# Patient Record
Sex: Female | Born: 1965 | Race: Black or African American | Hispanic: No | Marital: Single | Smoking: Never smoker
Health system: Southern US, Community
[De-identification: ages and names within clinical notes are randomized; demographics above are authoritative.]

## PROBLEM LIST (undated history)

## (undated) DIAGNOSIS — I10 Essential (primary) hypertension: Secondary | ICD-10-CM

## (undated) DIAGNOSIS — E7800 Pure hypercholesterolemia, unspecified: Secondary | ICD-10-CM

## (undated) DIAGNOSIS — E063 Autoimmune thyroiditis: Secondary | ICD-10-CM

## (undated) DIAGNOSIS — IMO0001 Reserved for inherently not codable concepts without codable children: Secondary | ICD-10-CM

## (undated) DIAGNOSIS — F329 Major depressive disorder, single episode, unspecified: Secondary | ICD-10-CM

## (undated) DIAGNOSIS — E109 Type 1 diabetes mellitus without complications: Secondary | ICD-10-CM

## (undated) DIAGNOSIS — M869 Osteomyelitis, unspecified: Secondary | ICD-10-CM

## (undated) DIAGNOSIS — E039 Hypothyroidism, unspecified: Secondary | ICD-10-CM

## (undated) DIAGNOSIS — Z5189 Encounter for other specified aftercare: Secondary | ICD-10-CM

## (undated) DIAGNOSIS — G4730 Sleep apnea, unspecified: Secondary | ICD-10-CM

## (undated) DIAGNOSIS — N182 Chronic kidney disease, stage 2 (mild): Secondary | ICD-10-CM

## (undated) DIAGNOSIS — D509 Iron deficiency anemia, unspecified: Secondary | ICD-10-CM

## (undated) DIAGNOSIS — M1990 Unspecified osteoarthritis, unspecified site: Secondary | ICD-10-CM

## (undated) DIAGNOSIS — R51 Headache: Secondary | ICD-10-CM

## (undated) DIAGNOSIS — M169 Osteoarthritis of hip, unspecified: Secondary | ICD-10-CM

## (undated) DIAGNOSIS — K219 Gastro-esophageal reflux disease without esophagitis: Secondary | ICD-10-CM

## (undated) DIAGNOSIS — M14679 Charcot's joint, unspecified ankle and foot: Secondary | ICD-10-CM

## (undated) DIAGNOSIS — I471 Supraventricular tachycardia, unspecified: Secondary | ICD-10-CM

## (undated) DIAGNOSIS — F419 Anxiety disorder, unspecified: Secondary | ICD-10-CM

## (undated) DIAGNOSIS — F32A Depression, unspecified: Secondary | ICD-10-CM

## (undated) DIAGNOSIS — G904 Autonomic dysreflexia: Secondary | ICD-10-CM

## (undated) DIAGNOSIS — G4733 Obstructive sleep apnea (adult) (pediatric): Secondary | ICD-10-CM

## (undated) DIAGNOSIS — E785 Hyperlipidemia, unspecified: Secondary | ICD-10-CM

## (undated) DIAGNOSIS — Z6841 Body Mass Index (BMI) 40.0 and over, adult: Secondary | ICD-10-CM

## (undated) DIAGNOSIS — Z9989 Dependence on other enabling machines and devices: Secondary | ICD-10-CM

## (undated) DIAGNOSIS — E079 Disorder of thyroid, unspecified: Secondary | ICD-10-CM

## (undated) DIAGNOSIS — N184 Chronic kidney disease, stage 4 (severe): Secondary | ICD-10-CM

## (undated) HISTORY — PX: CATARACT EXTRACTION W/ INTRAOCULAR LENS  IMPLANT, BILATERAL: SHX1307

## (undated) HISTORY — PX: LEG AMPUTATION BELOW KNEE: SHX694

## (undated) HISTORY — DX: Disorder of thyroid, unspecified: E07.9

## (undated) HISTORY — DX: Essential (primary) hypertension: I10

## (undated) HISTORY — DX: Depression, unspecified: F32.A

## (undated) HISTORY — PX: THYROIDECTOMY: SHX17

## (undated) HISTORY — PX: OTHER SURGICAL HISTORY: SHX169

---

## 1991-03-03 DIAGNOSIS — M869 Osteomyelitis, unspecified: Secondary | ICD-10-CM

## 1991-03-03 HISTORY — DX: Osteomyelitis, unspecified: M86.9

## 1991-03-03 HISTORY — PX: BELOW KNEE LEG AMPUTATION: SUR23

## 1995-03-03 DIAGNOSIS — E063 Autoimmune thyroiditis: Secondary | ICD-10-CM

## 1995-03-03 HISTORY — DX: Autoimmune thyroiditis: E06.3

## 1995-03-03 HISTORY — PX: THYROIDECTOMY: SHX17

## 1996-03-02 HISTORY — PX: OTHER SURGICAL HISTORY: SHX169

## 1998-03-02 HISTORY — PX: BREAST LUMPECTOMY: SHX2

## 2000-03-02 DIAGNOSIS — I471 Supraventricular tachycardia, unspecified: Secondary | ICD-10-CM

## 2000-03-02 HISTORY — DX: Supraventricular tachycardia, unspecified: I47.10

## 2000-03-02 HISTORY — PX: SUPRAVENTRICULAR TACHYCARDIA ABLATION: SHX6106

## 2000-03-02 HISTORY — DX: Supraventricular tachycardia: I47.1

## 2001-03-02 HISTORY — PX: CARPAL TUNNEL RELEASE: SHX101

## 2002-03-02 HISTORY — PX: RETINAL DETACHMENT REPAIR W/ SCLERAL BUCKLE LE: SHX2338

## 2007-10-24 ENCOUNTER — Ambulatory Visit: Payer: Self-pay | Admitting: Licensed Clinical Social Worker

## 2007-10-25 ENCOUNTER — Ambulatory Visit: Payer: Self-pay | Admitting: Endocrinology

## 2007-10-25 DIAGNOSIS — E063 Autoimmune thyroiditis: Secondary | ICD-10-CM

## 2007-10-25 DIAGNOSIS — S88119A Complete traumatic amputation at level between knee and ankle, unspecified lower leg, initial encounter: Secondary | ICD-10-CM | POA: Insufficient documentation

## 2007-10-25 DIAGNOSIS — S82409A Unspecified fracture of shaft of unspecified fibula, initial encounter for closed fracture: Secondary | ICD-10-CM | POA: Insufficient documentation

## 2007-10-25 DIAGNOSIS — I498 Other specified cardiac arrhythmias: Secondary | ICD-10-CM

## 2007-10-25 DIAGNOSIS — L089 Local infection of the skin and subcutaneous tissue, unspecified: Secondary | ICD-10-CM | POA: Insufficient documentation

## 2007-10-25 LAB — CONVERTED CEMR LAB
ALT: 27 units/L (ref 0–35)
Albumin: 3.6 g/dL (ref 3.5–5.2)
Calcium: 9.2 mg/dL (ref 8.4–10.5)
Chloride: 111 meq/L (ref 96–112)
Creatinine, Ser: 1.9 mg/dL — ABNORMAL HIGH (ref 0.4–1.2)
GFR calc Af Amer: 37 mL/min
GFR calc non Af Amer: 31 mL/min
Hgb A1c MFr Bld: 7.6 % — ABNORMAL HIGH (ref 4.6–6.0)
Potassium: 4.5 meq/L (ref 3.5–5.1)
Total CHOL/HDL Ratio: 4.1
Total Protein: 8 g/dL (ref 6.0–8.3)

## 2007-10-30 DIAGNOSIS — F329 Major depressive disorder, single episode, unspecified: Secondary | ICD-10-CM

## 2007-10-30 DIAGNOSIS — E1140 Type 2 diabetes mellitus with diabetic neuropathy, unspecified: Secondary | ICD-10-CM

## 2007-10-30 DIAGNOSIS — E785 Hyperlipidemia, unspecified: Secondary | ICD-10-CM

## 2007-10-30 DIAGNOSIS — M1990 Unspecified osteoarthritis, unspecified site: Secondary | ICD-10-CM | POA: Insufficient documentation

## 2007-10-30 DIAGNOSIS — E039 Hypothyroidism, unspecified: Secondary | ICD-10-CM | POA: Insufficient documentation

## 2007-10-30 DIAGNOSIS — M1460 Charcot's joint, unspecified site: Secondary | ICD-10-CM | POA: Insufficient documentation

## 2007-10-30 DIAGNOSIS — I119 Hypertensive heart disease without heart failure: Secondary | ICD-10-CM

## 2007-10-30 DIAGNOSIS — E108 Type 1 diabetes mellitus with unspecified complications: Secondary | ICD-10-CM

## 2007-10-30 DIAGNOSIS — F3289 Other specified depressive episodes: Secondary | ICD-10-CM | POA: Insufficient documentation

## 2007-11-04 ENCOUNTER — Ambulatory Visit: Payer: Self-pay | Admitting: Licensed Clinical Social Worker

## 2007-11-10 ENCOUNTER — Encounter: Payer: Self-pay | Admitting: Endocrinology

## 2007-11-16 ENCOUNTER — Telehealth: Payer: Self-pay | Admitting: Endocrinology

## 2007-11-18 ENCOUNTER — Ambulatory Visit: Payer: Self-pay | Admitting: Licensed Clinical Social Worker

## 2007-11-24 ENCOUNTER — Ambulatory Visit: Payer: Self-pay | Admitting: Endocrinology

## 2007-12-05 ENCOUNTER — Encounter: Payer: Self-pay | Admitting: Endocrinology

## 2008-02-09 ENCOUNTER — Encounter: Payer: Self-pay | Admitting: Endocrinology

## 2008-02-14 ENCOUNTER — Telehealth (INDEPENDENT_AMBULATORY_CARE_PROVIDER_SITE_OTHER): Payer: Self-pay | Admitting: *Deleted

## 2008-03-08 ENCOUNTER — Ambulatory Visit: Payer: Self-pay | Admitting: Endocrinology

## 2008-03-08 LAB — CONVERTED CEMR LAB: Hgb A1c MFr Bld: 8 % — ABNORMAL HIGH (ref 4.6–6.0)

## 2008-03-30 ENCOUNTER — Encounter: Payer: Self-pay | Admitting: Endocrinology

## 2008-04-13 ENCOUNTER — Emergency Department (HOSPITAL_COMMUNITY): Admission: EM | Admit: 2008-04-13 | Discharge: 2008-04-14 | Payer: Self-pay | Admitting: Emergency Medicine

## 2008-05-29 ENCOUNTER — Encounter: Admission: RE | Admit: 2008-05-29 | Discharge: 2008-05-29 | Payer: Self-pay | Admitting: Neurology

## 2008-06-23 ENCOUNTER — Encounter: Admission: RE | Admit: 2008-06-23 | Discharge: 2008-06-23 | Payer: Self-pay | Admitting: Sports Medicine

## 2008-06-29 ENCOUNTER — Encounter: Payer: Self-pay | Admitting: Neurology

## 2008-09-07 ENCOUNTER — Encounter: Admission: RE | Admit: 2008-09-07 | Discharge: 2008-09-07 | Payer: Self-pay | Admitting: Obstetrics and Gynecology

## 2008-10-07 ENCOUNTER — Inpatient Hospital Stay (HOSPITAL_COMMUNITY): Admission: EM | Admit: 2008-10-07 | Discharge: 2008-10-09 | Payer: Self-pay | Admitting: Emergency Medicine

## 2008-12-04 ENCOUNTER — Telehealth (INDEPENDENT_AMBULATORY_CARE_PROVIDER_SITE_OTHER): Payer: Self-pay | Admitting: *Deleted

## 2009-03-02 HISTORY — PX: JOINT REPLACEMENT: SHX530

## 2009-03-02 HISTORY — PX: TOTAL HIP ARTHROPLASTY: SHX124

## 2009-03-11 ENCOUNTER — Telehealth: Payer: Self-pay | Admitting: Endocrinology

## 2009-04-16 ENCOUNTER — Encounter: Payer: Self-pay | Admitting: Endocrinology

## 2009-06-04 ENCOUNTER — Encounter: Admission: RE | Admit: 2009-06-04 | Discharge: 2009-06-04 | Payer: Self-pay | Admitting: Internal Medicine

## 2009-06-25 ENCOUNTER — Telehealth: Payer: Self-pay | Admitting: Endocrinology

## 2009-07-01 ENCOUNTER — Encounter: Payer: Self-pay | Admitting: Endocrinology

## 2009-08-12 ENCOUNTER — Inpatient Hospital Stay (HOSPITAL_COMMUNITY): Admission: RE | Admit: 2009-08-12 | Discharge: 2009-08-16 | Payer: Self-pay | Admitting: Orthopedic Surgery

## 2009-10-30 ENCOUNTER — Telehealth: Payer: Self-pay | Admitting: Endocrinology

## 2010-01-10 ENCOUNTER — Ambulatory Visit (HOSPITAL_COMMUNITY): Admission: RE | Admit: 2010-01-10 | Discharge: 2010-01-10 | Payer: Self-pay | Admitting: Obstetrics & Gynecology

## 2010-03-02 HISTORY — PX: CERVICAL POLYPECTOMY: SHX88

## 2010-03-24 ENCOUNTER — Encounter: Payer: Self-pay | Admitting: Interventional Radiology

## 2010-03-27 NOTE — Op Note (Addendum)
  NAME:  [name], [name]                ACCOUNT NO.:  [id number]  MEDICAL RECORD NO.:  [id number]          PATIENT TYPE:  AMB  LOCATION:  SDC                           FACILITY:  [facility]  PHYSICIAN:  Freddy Finner, M.D.   DATE OF BIRTH:  11/30/1965  DATE OF PROCEDURE:  01/10/2010 DATE OF DISCHARGE:  01/10/2010                              OPERATIVE REPORT   PREOPERATIVE DIAGNOSIS:  Postmenopausal bleeding, endometrial thickening.  POSTOPERATIVE DIAGNOSIS:  Endometrial polyps.  OPERATIVE PROCEDURE:  Hysteroscopy D and C, resection of endometrial polyps.  SURGEON:  Freddy Finner, MD  ANESTHESIA:  General.  ESTIMATED INTRAOPERATIVE BLOOD LOSS:  10 mL.  SORBITOL DEFICIT:  30 mL.  INTRAOPERATIVE COMPLICATIONS:  None.  The patient is a 45 year old who has had an episode of postmenopausal bleeding and was found on ultrasound in the office to have endometrial thickening.  She is admitted now for hysteroscopy D and C.  She was given a bolus of Ancef preoperatively.  She was brought to the operating room.  She was there placed under adequate general anesthesia, placed in dorsal lithotomy position.  Betadine prep of the mons, perineum, and vagina was carried out in the usual fashion.  Sterile drapes were applied.  Pederson vaginal speculum was introduced.  Cervix was visualized, grasped on the anterior lip with a single-tooth tenaculum. An os finder was then used to dilate the cervix and checked the uterine length which was 9 cm.  Cervix was grossly dilated to 23 with [facility] dilators.  A curettage was performed initially because of some concern that we could complete the hysteroscopy and wanting to have a tissue sample.  The procedure was technically difficult because of nulliparous cervix and vaginal and cervical atrophy or infantile appearance.  An approximately 2 cm x 5 mm polyp was extracted.  The hysteroscope was then introduced and successfully without complication and another  polyp was identified which was eventually removed using a sharp curette and the Randall stone forceps.  Repeat inspection revealed resection of all visible polyps.  Photographs were made and retained in the office record.  The procedure was then terminated.  The instruments were removed.  The patient was awakened and taken to recovery room in good condition.     Freddy Finner, M.D.     WRN/MEDQ  D:  01/10/2010  T:  01/11/2010  Job:  [id number]  Electronically Signed by Lalitha Ilyas. Dorice Stiggers M.D. on 03/27/2010 08:51:59 AM

## 2010-04-01 NOTE — Progress Notes (Signed)
  Phone Note Refill Request Message from:  Fax from Pharmacy on March 11, 2009 11:35 AM  Refills Requested: Medication #1:  ONETOUCH ULTRA TEST  STRP 6/day   Dosage confirmed as above?Dosage Confirmed Initial call taken by: Josph Macho CMA,  March 11, 2009 11:35 AM    Prescriptions: [place]n Bound TEST  STRP (GLUCOSE BLOOD) 6/day, variable glucoses, 250.03, insulin pump (and lancets)  #200 Each x 3   Entered by:   Josph Macho CMA   Authorized by:   Minus Breeding MD   Signed by:   Josph Macho CMA on 03/11/2009   Method used:   Electronically to        [facility]  [phone]* (retail)       [address]       [place], [place]  [address]       Ph: [phone] or [phone]       Fax: [phone]   RxID:   [id number]

## 2010-04-01 NOTE — Letter (Signed)
Summary: Generic Letter  [facility]  [address] [place], [place] [address]   Phone: [phone]  Fax: [phone]    07/01/2009  [name] [address] [place], [place]  [address]  Dear Ms. [name],    Please contact our office. Dr Everardo All is requesting that you schedule a follow up appointment soon to be able to provide you with the best care. If you have any questions about this please call the office.       Sincerely,   Lamar Sprinkles, CMA (AAMA)

## 2010-04-01 NOTE — Progress Notes (Signed)
Summary: test strip pa  Phone Note From Pharmacy   Details of Request: [facility] [phone] (Not available online.) Summary of Call: PA request--oen touch test strips. Patient dirrections state to use 6x/day, would you like patient to continue that regimen? Please advise. Initial call taken by: Lucious Groves,  June 25, 2009 11:16 AM  Follow-up for Phone Call        it has been 1 1/2 years since last ov. ov would be needed to consider Follow-up by: Minus Breeding MD,  June 25, 2009 12:39 PM  Additional Follow-up for Phone Call Additional follow up Details #1::        left message on machine to call back to office for appt. Additional Follow-up by: Lucious Groves,  June 25, 2009 4:46 PM    Additional Follow-up for Phone Call Additional follow up Details #2::    left mess to call office back. will mail letter today Follow-up by: Lamar Sprinkles, CMA,  Jul 01, 2009 11:48 AM

## 2010-04-01 NOTE — Letter (Signed)
Summary: Date Range:06-19-08 to 04-16-09/[facility]  Date Range:06-19-08 to 04-16-09/[facility]   Imported By: Sherian Rein 04/23/2009 13:17:13  _____________________________________________________________________  External Attachment:    Type:   Image     Comment:   External Document

## 2010-04-01 NOTE — Progress Notes (Signed)
Summary: OV due  Phone Note Outgoing Call Call back at [facility] Phone [phone] Call back at Work Phone [phone]   Call placed by: Brenton Grills MA,  October 30, 2009 2:21 PM Details for Reason: OV due Summary of Call: Per MD, pt is due for OV. Pt hasn't been seen since January 2010. Left message on VM to callback office  Follow-up for Phone Call        left VM for pt to callback office Follow-up by: Brenton Grills MA,  October 31, 2009 4:50 PM  Additional Follow-up for Phone Call Additional follow up Details #1::        left message for pt to callback office  left message for pt to callback office Brenton Grills MA  November 07, 2009 8:46 AM   unable to reach pt by phone. will send letter today. Brenton Grills MA  November 19, 2009 9:01 AM  Additional Follow-up by: Brenton Grills MA,  November 05, 2009 1:58 PM    Additional Follow-up for Phone Call Additional follow up Details #2::    left message for pt to callback  Follow-up by: Brenton Grills MA,  November 08, 2009 9:04 AM

## 2010-05-13 LAB — CBC
HCT: 34.6 % — ABNORMAL LOW (ref 36.0–46.0)
Hemoglobin: 11.2 g/dL — ABNORMAL LOW (ref 12.0–15.0)
MCHC: 32.3 g/dL (ref 30.0–36.0)
RBC: 4.96 MIL/uL (ref 3.87–5.11)
RDW: 14.2 % (ref 11.5–15.5)
WBC: 5 10*3/uL (ref 4.0–10.5)

## 2010-05-13 LAB — BASIC METABOLIC PANEL
Calcium: 9.1 mg/dL (ref 8.4–10.5)
Creatinine, Ser: 1.95 mg/dL — ABNORMAL HIGH (ref 0.4–1.2)
GFR calc Af Amer: 33 mL/min — ABNORMAL LOW (ref >60)
GFR calc non Af Amer: 28 mL/min — ABNORMAL LOW (ref >60)
Potassium: 4.8 mEq/L (ref 3.5–5.1)
Sodium: 140 mEq/L (ref 135–145)

## 2010-05-13 LAB — GLUCOSE, CAPILLARY: Glucose-Capillary: 127 mg/dL — ABNORMAL HIGH (ref 70–99)

## 2010-05-18 LAB — URINALYSIS, ROUTINE W REFLEX MICROSCOPIC
Bilirubin Urine: NEGATIVE
Ketones, ur: NEGATIVE mg/dL
Leukocytes, UA: NEGATIVE
Protein, ur: 30 mg/dL — AB
Specific Gravity, Urine: 1.017 (ref 1.005–1.030)
pH: 5 (ref 5.0–8.0)

## 2010-05-18 LAB — BASIC METABOLIC PANEL
BUN: 16 mg/dL (ref 6–23)
BUN: 18 mg/dL (ref 6–23)
CO2: 26 mEq/L (ref 19–32)
Calcium: 7.7 mg/dL — ABNORMAL LOW (ref 8.4–10.5)
Calcium: 7.8 mg/dL — ABNORMAL LOW (ref 8.4–10.5)
Chloride: 105 mEq/L (ref 96–112)
Creatinine, Ser: 1.45 mg/dL — ABNORMAL HIGH (ref 0.4–1.2)
Creatinine, Ser: 1.55 mg/dL — ABNORMAL HIGH (ref 0.4–1.2)
GFR calc Af Amer: 44 mL/min — ABNORMAL LOW (ref >60)
Glucose, Bld: 125 mg/dL — ABNORMAL HIGH (ref 70–99)
Glucose, Bld: 200 mg/dL — ABNORMAL HIGH (ref 70–99)
Potassium: 4.2 mEq/L (ref 3.5–5.1)

## 2010-05-18 LAB — HEMOGLOBIN AND HEMATOCRIT, BLOOD
HCT: 24.2 % — ABNORMAL LOW (ref 36.0–46.0)
Hemoglobin: 7.7 g/dL — ABNORMAL LOW (ref 12.0–15.0)

## 2010-05-18 LAB — GLUCOSE, CAPILLARY
Glucose-Capillary: 168 mg/dL — ABNORMAL HIGH (ref 70–99)
Glucose-Capillary: 172 mg/dL — ABNORMAL HIGH (ref 70–99)
Glucose-Capillary: 188 mg/dL — ABNORMAL HIGH (ref 70–99)
Glucose-Capillary: 224 mg/dL — ABNORMAL HIGH (ref 70–99)
Glucose-Capillary: 239 mg/dL — ABNORMAL HIGH (ref 70–99)

## 2010-05-18 LAB — CBC
HCT: 23.5 % — ABNORMAL LOW (ref 36.0–46.0)
RDW: 14.5 % (ref 11.5–15.5)

## 2010-05-18 LAB — URINE MICROSCOPIC-ADD ON

## 2010-05-19 LAB — GLUCOSE, CAPILLARY
Glucose-Capillary: 117 mg/dL — ABNORMAL HIGH (ref 70–99)
Glucose-Capillary: 141 mg/dL — ABNORMAL HIGH (ref 70–99)
Glucose-Capillary: 144 mg/dL — ABNORMAL HIGH (ref 70–99)
Glucose-Capillary: 151 mg/dL — ABNORMAL HIGH (ref 70–99)
Glucose-Capillary: 178 mg/dL — ABNORMAL HIGH (ref 70–99)
Glucose-Capillary: 227 mg/dL — ABNORMAL HIGH (ref 70–99)
Glucose-Capillary: 233 mg/dL — ABNORMAL HIGH (ref 70–99)
Glucose-Capillary: 246 mg/dL — ABNORMAL HIGH (ref 70–99)
Glucose-Capillary: 71 mg/dL (ref 70–99)

## 2010-05-19 LAB — DIFFERENTIAL
Basophils Absolute: 0 10*3/uL (ref 0.0–0.1)
Eosinophils Relative: 0 % (ref 0–5)
Lymphocytes Relative: 44 % (ref 12–46)
Neutro Abs: 2.4 10*3/uL (ref 1.7–7.7)
Neutrophils Relative %: 49 % (ref 43–77)

## 2010-05-19 LAB — CBC
HCT: 33 % — ABNORMAL LOW (ref 36.0–46.0)
Hemoglobin: 8.2 g/dL — ABNORMAL LOW (ref 12.0–15.0)
MCHC: 32.1 g/dL (ref 30.0–36.0)
Platelets: 193 10*3/uL (ref 150–400)
Platelets: 312 10*3/uL (ref 150–400)
RDW: 14.4 % (ref 11.5–15.5)
RDW: 14.8 % (ref 11.5–15.5)
WBC: 4.8 10*3/uL (ref 4.0–10.5)

## 2010-05-19 LAB — TYPE AND SCREEN

## 2010-05-19 LAB — URINALYSIS, ROUTINE W REFLEX MICROSCOPIC
Glucose, UA: NEGATIVE mg/dL
Ketones, ur: NEGATIVE mg/dL
Protein, ur: NEGATIVE mg/dL
Urobilinogen, UA: 0.2 mg/dL (ref 0.0–1.0)

## 2010-05-19 LAB — BASIC METABOLIC PANEL
BUN: 23 mg/dL (ref 6–23)
BUN: 47 mg/dL — ABNORMAL HIGH (ref 6–23)
CO2: 28 mEq/L (ref 19–32)
Calcium: 8.1 mg/dL — ABNORMAL LOW (ref 8.4–10.5)
Calcium: 8.9 mg/dL (ref 8.4–10.5)
Creatinine, Ser: 1.34 mg/dL — ABNORMAL HIGH (ref 0.4–1.2)
Creatinine, Ser: 1.97 mg/dL — ABNORMAL HIGH (ref 0.4–1.2)
GFR calc non Af Amer: 27 mL/min — ABNORMAL LOW (ref >60)
GFR calc non Af Amer: 43 mL/min — ABNORMAL LOW (ref >60)
Glucose, Bld: 96 mg/dL (ref 70–99)
Glucose, Bld: 99 mg/dL (ref 70–99)
Sodium: 139 mEq/L (ref 135–145)

## 2010-05-19 LAB — PROTIME-INR
INR: 1.06 (ref 0.00–1.49)
Prothrombin Time: 13.7 seconds (ref 11.6–15.2)

## 2010-05-19 LAB — SURGICAL PCR SCREEN
MRSA, PCR: NEGATIVE
Staphylococcus aureus: POSITIVE — AB

## 2010-05-19 LAB — HEMOGLOBIN AND HEMATOCRIT, BLOOD: HCT: 27.6 % — ABNORMAL LOW (ref 36.0–46.0)

## 2010-06-07 LAB — URINE MICROSCOPIC-ADD ON

## 2010-06-07 LAB — PHOSPHORUS
Phosphorus: 4.3 mg/dL (ref 2.3–4.6)
Phosphorus: 4.6 mg/dL (ref 2.3–4.6)

## 2010-06-07 LAB — DIFFERENTIAL
Basophils Absolute: 0 K/uL (ref 0.0–0.1)
Basophils Relative: 0 % (ref 0–1)
Eosinophils Absolute: 0 K/uL (ref 0.0–0.7)
Eosinophils Relative: 0 % (ref 0–5)
Eosinophils Relative: 0 % (ref 0–5)
Lymphocytes Relative: 30 % (ref 12–46)
Lymphocytes Relative: 34 % (ref 12–46)
Lymphs Abs: 1.8 10*3/uL (ref 0.7–4.0)
Lymphs Abs: 2.3 K/uL (ref 0.7–4.0)
Monocytes Absolute: 0.7 K/uL (ref 0.1–1.0)
Monocytes Relative: 11 % (ref 3–12)
Monocytes Relative: 7 % (ref 3–12)
Neutro Abs: 3.7 K/uL (ref 1.7–7.7)
Neutrophils Relative %: 55 % (ref 43–77)
Neutrophils Relative %: 63 % (ref 43–77)

## 2010-06-07 LAB — BASIC METABOLIC PANEL
BUN: 64 mg/dL — ABNORMAL HIGH (ref 6–23)
BUN: 69 mg/dL — ABNORMAL HIGH (ref 6–23)
BUN: 74 mg/dL — ABNORMAL HIGH (ref 6–23)
BUN: 79 mg/dL — ABNORMAL HIGH (ref 6–23)
BUN: 89 mg/dL — ABNORMAL HIGH (ref 6–23)
CO2: 18 mEq/L — ABNORMAL LOW (ref 19–32)
CO2: 22 mEq/L (ref 19–32)
Calcium: 8.8 mg/dL (ref 8.4–10.5)
Calcium: 8.9 mg/dL (ref 8.4–10.5)
Calcium: 9 mg/dL (ref 8.4–10.5)
Chloride: 102 mEq/L (ref 96–112)
Chloride: 107 mEq/L (ref 96–112)
Chloride: 109 mEq/L (ref 96–112)
Chloride: 109 mEq/L (ref 96–112)
Chloride: 112 mEq/L (ref 96–112)
Creatinine, Ser: 1.95 mg/dL — ABNORMAL HIGH (ref 0.4–1.2)
Creatinine, Ser: 1.98 mg/dL — ABNORMAL HIGH (ref 0.4–1.2)
Creatinine, Ser: 2.12 mg/dL — ABNORMAL HIGH (ref 0.4–1.2)
Creatinine, Ser: 2.17 mg/dL — ABNORMAL HIGH (ref 0.4–1.2)
GFR calc Af Amer: 24 mL/min — ABNORMAL LOW (ref >60)
GFR calc Af Amer: 34 mL/min — ABNORMAL LOW (ref >60)
GFR calc Af Amer: 40 mL/min — ABNORMAL LOW (ref >60)
GFR calc non Af Amer: 20 mL/min — ABNORMAL LOW (ref >60)
GFR calc non Af Amer: 25 mL/min — ABNORMAL LOW (ref >60)
GFR calc non Af Amer: 25 mL/min — ABNORMAL LOW (ref >60)
Glucose, Bld: 228 mg/dL — ABNORMAL HIGH (ref 70–99)
Glucose, Bld: 400 mg/dL — ABNORMAL HIGH (ref 70–99)
Glucose, Bld: 89 mg/dL (ref 70–99)
Potassium: 4.4 mEq/L (ref 3.5–5.1)
Potassium: 4.8 mEq/L (ref 3.5–5.1)
Potassium: 5 mEq/L (ref 3.5–5.1)
Potassium: 7.1 mEq/L (ref 3.5–5.1)
Sodium: 139 mEq/L (ref 135–145)

## 2010-06-07 LAB — POCT I-STAT 3, ART BLOOD GAS (G3+)
Acid-base deficit: 9 mmol/L — ABNORMAL HIGH (ref 0.0–2.0)
Bicarbonate: 16.4 mEq/L — ABNORMAL LOW (ref 20.0–24.0)
O2 Saturation: 96 %
TCO2: 17 mmol/L (ref 0–100)
pO2, Arterial: 88 mmHg (ref 80.0–100.0)

## 2010-06-07 LAB — CBC
HCT: 29.6 % — ABNORMAL LOW (ref 36.0–46.0)
HCT: 30.3 % — ABNORMAL LOW (ref 36.0–46.0)
HCT: 32.9 % — ABNORMAL LOW (ref 36.0–46.0)
Hemoglobin: 9.6 g/dL — ABNORMAL LOW (ref 12.0–15.0)
Hemoglobin: 9.8 g/dL — ABNORMAL LOW (ref 12.0–15.0)
MCHC: 32.2 g/dL (ref 30.0–36.0)
MCHC: 32.6 g/dL (ref 30.0–36.0)
MCV: 70.7 fL — ABNORMAL LOW (ref 78.0–100.0)
MCV: 71.9 fL — ABNORMAL LOW (ref 78.0–100.0)
MCV: 72.2 fL — ABNORMAL LOW (ref 78.0–100.0)
Platelets: 241 K/uL (ref 150–400)
Platelets: 251 K/uL (ref 150–400)
Platelets: 279 10*3/uL (ref 150–400)
RBC: 4.09 MIL/uL (ref 3.87–5.11)
RBC: 4.22 MIL/uL (ref 3.87–5.11)
RBC: 4.66 MIL/uL (ref 3.87–5.11)
RDW: 12.8 % (ref 11.5–15.5)
RDW: 13.2 % (ref 11.5–15.5)
WBC: 4.7 K/uL (ref 4.0–10.5)
WBC: 6.1 10*3/uL (ref 4.0–10.5)
WBC: 6.7 K/uL (ref 4.0–10.5)

## 2010-06-07 LAB — URINE CULTURE

## 2010-06-07 LAB — LIPID PANEL
HDL: 46 mg/dL (ref >39)
HDL: 49 mg/dL (ref >39)
LDL Cholesterol: 78 mg/dL (ref 0–99)
LDL Cholesterol: 97 mg/dL (ref 0–99)
Total CHOL/HDL Ratio: 3.3 RATIO
Triglycerides: 139 mg/dL (ref <150)
VLDL: 7 mg/dL (ref 0–40)

## 2010-06-07 LAB — GLUCOSE, CAPILLARY
Glucose-Capillary: 104 mg/dL — ABNORMAL HIGH (ref 70–99)
Glucose-Capillary: 111 mg/dL — ABNORMAL HIGH (ref 70–99)
Glucose-Capillary: 136 mg/dL — ABNORMAL HIGH (ref 70–99)
Glucose-Capillary: 137 mg/dL — ABNORMAL HIGH (ref 70–99)
Glucose-Capillary: 209 mg/dL — ABNORMAL HIGH (ref 70–99)
Glucose-Capillary: 296 mg/dL — ABNORMAL HIGH (ref 70–99)
Glucose-Capillary: 299 mg/dL — ABNORMAL HIGH (ref 70–99)
Glucose-Capillary: 339 mg/dL — ABNORMAL HIGH (ref 70–99)
Glucose-Capillary: 344 mg/dL — ABNORMAL HIGH (ref 70–99)
Glucose-Capillary: 55 mg/dL — ABNORMAL LOW (ref 70–99)
Glucose-Capillary: 576 mg/dL (ref 70–99)
Glucose-Capillary: 71 mg/dL (ref 70–99)
Glucose-Capillary: 73 mg/dL (ref 70–99)
Glucose-Capillary: 75 mg/dL (ref 70–99)
Glucose-Capillary: 76 mg/dL (ref 70–99)

## 2010-06-07 LAB — URINALYSIS, ROUTINE W REFLEX MICROSCOPIC
Glucose, UA: >1000 mg/dL — AB
Hgb urine dipstick: NEGATIVE
Leukocytes, UA: NEGATIVE
pH: 5 (ref 5.0–8.0)

## 2010-06-07 LAB — CARDIAC PANEL(CRET KIN+CKTOT+MB+TROPI)
CK, MB: 4.1 ng/mL — ABNORMAL HIGH (ref 0.3–4.0)
Relative Index: 2.3 (ref 0.0–2.5)
Total CK: 177 U/L (ref 7–177)
Troponin I: 0.02 ng/mL (ref 0.00–0.06)

## 2010-06-07 LAB — HEMOCCULT GUIAC POC 1CARD (OFFICE): Fecal Occult Bld: NEGATIVE

## 2010-06-07 LAB — MAGNESIUM
Magnesium: 2.1 mg/dL (ref 1.5–2.5)
Magnesium: 2.2 mg/dL (ref 1.5–2.5)
Magnesium: 2.2 mg/dL (ref 1.5–2.5)

## 2010-06-07 LAB — CK TOTAL AND CKMB (NOT AT ARMC)
CK, MB: 4.7 ng/mL — ABNORMAL HIGH (ref 0.3–4.0)
Total CK: 199 U/L — ABNORMAL HIGH (ref 7–177)

## 2010-06-07 LAB — KETONES, QUALITATIVE: Acetone, Bld: NEGATIVE

## 2010-06-07 LAB — TROPONIN I: Troponin I: 0.01 ng/mL (ref 0.00–0.06)

## 2010-06-07 LAB — POTASSIUM: Potassium: 5.1 mEq/L (ref 3.5–5.1)

## 2010-07-11 ENCOUNTER — Ambulatory Visit (HOSPITAL_COMMUNITY)
Admission: RE | Admit: 2010-07-11 | Discharge: 2010-07-11 | Disposition: A | Discharge status: Home / Self Care | Payer: BC Managed Care – PPO | Source: Ambulatory Visit | Attending: Gastroenterology | Admitting: Gastroenterology

## 2010-07-11 DIAGNOSIS — R198 Other specified symptoms and signs involving the digestive system and abdomen: Secondary | ICD-10-CM | POA: Insufficient documentation

## 2010-07-15 NOTE — H&P (Signed)
[name], [name]                ACCOUNT NO.:  [id number]   MEDICAL RECORD NO.:  [id number]          PATIENT TYPE:  INP   LOCATION:  4732                         FACILITY:  [facility]   PHYSICIAN:  Arne Cleveland, MD       DATE OF BIRTH:  1965-10-10   DATE OF ADMISSION:  10/07/2008  DATE OF DISCHARGE:                              HISTORY & PHYSICAL   PRIMARY CARE PHYSICIAN:  Merlene Laughter. Renae Gloss, M.D.   The patient is being admitted to [facility] via D Team.   CHIEF COMPLAINT:  High blood sugar.   HISTORY OF PRESENT ILLNESS:  Ms. Dargis is a 45 year old Afro-American  female who had been doing fairly well until yesterday when she had an  episode and what she found was a low blood sugar, became lightheaded,  dizzy, having some chills.  Ate some candy, checked her sugar, was 110.  Went home, ate; sugar was still around 100 so she had dinner.  Felt  better and felt like she might have had a low blood sugar episode.  She  has a history of type 1 diabetes and has had diabetes for 35 years and  has insulin pump.  She states she checks her sugars 4-6 times a day, and  her sugars have been doing fairly well until yesterday.  Today at  church, she began feeling very dizzy, lightheaded, felt her heart was  racing.  Checked her sugar and it was high.  She states that she dosed  her insulin pump down.  She was hoping that her symptoms would be better  but they did not and she became lightheaded, weak, had nausea and  vomiting, and they called paramedics and brought her to the emergency  room.  The patient denies any fever, chills or night sweats, or any type  of infection that she had noticed.  She denies any burning with  urination, cough, diarrhea, any unusual rash, or any vaginal discharge.  She said the onset was abrupt and prior to the low sugar episode  yesterday and the high sugar episode today, the patient states she had  been feeling fairly well.   PAST MEDICAL HISTORY:   Significant for hypertension, type 1 diabetes,  hyperlipidemia, and hypothyroidism.  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  She has had thyroid surgery for Hashimoto  thyroiditis and the approach was thoracic and she has had bilateral  cataract surgery as well as retinal detachment surgery on the left eye.  She has diabetic retinopathy and diabetic nephropathy but the  nephropathy is mild.   SOCIAL HISTORY:  She is single.  She has got good family support.  She  does not smoke or use drugs.  She drinks on rare social occasion.  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   ALLERGIES:  She has allergies to TRAVATAN eye drops.   MEDICATIONS:  She is on  an insulin pump.  She takes aspirin, Wellbutrin,  Lipitor, Claritin, levothyroxine, lisinopril, and felodipine.  We do not  have the dosages of those medicines.   FAMILY HISTORY:  Both parents have diabetes type 2.  Father had renal  failure but died of cardiac arrest.  Her mother has type 2 diabetes,  hypertension.   LABORATORIES:  Initial blood sugar in the emergency room was 580.  Sodium 131 mg/L, potassium 7.1 mEq/L, chloride 102 mEq/L, CO2 of 15,  glucose 580 mg/dL.  BUN 89 mg/dL, creatinine 1.61 mg/dL, calcium 9.4.  The patient's blood gas initially pH of 7.32, PCO2 of 31.5, PO2 of 88,  bicarb 16.4, O2 sat 98%.  White count is 6100, hemoglobin 11, hematocrit  32.9 with normal differential.  Urinalysis is negative on dip.  She has  0-2 wbcs per high-powered field, few bacteria.  Chest x-rays shows no  acute cardiopulmonary disease.  EKG shows sinus tachycardia, kind of  peak T waves.  The patient was treated with IV fluids.  She has received  total of 2 L and subcu insulin, she has received initially 15 units.  Her repeat potassium was 5.1 and repeat glucose is 336.   The patient is  feeling a little better but still is tachypneic.   PHYSICAL EXAMINATION:  VITAL SIGNS:  Blood pressure 139/70, temperature  98, pulse 104, respirations 20, pulse ox 98%.  GENERAL:  She is a obese, well-developed, well-nourished Afro-American  female who is feeling better after receiving initial treatment in the  emergency room but still is slightly nauseated and tachypneic.  HEENT:  Examination of her head is atraumatic, normocephalic.  External  ear canal and tympanic membrane appear normal.  Oropharynx:  Mucous  membranes are dry.  There is no erythema or lesion noted.  NECK:  Supple without jugular venous distention.  She has had thyroid  surgery but it was thoracic approach and there is no jugular venous  distention.  Neck is supple.  CHEST:  Normal to inspection and palpation.  LUNGS:  Clear to auscultation.  HEART:  Rate is around 110.  Rhythm is regular without murmur, gallop,  or rub.  ABDOMEN:  Obese, soft, nontender with normoactive bowel sounds.  No  hepatosplenomegaly.  No splenomegaly.  No palpable mass.  BREAST:  There is no palpable mass.  PELVIC:  Deferred at the patient's request.  RECTAL:  Stool was hemoccult negative.  EXTREMITIES:  She has a left BKA and right foot surgery with  transmetatarsal resection leaving her without toes.  SKIN:  There are a lot of changes on her skin on her arms and legs but  there is no evidence of any acute infection or rash.  NEUROLOGIC:  She is awake, alert, and oriented x3.  Cranial nerves II  through XII intact.  Motor strength 5/5 in upper extremities and lower  extremities.  Sensory:  She has decreased sensation from her legs, mid  thighs down.  Reflexes 0-1 in the upper extremities and the left lower  extremity.   IMPRESSION:  My impression is:  1. Mild diabetic ketoacidosis.  2. Hyperkalemia, electrolyte imbalance secondary to her diabetic      ketoacidosis.  3. Dehydration.  4. Renal insufficiency.    Etiology of her DK is not known.  No evidence of any infectious etiology  or stress etiology to her diabetic ketoacidosis.  The patient states  that her sugars had been well controlled and that she checks her sugars  quite often.  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   PLAN:  My plan is to continue IV hydration.  Continue to monitor her  electrolytes, potassium, sodium, magnesium, and phosphorous.  The  patient appears to continue tracking ketones.  Appears the patient will  not need to be on an insulin pump, but will check ketones to determine  that as the hospital course proceeds.  Continue to look for underlying  stress or etiology to her DKA.  Further evaluation and treatment as  indicated by hospital course.  The patient will be admitted to telemetry  unit and she will be put on a sliding scale with Accu-Cheks checked  every 4  hours and her insulin pump will be discontinued for now and further  evaluation and treatment as indicated by hospital course.   She is being admitted to Team D.      Arne Cleveland, MD  Electronically Signed     ML/MEDQ  D:  10/07/2008  T:  10/08/2008  Job:  [id number]   cc:   Merlene Laughter. Renae Gloss, M.D.

## 2010-07-15 NOTE — Discharge Summary (Signed)
[name], [name]                ACCOUNT NO.:  [id number]   MEDICAL RECORD NO.:  [id number]          PATIENT TYPE:  INP   LOCATION:  5504                         FACILITY:  [facility]   PHYSICIAN:  Sydney Paling, MD    DATE OF BIRTH:  1965-09-18   DATE OF ADMISSION:  10/07/2008  DATE OF DISCHARGE:  10/09/2008                               DISCHARGE SUMMARY   PRIMARY CARE PHYSICIAN:  Sydney Bradford R. Renae Gloss, MD   ADMITTING HISTORY:  Please refer to the excellent admission note  dictated by Dr. Jamey Simmons and the history of present illness.   DISCHARGE DIAGNOSES:  1. Diabetic ketoacidosis, resolved.  2. Acute renal failure with the history of chronic kidney disease,      creatinine 1.6 after IV fluids from 2.58.   SECONDARY DIAGNOSES:  1. History of type 1 diabetes.  2. History of thalassemia.  3. History of baseline chronic kidney disease.  4. History of hypertension.  5. History of hypothyroidism.   DISCHARGE MEDICATIONS:  The patient is to continue her home medication  which are as follows:  1. Humalog insulin pump same dose as she was taking at home.  2. Aspirin 81 mg p.o. daily enteric coated.  3. Wellbutrin 300 mg p.o. daily.  4. Lipitor 40 mg p.o. daily.  5. Claritin 10 mg p.o. daily.  6. Felodipine 5 mg p.o. daily.  7. Lovaza 1 g 2 capsules p.o. daily.  8. Coreg 3.125 mg p.o. daily.   MEDICATION ON HOLD:  Lisinopril.   Medication which is not clear at this time is Metanx.  We would clarify  it with Dr. Renae Simmons who is the primary care physician of the patient.   HOSPITAL COURSE:  Following issues were addressed during the  hospitalization:  1. Mild DKA.  This patient presented with the bicarbonate of 15 and      glucose of 580.  She received insulin drip. Her bicarbonate at      discharge is 22 with IV fluids and insulin drip.  Currently, her      glucose is well managed with insulin pump, which she be will      resuming at home.  2. Acute renal failure.  The  patient received IV fluids.  Her      creatinine at the time of discharge was 1.6.  When she presented,      it was 2.58 most likely it was prerenal in etiology.  Apparently,      she does have a history of baseline chronic renal insufficiency.      She will get her basic metabolic panel checked with Dr. Mathews Simmons      office in 3 days' time.  3. Thalassemia.  Her hemoglobin stayed stable.   CONSULTATION PERFORMED:  None.   PROCEDURE PERFORMED:  None.   IMAGING PERFORMED:  Chest x-ray on October 07, 2008, showing no acute  cardiopulmonary process.   DISPOSITION:  Discharging the patient home.  She will follow up with Dr.  Andi Simmons in 3 days' time with followup on CBC and BMP.   Total time spent  in discharge of this patient 45 minutes.      Sydney Paling, MD  Electronically Signed     NP/MEDQ  D:  10/09/2008  T:  10/09/2008  Job:  [id number]   cc:   [name]. Renae Simmons, M.D.

## 2010-10-01 HISTORY — PX: REFRACTIVE SURGERY: SHX103

## 2010-11-19 ENCOUNTER — Other Ambulatory Visit: Payer: Self-pay | Admitting: Obstetrics & Gynecology

## 2010-11-19 DIAGNOSIS — Z1231 Encounter for screening mammogram for malignant neoplasm of breast: Secondary | ICD-10-CM

## 2010-11-21 ENCOUNTER — Emergency Department (HOSPITAL_COMMUNITY): Payer: BC Managed Care – PPO

## 2010-11-21 ENCOUNTER — Inpatient Hospital Stay (HOSPITAL_COMMUNITY)
Admission: EM | Admit: 2010-11-21 | Discharge: 2010-11-24 | DRG: 143 | Disposition: A | Discharge status: Home / Self Care | Payer: BC Managed Care – PPO | Attending: Internal Medicine | Admitting: Internal Medicine

## 2010-11-21 DIAGNOSIS — Z9641 Presence of insulin pump (external) (internal): Secondary | ICD-10-CM

## 2010-11-21 DIAGNOSIS — N289 Disorder of kidney and ureter, unspecified: Secondary | ICD-10-CM | POA: Diagnosis present

## 2010-11-21 DIAGNOSIS — F411 Generalized anxiety disorder: Secondary | ICD-10-CM | POA: Diagnosis present

## 2010-11-21 DIAGNOSIS — E785 Hyperlipidemia, unspecified: Secondary | ICD-10-CM | POA: Diagnosis present

## 2010-11-21 DIAGNOSIS — F3289 Other specified depressive episodes: Secondary | ICD-10-CM | POA: Diagnosis present

## 2010-11-21 DIAGNOSIS — E0789 Other specified disorders of thyroid: Secondary | ICD-10-CM | POA: Diagnosis present

## 2010-11-21 DIAGNOSIS — Z96659 Presence of unspecified artificial knee joint: Secondary | ICD-10-CM

## 2010-11-21 DIAGNOSIS — I10 Essential (primary) hypertension: Secondary | ICD-10-CM | POA: Diagnosis present

## 2010-11-21 DIAGNOSIS — R0789 Other chest pain: Principal | ICD-10-CM | POA: Diagnosis present

## 2010-11-21 DIAGNOSIS — E860 Dehydration: Secondary | ICD-10-CM | POA: Diagnosis present

## 2010-11-21 DIAGNOSIS — E039 Hypothyroidism, unspecified: Secondary | ICD-10-CM | POA: Diagnosis present

## 2010-11-21 DIAGNOSIS — F329 Major depressive disorder, single episode, unspecified: Secondary | ICD-10-CM | POA: Diagnosis present

## 2010-11-21 DIAGNOSIS — Z794 Long term (current) use of insulin: Secondary | ICD-10-CM

## 2010-11-21 DIAGNOSIS — H409 Unspecified glaucoma: Secondary | ICD-10-CM | POA: Diagnosis present

## 2010-11-21 DIAGNOSIS — F4542 Pain disorder with related psychological factors: Secondary | ICD-10-CM | POA: Diagnosis present

## 2010-11-21 DIAGNOSIS — E11319 Type 2 diabetes mellitus with unspecified diabetic retinopathy without macular edema: Secondary | ICD-10-CM | POA: Diagnosis present

## 2010-11-21 DIAGNOSIS — E1069 Type 1 diabetes mellitus with other specified complication: Secondary | ICD-10-CM | POA: Diagnosis present

## 2010-11-21 DIAGNOSIS — E1039 Type 1 diabetes mellitus with other diabetic ophthalmic complication: Secondary | ICD-10-CM | POA: Diagnosis present

## 2010-11-21 DIAGNOSIS — S88119A Complete traumatic amputation at level between knee and ankle, unspecified lower leg, initial encounter: Secondary | ICD-10-CM

## 2010-11-21 LAB — PROTIME-INR: Prothrombin Time: 13.5 seconds (ref 11.6–15.2)

## 2010-11-21 LAB — COMPREHENSIVE METABOLIC PANEL
ALT: 19 U/L (ref 0–35)
AST: 17 U/L (ref 0–37)
CO2: 24 mEq/L (ref 19–32)
Calcium: 8.6 mg/dL (ref 8.4–10.5)
Sodium: 132 mEq/L — ABNORMAL LOW (ref 135–145)
Total Protein: 7.7 g/dL (ref 6.0–8.3)

## 2010-11-21 LAB — TROPONIN I
Troponin I: <0.3 ng/mL (ref <0.30)
Troponin I: <0.3 ng/mL (ref <0.30)

## 2010-11-21 LAB — CK TOTAL AND CKMB (NOT AT ARMC)
CK, MB: 6.4 ng/mL (ref 0.3–4.0)
CK, MB: 5 ng/mL — ABNORMAL HIGH (ref 0.3–4.0)
Relative Index: 1.8 (ref 0.0–2.5)
Total CK: 358 U/L — ABNORMAL HIGH (ref 7–177)
Total CK: 280 U/L — ABNORMAL HIGH (ref 7–177)

## 2010-11-21 LAB — PRO B NATRIURETIC PEPTIDE: Pro B Natriuretic peptide (BNP): 38.5 pg/mL (ref 0–125)

## 2010-11-21 LAB — D-DIMER, QUANTITATIVE: D-Dimer, Quant: 1.34 ug/mL-FEU — ABNORMAL HIGH (ref 0.00–0.48)

## 2010-11-21 LAB — DIFFERENTIAL
Basophils Relative: 0 % (ref 0–1)
Eosinophils Absolute: 0 10*3/uL (ref 0.0–0.7)
Eosinophils Relative: 0 % (ref 0–5)
Lymphocytes Relative: 41 % (ref 12–46)
Monocytes Relative: 7 % (ref 3–12)
Neutrophils Relative %: 52 % (ref 43–77)

## 2010-11-21 LAB — CBC
Hemoglobin: 10.8 g/dL — ABNORMAL LOW (ref 12.0–15.0)
MCV: 69.2 fL — ABNORMAL LOW (ref 78.0–100.0)
Platelets: 274 10*3/uL (ref 150–400)
RBC: 4.8 MIL/uL (ref 3.87–5.11)
WBC: 4.8 10*3/uL (ref 4.0–10.5)

## 2010-11-21 LAB — URINALYSIS, ROUTINE W REFLEX MICROSCOPIC
Bilirubin Urine: NEGATIVE
Ketones, ur: NEGATIVE mg/dL
Leukocytes, UA: NEGATIVE
Nitrite: NEGATIVE
Protein, ur: 100 mg/dL — AB
Urobilinogen, UA: 0.2 mg/dL (ref 0.0–1.0)

## 2010-11-21 LAB — GLUCOSE, CAPILLARY

## 2010-11-21 MED ORDER — XENON XE 133 GAS
10.5000 | GAS_FOR_INHALATION | Freq: Once | RESPIRATORY_TRACT | Status: AC | PRN
  Administered 2010-11-21: 10.5 via RESPIRATORY_TRACT

## 2010-11-21 MED ORDER — TECHNETIUM TO 99M ALBUMIN AGGREGATED
5.5000 | Freq: Once | INTRAVENOUS | Status: AC | PRN
  Administered 2010-11-21: 5.5 via INTRAVENOUS

## 2010-11-22 LAB — HEMOGLOBIN A1C
Hgb A1c MFr Bld: 10 % — ABNORMAL HIGH (ref <5.7)
Mean Plasma Glucose: 249 mg/dL — ABNORMAL HIGH (ref <117)

## 2010-11-22 LAB — T4, FREE: Free T4: 1.49 ng/dL (ref 0.80–1.80)

## 2010-11-22 LAB — LIPID PANEL: Total CHOL/HDL Ratio: 4.5 RATIO

## 2010-11-22 LAB — BASIC METABOLIC PANEL
CO2: 24 mEq/L (ref 19–32)
Calcium: 9 mg/dL (ref 8.4–10.5)
Chloride: 102 mEq/L (ref 96–112)
Potassium: 3.9 mEq/L (ref 3.5–5.1)
Sodium: 138 mEq/L (ref 135–145)

## 2010-11-22 LAB — TSH: TSH: 1.603 u[IU]/mL (ref 0.350–4.500)

## 2010-11-22 LAB — GLUCOSE, CAPILLARY
Glucose-Capillary: 125 mg/dL — ABNORMAL HIGH (ref 70–99)
Glucose-Capillary: 128 mg/dL — ABNORMAL HIGH (ref 70–99)

## 2010-11-22 LAB — CARDIAC PANEL(CRET KIN+CKTOT+MB+TROPI)
CK, MB: 5 ng/mL — ABNORMAL HIGH (ref 0.3–4.0)
Relative Index: 1.7 (ref 0.0–2.5)
Total CK: 268 U/L — ABNORMAL HIGH (ref 7–177)
Total CK: 265 U/L — ABNORMAL HIGH (ref 7–177)

## 2010-11-22 LAB — VITAMIN B12: Vitamin B-12: >2000 pg/mL — ABNORMAL HIGH (ref 211–911)

## 2010-11-22 LAB — IRON AND TIBC
Saturation Ratios: 16 % — ABNORMAL LOW (ref 20–55)
UIBC: 216 ug/dL (ref 125–400)

## 2010-11-23 LAB — RENAL FUNCTION PANEL
Calcium: 9 mg/dL (ref 8.4–10.5)
Creatinine, Ser: 1.5 mg/dL — ABNORMAL HIGH (ref 0.50–1.10)
GFR calc Af Amer: 45 mL/min — ABNORMAL LOW (ref >60)
Glucose, Bld: 64 mg/dL — ABNORMAL LOW (ref 70–99)
Phosphorus: 4 mg/dL (ref 2.3–4.6)
Sodium: 139 mEq/L (ref 135–145)

## 2010-11-23 LAB — CBC
Hemoglobin: 11.2 g/dL — ABNORMAL LOW (ref 12.0–15.0)
MCH: 22.4 pg — ABNORMAL LOW (ref 26.0–34.0)
MCHC: 32.1 g/dL (ref 30.0–36.0)
MCV: 69.9 fL — ABNORMAL LOW (ref 78.0–100.0)
Platelets: 278 10*3/uL (ref 150–400)

## 2010-11-23 LAB — GLUCOSE, CAPILLARY
Glucose-Capillary: 200 mg/dL — ABNORMAL HIGH (ref 70–99)
Glucose-Capillary: 171 mg/dL — ABNORMAL HIGH (ref 70–99)

## 2010-11-24 LAB — GLUCOSE, CAPILLARY
Glucose-Capillary: 81 mg/dL (ref 70–99)
Glucose-Capillary: 96 mg/dL (ref 70–99)
Glucose-Capillary: 77 mg/dL (ref 70–99)

## 2010-11-25 LAB — HEMOGLOBINOPATHY EVALUATION
Hemoglobin Other: 0 % (ref 0.0–0.0)
Hgb A2 Quant: 2.2 % (ref 2.2–3.2)
Hgb A: 97.8 % (ref 96.8–97.8)

## 2010-11-25 NOTE — H&P (Signed)
NAMEDUNG, PRIEN NO.:  [id number]  MEDICAL RECORD NO.:  [id number]  LOCATION:  WLED                         FACILITY:  [facility]  PHYSICIAN:  Alvino Blood, MD      DATE OF BIRTH:  02-20-1966  DATE OF ADMISSION:  11/21/2010 DATE OF DISCHARGE:                             HISTORY & PHYSICAL   CHIEF COMPLAINTS:  Chest pain.  HISTORY OF PRESENT ILLNESS:  [name] is a 45 year old African American female who has a history of longstanding type 1 diabetes mellitus, on insulin pump; hypertension; hypothyroidism following thyroid surgery for Hashimoto thyroiditis.  She also has a history of primary ovarian failure with early menopause, hyperlipidemia, and glaucoma.  She presents to the emergency room with the above chief complaint stating that at around 8:30 p.m. last night she had a sudden onset of sharp retrosternal chest pains, which radiated to her right chest, right shoulder, right ears, and jaw.  It was severe, 10/10 in intensity, associated with diaphoresis, relieved after she drank some cold drink without any obvious aggravating factors.  She went to bed and after one hour, the pain recurred.  She then took 325 mg of aspirin and her usual blood pressure pill, and the pain completely went away.  Again, she went to bed and woke up this morning, and at around 7:30 a.m., the pain recurred.  At this time, there was dyspnea and general feeling of unwell.  She had slight palpitations, but she denied any nausea, vomiting, or presyncopal episode.  She then came to the emergency room for evaluation.  Here, her EKG only showed borderline R-wave progression in the anterior leads, otherwise no acute abnormalities.  Her initial cardiac enzymes showed an elevated total CK of 358 and CK-MB of 6.4, but the troponin was less than 0.30.  Her chest x-ray was unremarkable and a V/Q scan which was done because of an elevated D- dimer of 1.34 came back as normal.  She was  subsequently given some an analgesia and referred to the medical service for further management.  PAST MEDICAL HISTORY: 1. Type 1 diabetes mellitus for the past 33 years.  She is currently     on an insulin pump. 2. Hypertension for 18 years. 3. Hypothyroidism following thyroidectomy for Hashimoto thyroiditis. 4. Primary ovarian failure with early menopause. 5. Hyperlipidemia. 6. Glaucoma. 7. Hyperlipidemia. 8. Depression.  PAST SURGICAL HISTORY: 1. Thyroidectomy 15 years ago. 2. Right below-knee amputation. 3. Left partial foot amputation. 4. Bilateral cataract surgery. 5. Retinal reattachment surgery in the left eye. 6. Uterine biopsy which showed benign polyp. 7. Colonoscopy in May 2012. 8. Carpal tunnel surgery in the right wrist. 9. Right breast biopsy in 1999, which showed only a benign lesion. 10.Bone resection and amputation of toes secondary to complications of     her diabetes mellitus. 11.Left total knee replacement.  ALLERGIES:  TRAVATAN, this causes itching of the eyes.  MEDICATIONS: 1. Coreg 6.25 mg oarrl daily. 2. Felodipine 10 mg oarlly daily. 3. Lasix 40 mg orally daily. 4. Potassium chloride 20 mEq orally daily. 5. Wellbutrin 300 mg orally daily 6. Lipitor 40mg  0rally daily. 7. Metanx 1 tablet orally twice daily. 8.  Lovaza 1000mg  orally 2 capsule daily. 9. Aspirin 81 mg orally daily. 10.Fexofenadine 180 mg orally daily. 11.Synthroid 125 mcg orally 2 tablets daily 12.Humalog insulin pump. 13. Tylenol extra stregth 500 mg 2 tablets orally every 12 hours.   FAMILY HISTORY:  Positive for her mother who has mitral valve prolapse and there is also family history of heart disease, diabetes mellitus, hypertension, and cancer.  No history of pulmonary embolism or deep vein thrombosis.  SOCIAL HISTORY:  She is single, has no children.  Lives alone.  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  She denies any tobacco or illicit drug use.  She occasionally  drinks alcohol.  Her primary care provider is Dr. Andi Devon.  REVIEW OF SYSTEMS:  She only admits to mild headaches at times and some breaking out of rash in her skin occasionally; otherwise, all systems are reviewed and are negative except as mentioned in the history of present illness.  PHYSICAL EXAMINATION:  VITAL SIGNS:  Temperature 98, pulse 77, respiratory rate 20, blood pressure 147/73, and oxygen saturation 100%. GENERAL EXAMINATIONS:  This is a 45 year old African American female, obese, not in any acute distress. HEENT EXAMINATION:  The head is normocephalic and atraumatic.  There is equal ocular movement bilaterally.  There is presence of a left surgical pupil with diminished peripheral vision, which is old according to the patient.  The external ears and nose appears normal and the oropharyngeal mucosa is dry. NECK:  Supple with evidence of thyroidectomy, but no lymphadenopathy or jugular venous distention. CHEST:  Clear to auscultation without any wheeze or rales.  There is good entry and respiratory effort bilaterally. CARDIOVASCULAR:  Normal first and second heart sounds are heard which are regular in rate and rhythm without any murmurs, rubs, or gallops. ABDOMEN:  Soft, nontender, and nondistended.  Bowel sound is present. There is no palpable hepatosplenomegaly. CNS:  She is alert and oriented x3 without any focal neurological deficits. EXTREMITIES:  The peripheral pulses are palpable.  There is presence of a right-sided lower extremity below-knee prosthesis and evidence of left partial foot amputation.  No evidence of left lower extremity calf swelling or tenderness or tenosynovitis. SKIN:  There are multiple healed scabs in both upper extremities, otherwise no other active rash or lesions.  LABORATORY DATA:  CBC:  White cell count 4.8, hemoglobin 10.8, hematocrit 33.2, platelet count 274, and MCV 69.2 which is low.  D-dimer is elevated at 1.34.  INR is  1.01 and PT 13.5.  Metabolic panel:  Sodium is low at 132, potassium is 4.2, chloride 99, and CO2 of 24.  BUN and creatinine are elevated at 36 and 1.77 respectively.  Glucose is also elevated at 274.  Calcium is 8.6.  Albumin is low at 2.9.  Liver function tests:  Alkaline phosphatase 82, total bilirubin 0.2, AST 17, and ALT 19.  Cardiac enzymes shows an elevated CK of 358, CK-MB 6.4, with a normal troponin of less than 0.30.  Urinalysis shows 250 glucose, trace blood, many squamous epithelium, and few bacteria.  IMAGING STUDIES: 1. EKG which I reviewed myself shows sinus rhythm at a rate of 77     beats per minute with borderline R-wave progression in the anterior     leads, but no other acute ST-T abnormalities. 2. Chest x-ray, this does not show any evidence of acute     cardiopulmonary findings.  There is, however, presence of old     sternotomy changes. 3. V/Q scan, this shows  normal ventilation and perfusion scan without     evidence of acute pulmonary embolism.  ASSESSMENT: 1. Atypical chest pain with slightly elevated cardiac markers     including CK and CK-MB to rule out acute myocardial infarction. 2. Diabetes mellitus type 1, uncontrolled. 3. Acute renal insufficiency with dehydration. 4. Microcytic anemia to rule out gastrointestinal bleed. 5. Hypertension. 6. Hypothyroidism.  PLAN:  The patient has been placed on observation for further management which will include: 1. Telemetry monitoring of her cardiac rhythm, serial cardiac enzymes,     and EKG will be done as well as a fasting lipid in the morning. 2. She will be on aspirin, oxygen supplementation, nitroglycerin, and     morphine as needed for chest pain. 3. She will be hydrated with IV fluids while her input and output and     renal function will be monitored daily. 4. She will be on sliding scale insulin protocol and her usual Humalog     insulin will be continued.  We will also check a HbA1c level. 5. Stool  hemoccult test will be done x3.  We will also check iron,     TIBC, folate, vitamin B12, and hemoglobin electrophoresis levels     also a TSH level will be done. 6. Her usual home medications will be continued except for     Lasix for now due to her renal dysfunction. 7. She will be on Lovenox for DVT prophylaxis.  The patient's condition is guarded and she is a full code and further management will be dependent on her response to the above treatment and test results.          ______________________________ Alvino Blood, MD     SI/MEDQ  D:  11/21/2010  T:  11/21/2010  Job:  [id number]  Electronically Signed by Alvino Blood MD on 11/25/2010 05:48:00 PM

## 2010-11-28 LAB — CULTURE, BLOOD (ROUTINE X 2)
Culture: NO GROWTH
Culture: NO GROWTH

## 2010-12-03 ENCOUNTER — Ambulatory Visit: Payer: BC Managed Care – PPO

## 2010-12-03 NOTE — Discharge Summary (Signed)
[name], [name]                ACCOUNT NO.:  [id number]  MEDICAL RECORD NO.:  [id number]  LOCATION:  1444                         FACILITY:  [facility]  PHYSICIAN:  Kathlen Mody, MD       DATE OF BIRTH:  [birth year]  DATE OF ADMISSION:  11/21/2010 DATE OF DISCHARGE:  11/24/2010                              DISCHARGE SUMMARY   DISCHARGE DIAGNOSES: 1. Chest pain secondary to anxiety. 2. Hypertension. 3. Hyperlipidemia. 4. Depression. 5. History of arrhythmia status post ablation in 2008. 6. Hypoglycemia. 7. Insulin-dependent diabetes mellitus. 8. Anemia. 9. History of diabetic retinopathy. 10.History of depression. 11.Hypoglycemia. 12.Glaucoma.  DISCHARGE MEDICATIONS: 1. Humalog insulin pump with a basal rate of 2.5 units per hour. 2. Coreg 6.25 mg twice a day. 3. Felodipine 10 mg a day. 4. Lasix 40 mg p.o. daily, she has been recommended to hold the Lasix     until her renal function improves and she can restart the     medication after checking her basic metabolic panel in about 1 to 2     weeks. 5. Levothyroxine 250 mcg one tablet daily. 6. Wellbutrin 300 mg 1 tablet daily. 7. Lovaza 2 tablets p.o. daily. 8. Lipitor 40 mg daily. 9. Aspirin 81 mg daily. 10.Fexofenadine 180 mg daily. 11.Extra strength Tylenol 2 capsules twice a day p.r.n.  PERTINENT LABS:  The patient had INR done on admission, which was on a D- dimer, which was elevated at 1.34.  Comprehensive metabolic panel significant for creatinine of 1.77 with sodium of 132, lipase 27, CK-MB showed an elevated CK with an elevated CK-MB of 6.4.  First set of troponins was negative.  Beta pro-BNP was 38.  CBC was significant for a hemoglobin of 10.8, hematocrit of 33.2.  Urinalysis was negative for nitrites and leukocytes.  Next 2 sets of troponin were negative, but the CK-MB trended down to 268 with a CK-MB of 4.5.  Hemoglobin A1c was 10.3. Fasting lipid profile showed LDL of 153.  TSH was within normal  limits. Vitamin B12 was more than 2000.  Folic acid was more than 20.  Free T4 was within normal limits.  Blood cultures were negative so far.  On the day of discharge, her creatinine has decreased to 1.50, and her CBC showed a hemoglobin of 11.2, hematocrit of 34.9.  RADIOLOGY:  The patient had a 2-view chest x-ray, which showed no acute cardiopulmonary disease.  The patient had a VQ scan, which showed normal ventilation perfusion scan.  CONSULTATIONS:  None.  PROCEDURES:  None.  BRIEF HOSPITAL COURSE:  This is a 45 year old lady with a history of insulin-dependent diabetes mellitus on insulin pump, hypertension, hypothyroidism, primary ovarian failure, hyperlipidemia, depression, anxiety.  She came in with substernal chest pain.  She was admitted to telemetry to rule out atrial fibrillation.  She had negative cardiac enzymes.  Her EKG was within normal limits.  Telemetry monitor was within normal limits.  She did report that she had been stressed out lately at home and at work and this could most probably be an anxiety. She has been chest pain-free all through her hospitalization.  Hence atrial fibrillation is ruled out, she has been continued  on her aspirin 81 mg daily.  The patient states she has risk factors for cardiac abnormalities with history of diabetes, hypertension, and hyperlipidemia.  She was recommended to get an outpatient stress test to be referred by her primary care physician on discharge.  Multiple episodes of hypoglycemia.  The patient is an insulin-dependent diabetic, on insulin.  She is at the basal rate of 2.5 units per hour. The patient had episodes of hypoglycemia ranging from 40 to 60 on 3 occasions.  Prior to hypoglycemia, the patient did report that there has been no p.o. intake for almost 12 hours, before she was hypoglycemic. She was recommended to take frequent p.o. intake including snacks to keep her sugars around 100.  She also has an  endocrinologist followup appointment on December 06, 2010.  On the day of discharge, there has been no episodes of hypoglycemia for 24 hours.  She was recommended not to give herself pre-meal boluses unless her sugar was more than 150.  Renal insufficiency.  Would assume mostly it is chronic kidney disease in view of  her longstanding diabetes and hypertension.  Her renal function did improve from 1.7 to 1.5 on discharge.  She is recommended to get a basic metabolic panel in 1 to 2 weeks of time at her PCP's/endocrinologist's office.  At that time she can resume her Lasix.  Hypothyroidism.  She is status post thyroidectomy for Hashimoto thyroiditis.  Continue with her home does of Synthroid as her TSH and free T4 are within normal limits.  Hyperlipidemia.  Her fasting LDL has been more than 150.  She did say that she missed some of her cholesterol medication over the last few months and medication compliance education was given.  Hypertension.  Controlled.  Depression.  No suicidal ideations.  On the day of discharge, the patient's vitals were within normal limits. Her CBC was 101.  Exam was within normal limits.  She has been chest pain-free.  FOLLOWUP: 1. Endocrinologist, on December 06, 2010. 2. PCP as needed. 3. An outpatient stress test to be referred by her PCP.          ______________________________ Kathlen Mody, MD     VA/MEDQ  D:  11/25/2010  T:  11/25/2010  Job:  [id number]  Electronically Signed by Kathlen Mody MD on 12/03/2010 10:27:59 PM

## 2010-12-18 ENCOUNTER — Inpatient Hospital Stay: Admit: 2010-12-18 | Discharge: 2010-12-18 | Disposition: A | Discharge status: Home / Self Care | Payer: Self-pay

## 2010-12-18 ENCOUNTER — Ambulatory Visit
Admission: RE | Admit: 2010-12-18 | Discharge: 2010-12-18 | Disposition: A | Discharge status: Home / Self Care | Payer: BC Managed Care – PPO | Source: Ambulatory Visit | Attending: Obstetrics & Gynecology | Admitting: Obstetrics & Gynecology

## 2010-12-18 DIAGNOSIS — Z1231 Encounter for screening mammogram for malignant neoplasm of breast: Secondary | ICD-10-CM

## 2011-04-12 ENCOUNTER — Encounter (HOSPITAL_COMMUNITY): Payer: Self-pay | Admitting: Adult Health

## 2011-04-12 ENCOUNTER — Other Ambulatory Visit: Payer: Self-pay

## 2011-04-12 ENCOUNTER — Emergency Department (HOSPITAL_COMMUNITY)
Admission: EM | Admit: 2011-04-12 | Discharge: 2011-04-12 | Disposition: A | Discharge status: Home / Self Care | Payer: BC Managed Care – PPO | Attending: Emergency Medicine | Admitting: Emergency Medicine

## 2011-04-12 DIAGNOSIS — H53149 Visual discomfort, unspecified: Secondary | ICD-10-CM | POA: Insufficient documentation

## 2011-04-12 DIAGNOSIS — E039 Hypothyroidism, unspecified: Secondary | ICD-10-CM | POA: Insufficient documentation

## 2011-04-12 DIAGNOSIS — Z7982 Long term (current) use of aspirin: Secondary | ICD-10-CM | POA: Insufficient documentation

## 2011-04-12 DIAGNOSIS — R51 Headache: Secondary | ICD-10-CM | POA: Insufficient documentation

## 2011-04-12 DIAGNOSIS — M25519 Pain in unspecified shoulder: Secondary | ICD-10-CM | POA: Insufficient documentation

## 2011-04-12 DIAGNOSIS — I10 Essential (primary) hypertension: Secondary | ICD-10-CM | POA: Insufficient documentation

## 2011-04-12 DIAGNOSIS — Z79899 Other long term (current) drug therapy: Secondary | ICD-10-CM | POA: Insufficient documentation

## 2011-04-12 DIAGNOSIS — R229 Localized swelling, mass and lump, unspecified: Secondary | ICD-10-CM | POA: Insufficient documentation

## 2011-04-12 DIAGNOSIS — F341 Dysthymic disorder: Secondary | ICD-10-CM | POA: Insufficient documentation

## 2011-04-12 DIAGNOSIS — L819 Disorder of pigmentation, unspecified: Secondary | ICD-10-CM | POA: Insufficient documentation

## 2011-04-12 DIAGNOSIS — Z794 Long term (current) use of insulin: Secondary | ICD-10-CM | POA: Insufficient documentation

## 2011-04-12 DIAGNOSIS — S88119A Complete traumatic amputation at level between knee and ankle, unspecified lower leg, initial encounter: Secondary | ICD-10-CM | POA: Insufficient documentation

## 2011-04-12 DIAGNOSIS — R209 Unspecified disturbances of skin sensation: Secondary | ICD-10-CM | POA: Insufficient documentation

## 2011-04-12 DIAGNOSIS — E119 Type 2 diabetes mellitus without complications: Secondary | ICD-10-CM | POA: Insufficient documentation

## 2011-04-12 HISTORY — DX: Essential (primary) hypertension: I10

## 2011-04-12 LAB — GLUCOSE, CAPILLARY: Glucose-Capillary: 142 mg/dL — ABNORMAL HIGH (ref 70–99)

## 2011-04-12 MED ORDER — KETOROLAC TROMETHAMINE 30 MG/ML IJ SOLN
30.0000 mg | Freq: Once | INTRAMUSCULAR | Status: AC
  Administered 2011-04-12: 30 mg via INTRAVENOUS
  Filled 2011-04-12: qty 1

## 2011-04-12 MED ORDER — HYDROCODONE-ACETAMINOPHEN 5-325 MG PO TABS
1.0000 | ORAL_TABLET | Freq: Once | ORAL | Status: AC
  Administered 2011-04-12: 1 via ORAL
  Filled 2011-04-12: qty 1

## 2011-04-12 NOTE — ED Notes (Signed)
PA at bedside.

## 2011-04-12 NOTE — ED Provider Notes (Signed)
History     CSN: [id number]  Arrival date & time 04/12/11  1657   First MD Initiated Contact with Patient 04/12/11 2007      Chief Complaint  Patient presents with  . Numbness  . Shoulder Pain  . Headache     HPI  History provided by the patient. Patient is a 46 year old female with history of diabetes, hypertension and hypothyroidism presents with complaints of a headache and right facial and upper extremity numbness and tingling. Patient reports symptoms began with a headache around 1-2 PM. Patient tried to lie down in bed and rest and to the short nap. After she woke up she continued to have headache pains described as a tight bandlike pressure wrapped around her head. Headache symptoms were then associated with some right-sided numbness and tingling that spread into the right shoulder and upper arm. Numbness and tingling lasted for approximately one hour but has since resolved. Patient continues to have headache symptoms though these are also improved slightly. She denies any associated photophobia, fever, chills, nausea or vomiting. Patient does report having similar symptoms previously around October or November. She was evaluated by PCP at that time. Patient also reports a past history of headaches with MRIs of her brain in the past. Patient does also report having significant increase in stress recently. She also reports hearing unfortunate news of a close friends health condition to cause her to be sad most of the day. Patient does take Lexapro for her anxiety and depression. She has not missed any doses. Patient denies any SI/HI. Patient also reports having an upcoming physical with her PCP on Wednesday.    Past Medical History  Diagnosis Date  . Diabetes mellitus   . Hypertension   . Thyroid disease     Past Surgical History  Procedure Date  . Below knee leg amputation   . Thyroidectomy   . Retinal detachment repair w/ scleral buckle le   . Eye surgery   . Carpul tunnel      History reviewed. No pertinent family history.  History  Substance Use Topics  . Smoking status: Never Smoker   . Smokeless tobacco: Not on file  . Alcohol Use: No    OB History    Grav Para Term Preterm Abortions TAB SAB Ect Mult Living                  Review of Systems  Constitutional: Negative for fever, chills and appetite change.  HENT: Negative for sore throat and neck pain.   Eyes: Positive for photophobia. Negative for visual disturbance.  Respiratory: Negative for cough.   Gastrointestinal: Negative for nausea, vomiting, abdominal pain, diarrhea and constipation.  Neurological: Positive for numbness and headaches. Negative for facial asymmetry, speech difficulty and weakness.  All other systems reviewed and are negative.    Allergies  Travatan  Home Medications   Current Outpatient Rx  Name Route Sig Dispense Refill  . ASPIRIN EC 81 MG PO TBEC Oral Take 81 mg by mouth daily.    . ATORVASTATIN CALCIUM 40 MG PO TABS Oral Take 40 mg by mouth daily.    Marland Kitchen CARVEDILOL 6.25 MG PO TABS Oral Take 6.25 mg by mouth 2 (two) times daily with a meal.    . ESCITALOPRAM OXALATE 10 MG PO TABS Oral Take 15 mg by mouth daily.    Marland Kitchen FELODIPINE ER 5 MG PO TB24 Oral Take 5 mg by mouth daily.    Marland Kitchen FEXOFENADINE HCL 30 MG  PO TABS Oral Take 30 mg by mouth daily.    . INSULIN LISPRO (HUMAN) 100 UNIT/ML [place] SOLN Subcutaneous Inject 2.5-10 Units into the skin See admin instructions. Takes 2.5 units every hour depending on how much carbohydrates she is eating    . LEVOTHYROXINE SODIUM 25 MCG PO TABS Oral Take 25 mcg by mouth daily.    . ADULT MULTIVITAMIN W/MINERALS CH Oral Take 1 tablet by mouth daily.    . OMEGA-3-ACID ETHYL ESTERS 1 G PO CAPS Oral Take 2 g by mouth daily.      BP 156/72  Pulse 87  Temp(Src) 98.8 F (37.1 C) (Oral)  Resp 18  SpO2 98%  Physical Exam  Nursing note and vitals reviewed. Constitutional: She is oriented to person, place, and time. She appears  well-developed and well-nourished. No distress.  HENT:  Head: Normocephalic and atraumatic.  Right Ear: External ear normal.  Left Ear: External ear normal.  Mouth/Throat: Oropharynx is clear and moist.  Eyes: Conjunctivae and EOM are normal. Pupils are equal, round, and reactive to light.  Neck: Normal range of motion. Neck supple.       No midline cervical tenderness  Cardiovascular: Normal rate and regular rhythm.   Pulmonary/Chest: Effort normal and breath sounds normal. No respiratory distress.  Abdominal: Soft. She exhibits no distension. There is no tenderness.  Musculoskeletal: She exhibits no edema and no tenderness.       Full range of motion right shoulder the patient reports some pain with extreme abduction and external rotation.  No deformities or swelling.Right BKA. Partial amputation of left foot and toes.  Neurological: She is alert and oriented to person, place, and time. She has normal strength. No cranial nerve deficit or sensory deficit.  Skin: Skin is warm and dry.       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 Hyperpigmented macular lesions on bilateral upper extremities with some dryness to skin.  Psychiatric: Her behavior is normal.       Patient is sad with some tearing and crying.    ED Course  Procedures     1. Headache       MDM  8:10 PM patient seen and evaluated. Patient in no acute distress.  Patient discussed with attending physician. Past medical records reviewed. Patient had normal MRIs of the head and neck 2010. CBG is 140 today. She denies any changes in vision or speech and denies any confusion. She denies any weakness in extremities.    Patient reports feeling much better after pain medications. She continues to have no symptoms of numbness. There were no other focal neuro symptoms and her neurologic exam was normal. This time patient is felt stable to return home. She has close followup appointment planned.      Date: 04/13/2011  Rate: 80  Rhythm: normal sinus rhythm  QRS Axis: normal  Intervals: normal  ST/T Wave abnormalities: normal  Conduction Disutrbances:none  Narrative Interpretation:   Old EKG Reviewed: unchanged from 11/21/2010     Angus Seller, PA 04/13/11 [phone]

## 2011-04-12 NOTE — ED Notes (Signed)
Received bed news this am, developed a tension headache that felt like a band around head that radiates to neck and shoulder. At 1 pm right sided shoulder pain and right sided numbness in arm and face began. Alert and oriented, no drift, no droop, equal grips bilaterally.

## 2011-04-13 NOTE — ED Provider Notes (Signed)
Medical screening examination/treatment/procedure(s) were performed by non-physician practitioner and as supervising physician I was immediately available for consultation/collaboration.    Kathryn Cosby L Emiliano Welshans, MD 04/13/11 2243 

## 2011-05-04 ENCOUNTER — Other Ambulatory Visit: Payer: Self-pay | Admitting: Family Medicine

## 2011-05-04 ENCOUNTER — Ambulatory Visit
Admission: RE | Admit: 2011-05-04 | Discharge: 2011-05-04 | Disposition: A | Discharge status: Home / Self Care | Payer: BC Managed Care – PPO | Source: Ambulatory Visit | Attending: Family Medicine | Admitting: Family Medicine

## 2011-05-04 DIAGNOSIS — R52 Pain, unspecified: Secondary | ICD-10-CM

## 2011-05-25 ENCOUNTER — Encounter (HOSPITAL_BASED_OUTPATIENT_CLINIC_OR_DEPARTMENT_OTHER): Payer: BC Managed Care – PPO | Attending: Internal Medicine

## 2011-05-25 DIAGNOSIS — S88119A Complete traumatic amputation at level between knee and ankle, unspecified lower leg, initial encounter: Secondary | ICD-10-CM | POA: Insufficient documentation

## 2011-05-25 DIAGNOSIS — Z79899 Other long term (current) drug therapy: Secondary | ICD-10-CM | POA: Insufficient documentation

## 2011-05-25 DIAGNOSIS — L97809 Non-pressure chronic ulcer of other part of unspecified lower leg with unspecified severity: Secondary | ICD-10-CM | POA: Insufficient documentation

## 2011-05-25 DIAGNOSIS — S98919A Complete traumatic amputation of unspecified foot, level unspecified, initial encounter: Secondary | ICD-10-CM | POA: Insufficient documentation

## 2011-05-25 DIAGNOSIS — Z7982 Long term (current) use of aspirin: Secondary | ICD-10-CM | POA: Insufficient documentation

## 2011-05-25 DIAGNOSIS — E1029 Type 1 diabetes mellitus with other diabetic kidney complication: Secondary | ICD-10-CM | POA: Insufficient documentation

## 2011-05-25 DIAGNOSIS — I129 Hypertensive chronic kidney disease with stage 1 through stage 4 chronic kidney disease, or unspecified chronic kidney disease: Secondary | ICD-10-CM | POA: Insufficient documentation

## 2011-05-25 DIAGNOSIS — E785 Hyperlipidemia, unspecified: Secondary | ICD-10-CM | POA: Insufficient documentation

## 2011-05-25 DIAGNOSIS — E1069 Type 1 diabetes mellitus with other specified complication: Secondary | ICD-10-CM | POA: Insufficient documentation

## 2011-05-25 DIAGNOSIS — N189 Chronic kidney disease, unspecified: Secondary | ICD-10-CM | POA: Insufficient documentation

## 2011-05-26 NOTE — Progress Notes (Signed)
[facility]  NAME:  [name], BRADWAY                ACCOUNT NO.:  [id number]  MEDICAL RECORD NO.:  [id number]      DATE OF BIRTH:  June 28, 1965  PHYSICIAN:  Jonelle Sports. Daliyah Sramek, M.D.  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  The patient has had a complex medical history centered around type 1 diabetes present for number of years, which apparently is in chronically poor control with recent A1c said to have been in excess of 11.  She obviously has had problems in the past which led to a right BK amputation and also has at times, had some trouble with ulcerations on her left lower extremity.  She is an intelligent individual, but with a great deal of denial, works per Dr. Nedra Hai, lives alone, and even though she had developed an ulcer on the stump, has persisted, suffer lots and working certain element of necessity.  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  She noted that her present ulceration appeared approximately 3 weeks ago shortly after she fell at a [employer], wrenched that knee, and developed significant edema.  Apparently, because of this edema and continue to wear her prosthesis, she generated this sizable ulcer on the posterior medial aspect of her high calf.  She has managed this herself simply with simple dressings and has continued to work and so forth. She has had some low-grade fever, apparently in association with what appears to be a hemarthrosis in that joint, but never has there been a significant drainage or odor from the wound itself.  She is here now for our evaluation and advice.  PAST MEDICAL HISTORY:  The primary care doctor for the patient is  Dr. Andi Devon who treats her for her diabetes which is complicated also by chronic renal insufficiency, a left transmetatarsal amputation, and a right BKA.  She has also had left retinal detachment, for which she has had surgery.  She has had a left total hip surgery, right breast lumpectomy for benign disease, and thyroidectomy for a huge chronic thyroiditis, goiter which required a retrosternal exploration as well. She has also had trouble with depression, glaucoma, hyperlipidemia, hypertension, and has known thalassemia minor.  Detailed further medical history is not obtained as the patient says all of her problems are essentially stable.  ALLERGIES:  She is said to be allergic to Travatan eye drops.  MEDICATIONS:  Her regular medications include: 1. Allegra 180 mg daily. 2. Benicar 40 mg daily. 3. Coreg 6.25 mg b.i.d. 4. Citalopram 10 mg daily. 5. Felodipine 5 mg daily. 6. Humalog insulin by pump. 7. Levothyroxine 125 mcg daily. 8. Lexapro 20 mg daily. 9. Lipitor 40 mg daily. 10.Lovaza 1 g twice daily. 11.Nasonex p.r.n. 12.Protonix 40 mg daily. 13.Baby aspirin once daily.  PHYSICAL EXAMINATION:  VITAL SIGNS:  Her blood pressure is 134/80, pulse is 79 and regular, respirations 16, her temperature is 99.2.  Her capillary blood glucose is 196 mg/dL.  She is 6 feet 4 inches tall, weighs 323 pounds. GENERAL:  Not undertaken. EXTREMITIES:  Her left lower extremity is in a special shoe and boot in association with her Charcot deformity.  There was transmetatarsal amputation which she says this is free of lesions and it is not examined.  Her right lower extremity ends in the BK amputation and as noted before, there is considerable edema and slight warmth in the stump itself and particularly as pertains to the knee joint of the prepatellar bursa.  Quite likely, she has had some hemorrhagic stress to this joint in her recent fall and this is in turn the source of her  fever.  On the posterior medial aspect of the very high proximal calf on this stump, there is an open ulceration, measuring 2.5 x 8.0 x 0.2 cm with a granular base.  No drainage.  No evidence of obvious infection.  IMPRESSION:  Traumatically induced diabetic leg ulcer, right below-knee amputation stump, Wagner 2.  DISPOSITION:  The wound requires no debridement.  It is treated today with an application of silver collagen, Hydrogel, covered with a teal dressing, and the patient will be allowed of necessity to return to her prosthesis at this point.  We made an application for EpiFix because it seems likely that this wound with that size will be quite difficult to heal without the assistance of some biological dressing.  Her followup visit will be here in 1 week.          ______________________________ Jonelle Sports. Cheryll Cockayne, M.D.     RES/MEDQ  D:  05/25/2011  T:  05/26/2011  Job:  [id number]

## 2011-06-01 ENCOUNTER — Encounter (HOSPITAL_BASED_OUTPATIENT_CLINIC_OR_DEPARTMENT_OTHER): Payer: BC Managed Care – PPO | Attending: Internal Medicine

## 2011-06-01 DIAGNOSIS — Y835 Amputation of limb(s) as the cause of abnormal reaction of the patient, or of later complication, without mention of misadventure at the time of the procedure: Secondary | ICD-10-CM | POA: Insufficient documentation

## 2011-06-01 DIAGNOSIS — L97809 Non-pressure chronic ulcer of other part of unspecified lower leg with unspecified severity: Secondary | ICD-10-CM | POA: Insufficient documentation

## 2011-06-01 DIAGNOSIS — T8789 Other complications of amputation stump: Secondary | ICD-10-CM | POA: Insufficient documentation

## 2011-06-05 IMAGING — CR DG CHEST 1V
2 series · 2 of 2 positions shown · non-contrast
Comparison: October 07, 2008

CLINICAL DATA: Left chest wall pain after fall

CHEST - 1 VIEW

[view not recorded (1 of 2)]
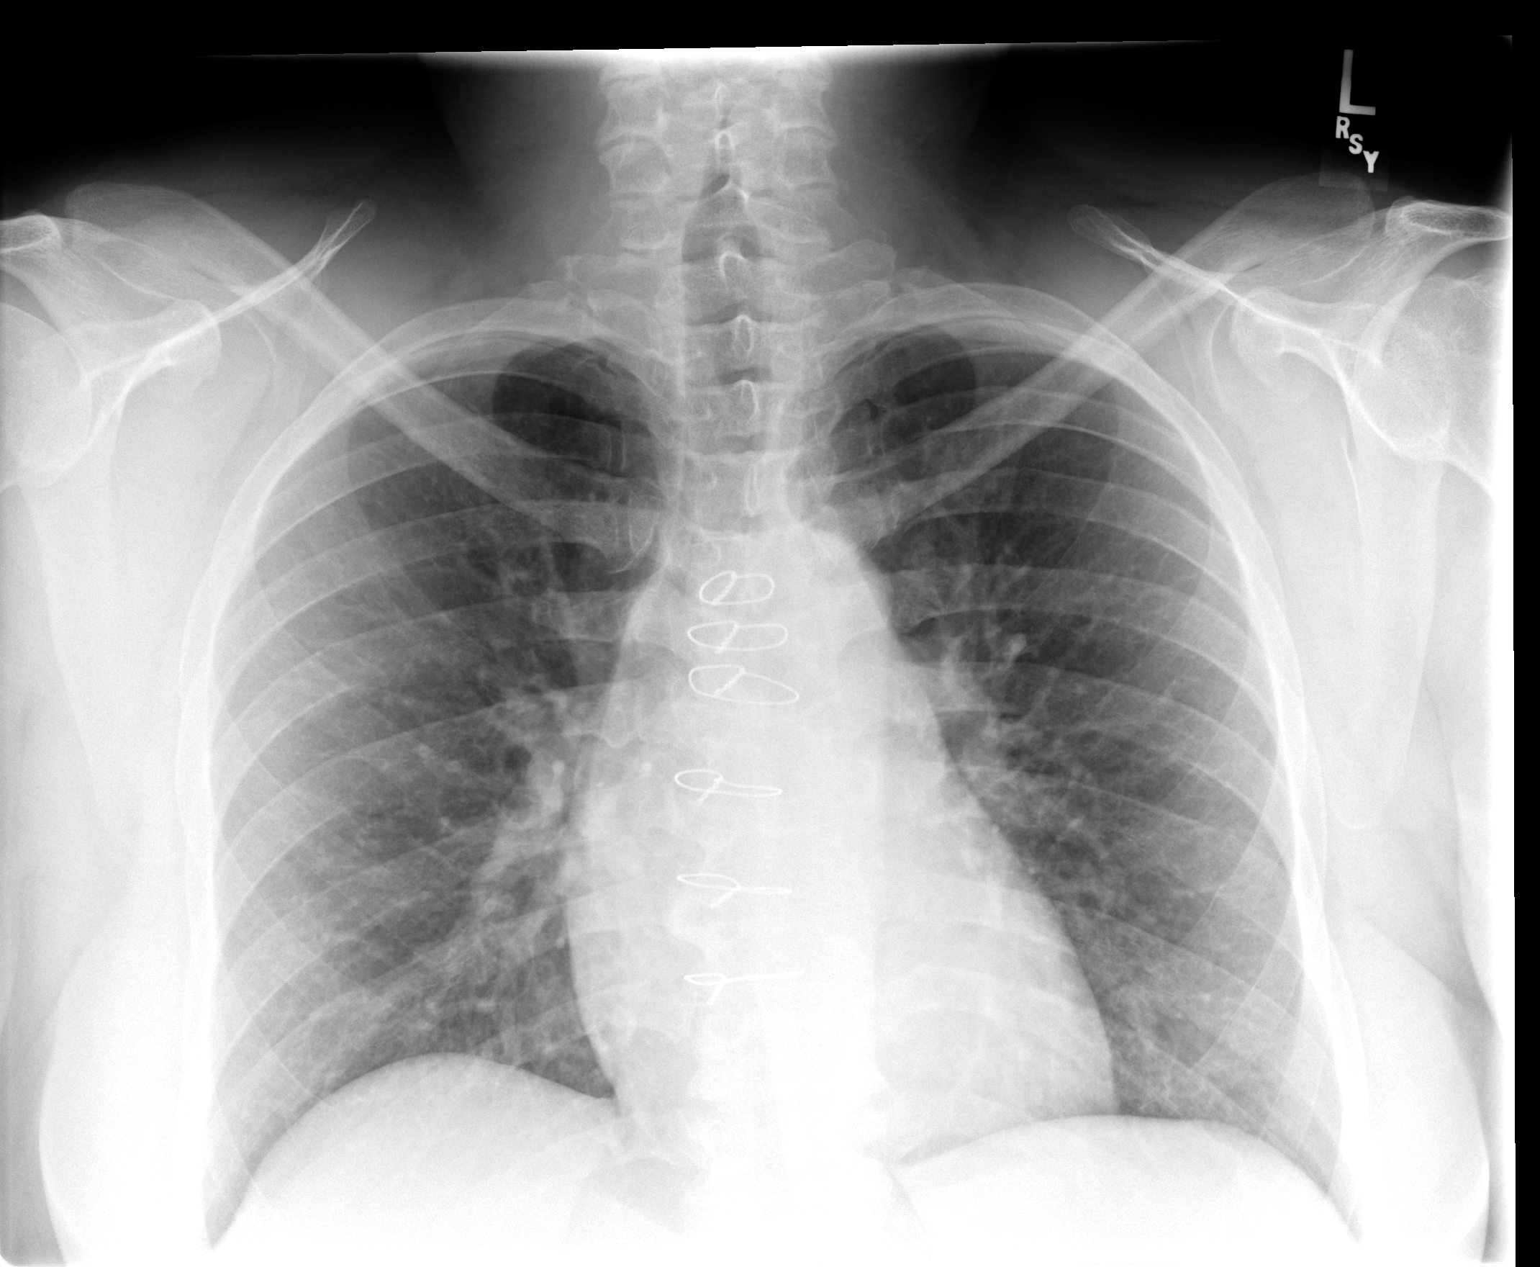

[view not recorded (2 of 2)]
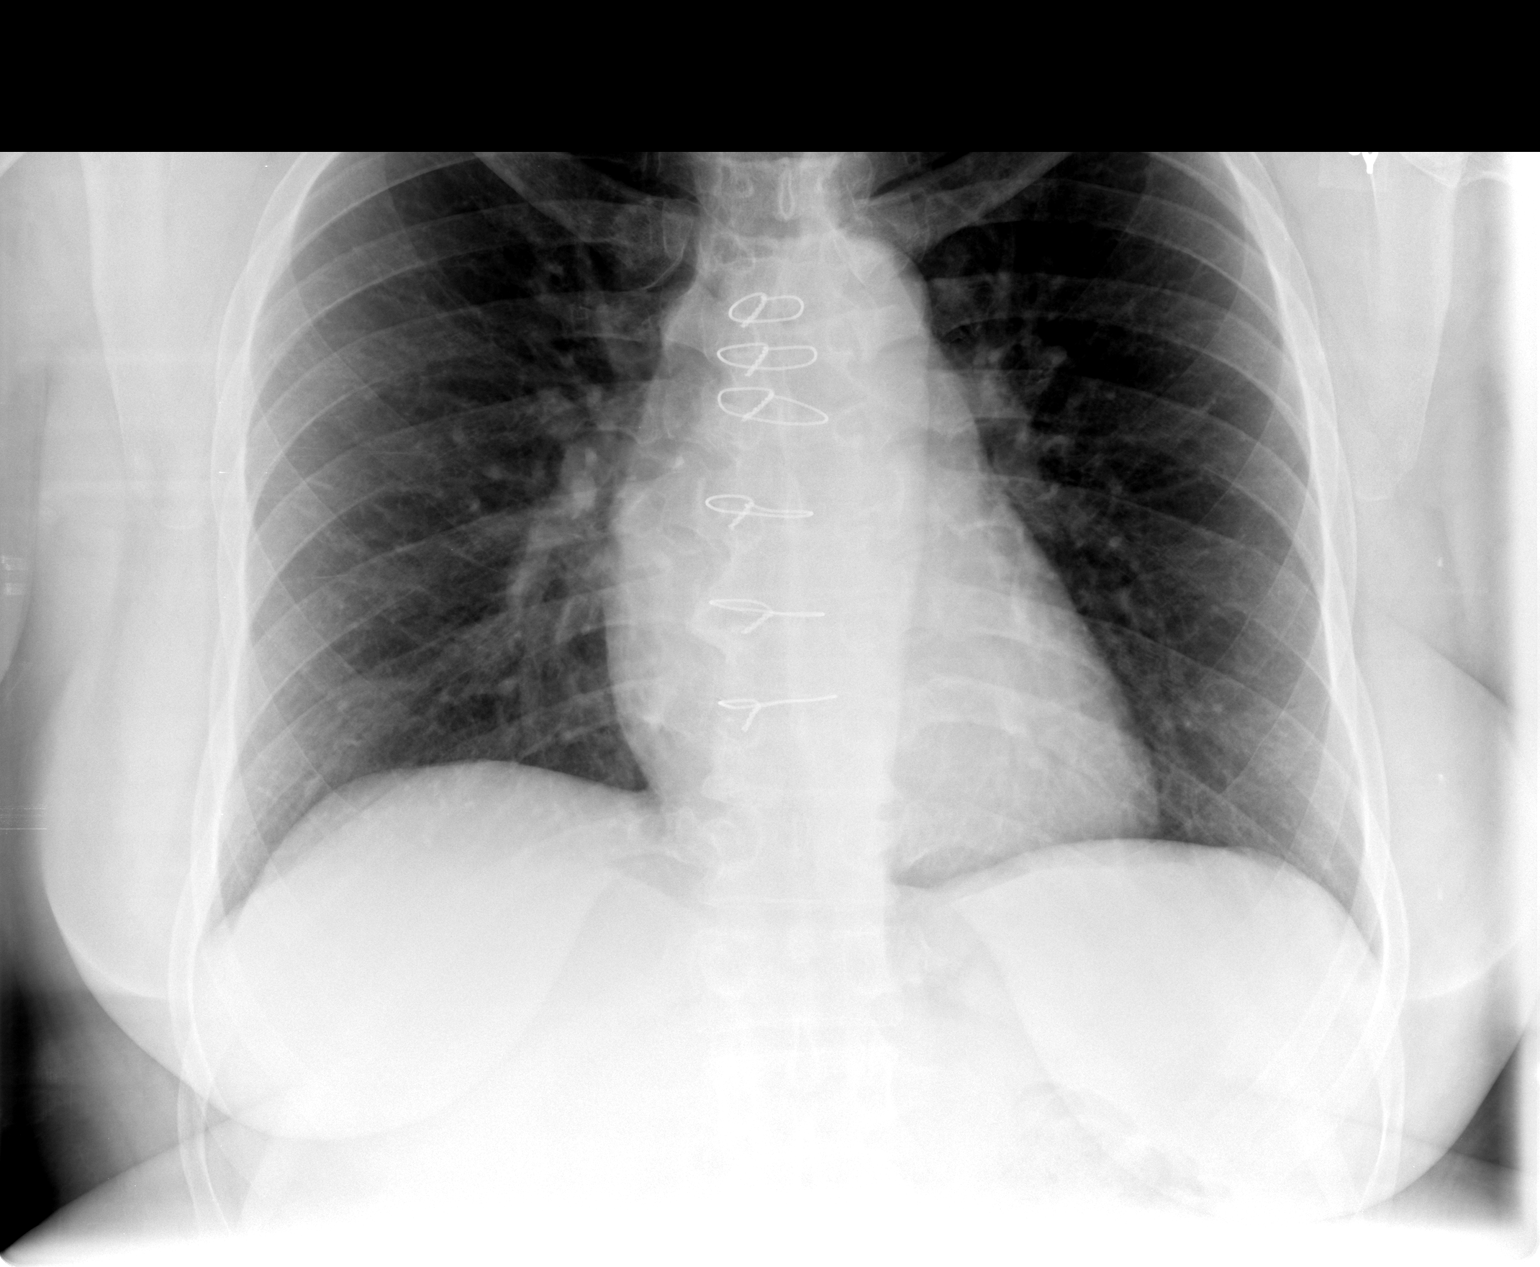

[2 of 2 positions shown; findings below may reference images not displayed]

FINDINGS: The cardiac silhouette, mediastinum, pulmonary
vasculature are within normal limits.  Both lungs are clear.  There
is no evidence of pneumothorax or left rib fracture. There is been
a prior sternotomy.
IMPRESSION: There is no evidence of rib fracture or pneumothorax.

## 2011-06-18 NOTE — Progress Notes (Signed)
[facility]  NAME:  [name], [name]                     ACCOUNT NO.:  MEDICAL RECORD NO.:  [id number]      DATE OF BIRTH:  10/11/1965  PHYSICIAN:  Wayland Denis, DO       VISIT DATE:  06/17/2011                                  OFFICE VISIT   The patient is a 46 year old female who is here for followup on her right lower extremity stump ulcer on the posterior aspect.  She has been using silver collagen.  Overall, the wound is stable.  There has been no great improvement, it is clean, there does not appear to be any infection.  There has been no change in her medications or social history.  EXAM:  GENERAL: She is alert, oriented, cooperative, not in any acute distress.  He is very pleasant. EYES: His pupils are equal.  Extraocular muscles intact.  No cervical lymphadenopathy. ABDOMEN: Her abdomen is large but soft.  The anterior portion of the right lower extremity is completely healed.  The posterior portion is noted above. Recommend continuing with the collagen and keeping off of it and apply for Oasis and possibly the VAC to help heal this up.  She is in agreement with this plan.     Wayland Denis, DO     CS/MEDQ  D:  06/17/2011  T:  06/17/2011  Job:  [id number]

## 2011-06-18 NOTE — H&P (Signed)
[name] is an 46 y.o. female.    Chief Complaint:  Right leg abscess at the site of a below the knee amputation  HPI: Pt is a 46 y.o. female complaining of pain and open sore on the end of her right stump. Patient had a previous right BKA approximately 20 years ago. This had been doing well for her up until about a month ago. The patient states that, on April 16, 2011, she went out to [facility] to get some wings. As she was leaving the [employer]raurant, her prosthetic leg got caught on a mat, causing her to trip and fall, landing on her right knee. Since that time, she's had swelling in the right knee and distal stump. She states, however, that she's continuing to wear her prosthetic leg which has caused her skin to rub on the prosthesis and the skin has started to break down. She complains of 2 wounds on the front of her leg and one at the back with continued swelling of the distal stump.  The patient also has 2 wounds on the anterior aspect of the right stump, both measuring approximately 1 cm. x 3 cm., one medial and lateral. Patient also has a wound on the posterior aspect of the right stump closer to the crease behind the right knee measuring approximately 2-3 cm. by 7-8 cm.She tried some salt water soaks, placed on doxycycline and told not to use her prosthesis to allow the area to rest. Subsequent visits revealed that the anterior wounds were healing well but the posterior was still opened, however not draining. Another follow up visit the the pt stated that she did at time not feel well and an MRI of the right stump was ordered. The MRI revealed abscess of both the anterior medial and lateral aspects of the right stump. Various options are discussed with the patient do to the findings of the MRI and the progress of the wounds not feeling. Risks, benefits and expectations were discussed with the patient. Patient understand the risks, benefits and expectations and wishes to proceed with surgery.    PCP:  Alva Garnet., MD, MD  D/C Plans:  Home with HHPT  Post-op Meds:  No Rx give  Tranexamic Acid:   Not to be given  Decadron:   Not to be given  PMH: Past Medical History  Diagnosis Date  . Diabetes mellitus   . Hypertension   . Thyroid disease         Diabetic retinopathy       Primary ovarian failure       Thalassemia minor       Charcot foot and ankle, left  PSH: Past Surgical History  Procedure Date  . Below knee leg amputation   . Thyroidectomy   . Retinal detachment repair w/ scleral buckle le   . Eye surgery   . Carpul tunnel         Left total hip arthroplasty . Transmetatarsal amputation, left foot   . Right breast lumpectomy, benign   . Bilateral cataract removal       Social History:  reports that she has never smoked. She does not have any smokeless tobacco history on file. She reports that she does have an occasional wine.  Allergies:  Allergies  Allergen Reactions  . Travatan (Travoprost) Itching    Medications:  Current Outpatient Prescriptions  Medication Sig Dispense Refill  . aspirin EC 81 MG tablet Take 81 mg by mouth daily.      Marland Kitchen  atorvastatin (LIPITOR) 40 MG tablet Take 40 mg by mouth daily.      . carvedilol (COREG) 6.25 MG tablet Take 6.25 mg by mouth 2 (two) times daily with a meal.      . escitalopram (LEXAPRO) 10 MG tablet Take 20 mg by mouth daily.      . felodipine (PLENDIL) 5 MG 24 hr tablet Take 5 mg by mouth daily.      . fexofenadine (ALLEGRA) 30 MG tablet Take 30 mg by mouth daily.      . insulin lispro (HUMALOG) 100 UNIT/ML injection Inject 2.5-10 Units into the skin See admin instructions. Takes 2.5 units every hour depending on how much carbohydrates she is eating      . levothyroxine (SYNTHROID, LEVOTHROID) 25 MCG tablet Take 25 mcg by mouth daily.      . Multiple Vitamin (MULITIVITAMIN WITH MINERALS) TABS Take 1 tablet by mouth daily.      Marland Kitchen omega-3 acid ethyl esters (LOVAZA) 1 G capsule. Take 2 g by mouth  daily.            Benicar  40/?          Take 1 tablet by mouth daily.   ROS: Review of Systems  Constitutional: Negative.  Negative for fever and chills.  HENT: Negative.   Eyes: Negative.   Respiratory: Negative.   Cardiovascular: Negative.   Gastrointestinal: Negative.   Genitourinary: Negative.   Musculoskeletal: Positive for joint pain.  Skin: Negative.   Neurological: Negative.   Endo/Heme/Allergies: Positive for environmental allergies.  Psychiatric/Behavioral: Negative.       Physical Exam: Physical Exam  Constitutional: She is well-developed, well-nourished, and in no distress.  HENT:  Head: Normocephalic and atraumatic.  Nose: Nose normal.  Mouth/Throat: Oropharynx is clear and moist.  Eyes: Pupils are equal, round, and reactive to light.  Neck: Neck supple. No JVD present. No tracheal deviation present. No thyromegaly present.  Cardiovascular: Normal rate, regular rhythm and normal heart sounds.   Pulmonary/Chest: Effort normal and breath sounds normal. No respiratory distress. She has no wheezes.  Abdominal: Soft. There is no tenderness. There is no guarding.  Musculoskeletal:       Right knee: She exhibits swelling, effusion and deformity (BKA with open wound on the posterior aspect of the right knee crease.). She exhibits normal range of motion and no ecchymosis. tenderness found.  Lymphadenopathy:    She has no cervical adenopathy.  Skin: Skin is warm and dry.  Psychiatric: Affect normal.     Assessment/Plan Assessment:  Right leg abscess at the site of a below the knee amputation   Plan: Patient will undergo an irrigation and debridement of the right lower leg stump on 06/23/2011. Risks benefits and expectation were discussed with the patient. Patient understand risks, benefits and expectation and wishes to proceed.   Anastasio Auerbach Feras Gardella   PAC  06/18/2011, 3:35 PM

## 2011-06-19 ENCOUNTER — Encounter (HOSPITAL_COMMUNITY): Payer: Self-pay | Admitting: Pharmacy Technician

## 2011-06-22 ENCOUNTER — Encounter (HOSPITAL_BASED_OUTPATIENT_CLINIC_OR_DEPARTMENT_OTHER): Payer: BC Managed Care – PPO

## 2011-06-22 ENCOUNTER — Encounter (HOSPITAL_COMMUNITY): Payer: Self-pay

## 2011-06-22 ENCOUNTER — Encounter (HOSPITAL_COMMUNITY)
Admission: RE | Admit: 2011-06-22 | Discharge: 2011-06-22 | Disposition: A | Discharge status: Home / Self Care | Payer: BC Managed Care – PPO | Source: Ambulatory Visit | Attending: Orthopedic Surgery | Admitting: Orthopedic Surgery

## 2011-06-22 HISTORY — DX: Gastro-esophageal reflux disease without esophagitis: K21.9

## 2011-06-22 HISTORY — DX: Reserved for inherently not codable concepts without codable children: IMO0001

## 2011-06-22 HISTORY — DX: Anxiety disorder, unspecified: F41.9

## 2011-06-22 HISTORY — DX: Headache: R51

## 2011-06-22 HISTORY — DX: Major depressive disorder, single episode, unspecified: F32.9

## 2011-06-22 HISTORY — DX: Encounter for other specified aftercare: Z51.89

## 2011-06-22 HISTORY — DX: Charcot's joint, unspecified ankle and foot: M14.679

## 2011-06-22 HISTORY — DX: Osteomyelitis, unspecified: M86.9

## 2011-06-22 HISTORY — DX: Hypothyroidism, unspecified: E03.9

## 2011-06-22 HISTORY — DX: Unspecified osteoarthritis, unspecified site: M19.90

## 2011-06-22 HISTORY — DX: Depression, unspecified: F32.A

## 2011-06-22 HISTORY — DX: Sleep apnea, unspecified: G47.30

## 2011-06-22 LAB — DIFFERENTIAL
Basophils Relative: 0 % (ref 0–1)
Eosinophils Absolute: 0 10*3/uL (ref 0.0–0.7)
Eosinophils Relative: 0 % (ref 0–5)
Lymphs Abs: 1.7 10*3/uL (ref 0.7–4.0)
Monocytes Absolute: 0.3 10*3/uL (ref 0.1–1.0)
Neutrophils Relative %: 60 % (ref 43–77)

## 2011-06-22 LAB — URINE MICROSCOPIC-ADD ON

## 2011-06-22 LAB — CBC
Hemoglobin: 10 g/dL — ABNORMAL LOW (ref 12.0–15.0)
MCH: 21.4 pg — ABNORMAL LOW (ref 26.0–34.0)
MCHC: 31.3 g/dL (ref 30.0–36.0)
MCV: 68.2 fL — ABNORMAL LOW (ref 78.0–100.0)
Platelets: 365 10*3/uL (ref 150–400)
RBC: 4.68 MIL/uL (ref 3.87–5.11)

## 2011-06-22 LAB — BASIC METABOLIC PANEL
BUN: 34 mg/dL — ABNORMAL HIGH (ref 6–23)
CO2: 24 mEq/L (ref 19–32)
Calcium: 9.4 mg/dL (ref 8.4–10.5)
GFR calc non Af Amer: 31 mL/min — ABNORMAL LOW (ref >90)
Glucose, Bld: 334 mg/dL — ABNORMAL HIGH (ref 70–99)
Sodium: 134 mEq/L — ABNORMAL LOW (ref 135–145)

## 2011-06-22 LAB — URINALYSIS, ROUTINE W REFLEX MICROSCOPIC
Leukocytes, UA: NEGATIVE
Nitrite: NEGATIVE
Specific Gravity, Urine: 1.022 (ref 1.005–1.030)
Urobilinogen, UA: 0.2 mg/dL (ref 0.0–1.0)
pH: 5.5 (ref 5.0–8.0)

## 2011-06-22 LAB — TYPE AND SCREEN: Antibody Screen: NEGATIVE

## 2011-06-22 LAB — SURGICAL PCR SCREEN: Staphylococcus aureus: NEGATIVE

## 2011-06-22 NOTE — Pre-Procedure Instructions (Signed)
PT HAS CXR REPORT FROM [facility] DONE 11/21/2010 AND EKG REPORT [facility] / EPIC FROM 04/12/11--COPIES ALSO ON PT'S CHART.  CBC WITH DIFF, BMET, PT, PTT, UA WERE DONE TODAY PREOP AT [facility].  PT HAS INSULIN PUMP--BUT IT BROKE THIS WEEKEND--SHE HAS BEEN TAKING INSULIN SQ --JUST GOT HER NEW PUMP THIS AM--SHE HAS CALLED DR. ALTHEIMER'S OFFICE TO NOTIFY THEM OF HER SURGERY SCHEDULED FOR TOMORROW AND WILL FIND OUT IF SHE IS TO START NEW PUMP.  PT INSTRUCTED NOT TO TAKE ANY SQ INSULIN IN AM SINCE SHE IS NPO FOR FOOD--AND IF SHE STAYS ON SQ INSULIN TODAY--ADJUST ANY EVENING INSULIN KNOWING SHE IS NPO FOR FOOD IN AM.  OFFICE OF DIABETES CO-ORDINATOR NOTIFIED OF PT'S SURGERY AND POSSIBLE INSULIN PUMP.  PT SIGNED THE PATIENT CONTRACT FOR INSULIN PUMP THERAPY AND INSTRUCTED THAT IF HER DOCTOR DOES WANT HER TO BE ON PUMP POST OP-SHE SHOULD BRING ALL INSULIN PUMP SUPPLIES--AT LEAST 3 DAYS OF SUPPLIES.

## 2011-06-22 NOTE — Pre-Procedure Instructions (Signed)
MATT BABISH PA NOTIFIED BY PHONE CALL OF PT'S ABNORMAL PREOP LABS-GLUCOSE 334, BUN 34, CR 1.88, URINE GLUCOSE > 1000.  ALSO NOTIFIED THAT PT'S INSULIN PUMP HAD BROKEN AND SHE HAS BEEN USING SQ LANTUS AND HUMALOG CORRECTION AND SHE GOT HER NEW PUMP TODAY IN THE MAIL AND SHE HAS CALLED DR. ALTHEIMER'S OFFICE TO NOTIFY HIM OF HER SURGERY TOMORROW AND FIND OUT IF SHE IS TO RESTART HER PUMP OR WHAT HIS INSTRUCTIONS ARE.

## 2011-06-22 NOTE — Pre-Procedure Instructions (Signed)
OR TIME WAS CHANGED FROM 3:30 PM TO 3:05PM TOMORROW--I LEFT A MESSAGE ON PT'S CELL PHONE -AND ASKED THAT SHE ARRIVE TO SHORT STAY BY 12:30 PM AND CLEAR LIQUIDS UNTIL 9:00 AM.

## 2011-06-22 NOTE — Patient Instructions (Addendum)
YOUR SURGERY IS SCHEDULED ON:  Tuesday  4/23  AT 3:30 PM  REPORT TO [facility] AT: 12:30 PM      PHONE # FOR SHORT STAY IS [phone] PT TO CALL DR. ALTHEIMER'S OFFICE FOR INSTRUCTIONS ABOUT HER INSULIN PUMP FOR DAY OF SURGERY. BRING 3 DAYS OF INSULIN PUMP SUPPLIES TO HOSPITAL  DO NOT EAT ANYTHING AFTER MIDNIGHT TONIGHT.   NO FOOD, NO CHEWING GUM, NO MINTS, NO CANDIES, NO CHEWING TOBACCO. YOU MAY HAVE CLEAR LIQUIDS TO DRINK FROM MIDNIGHT TONIGHT UNTIL 9:00 AM DAY OF SURGERY--LIKE __WATER, SODA____NOTHING TO DRINK AFTER 9:00 AM DAY OF YOUR SURGERY.  PLEASE TAKE THE FOLLOWING MEDICATIONS THE AM OF YOUR SURGERY WITH A FEW SIPS OF WATER:  CARVEDILOL (COREG), LEXAPRO, FELODIPINE (PLENDIL), FEXOFENADINE(ALLEGRA), SYNTHROID (LEVOTHYROXINE) AND PROTONIX.  MAY USE NASAL SPRAY IF NEEDED.  BRING NASAL SPRAYS AND EYEDROPS TO HOSPITAL--CHECK WITH NURSES BEFORE USING YOUR SPRAYS OR EYEDROPS.    IF YOU USE INHALERS--USE YOUR INHALERS THE AM OF YOUR SURGERY AND BRING INHALERS TO THE HOSPITAL -TAKE TO SURGERY.    IF YOU ARE DIABETIC:  DO NOT TAKE ANY DIABETIC MEDICATIONS THE AM OF YOUR SURGERY.  IF YOU TAKE INSULIN IN THE EVENINGS--PLEASE ONLY TAKE 1/2 NORMAL EVENING DOSE THE NIGHT BEFORE YOUR SURGERY.  NO INSULIN THE AM OF YOUR SURGERY.  IF YOU HAVE SLEEP APNEA AND USE CPAP OR BIPAP--PLEASE BRING THE MASK --NOT THE MACHINE-NOT THE TUBING   -JUST THE MASK. DO NOT BRING VALUABLES, MONEY, CREDIT CARDS.  CONTACT LENS, DENTURES / PARTIALS, GLASSES SHOULD NOT BE WORN TO SURGERY AND IN MOST CASES-HEARING AIDS WILL NEED TO BE REMOVED.  BRING YOUR GLASSES CASE, ANY EQUIPMENT NEEDED FOR YOUR CONTACT LENS. FOR PATIENTS ADMITTED TO THE HOSPITAL--CHECK OUT TIME THE DAY OF DISCHARGE IS 11:00 AM.  ALL INPATIENT ROOMS ARE PRIVATE - WITH BATHROOM, TELEPHONE, TELEVISION AND WIFI INTERNET. IF YOU ARE BEING DISCHARGED THE SAME DAY OF YOUR SURGERY--YOU CAN NOT DRIVE YOURSELF HOME--AND SHOULD NOT GO HOME ALONE  BY TAXI OR BUS.  NO DRIVING OR OPERATING MACHINERY FOR 24 HOURS FOLLOWING ANESTHESIA / PAIN MEDICATIONS.                            SPECIAL INSTRUCTIONS:  CHLORHEXIDINE SOAP SHOWER (other brand names are Betasept and Hibiclens ) PLEASE SHOWER WITH CHLORHEXIDINE THE NIGHT BEFORE YOUR SURGERY AND THE AM OF YOUR SURGERY. DO NOT USE CHLORHEXIDINE ON YOUR FACE OR PRIVATE AREAS--YOU MAY USE YOUR NORMAL SOAP THOSE AREAS AND YOUR NORMAL SHAMPOO.  WOMEN SHOULD AVOID SHAVING UNDER ARMS AND SHAVING LEGS 48 HOURS BEFORE USING CHLORHEXIDINE TO AVOID SKIN IRRITATION.  DO NOT USE IF ALLERGIC TO CHLORHEXIDINE.  PLEASE READ OVER ANY  FACT SHEETS THAT YOU WERE GIVEN: MRSA INFORMATION, BLOOD TRANSFUSION INFORMATION, INCENTIVE SPIROMETER INFORMATION.

## 2011-06-23 ENCOUNTER — Ambulatory Visit (HOSPITAL_COMMUNITY): Payer: BC Managed Care – PPO | Admitting: *Deleted

## 2011-06-23 ENCOUNTER — Encounter (HOSPITAL_COMMUNITY): Payer: Self-pay | Admitting: *Deleted

## 2011-06-23 ENCOUNTER — Encounter (HOSPITAL_COMMUNITY)
Admission: RE | Disposition: A | Discharge status: Home Health | Payer: Self-pay | Source: Ambulatory Visit | Attending: Orthopedic Surgery

## 2011-06-23 ENCOUNTER — Inpatient Hospital Stay (HOSPITAL_COMMUNITY)
Admission: RE | Admit: 2011-06-23 | Discharge: 2011-06-26 | DRG: 263 | Disposition: A | Discharge status: Home Health | Payer: BC Managed Care – PPO | Source: Ambulatory Visit | Attending: Orthopedic Surgery | Admitting: Orthopedic Surgery

## 2011-06-23 DIAGNOSIS — A5211 Tabes dorsalis: Secondary | ICD-10-CM | POA: Diagnosis present

## 2011-06-23 DIAGNOSIS — E11319 Type 2 diabetes mellitus with unspecified diabetic retinopathy without macular edema: Secondary | ICD-10-CM | POA: Diagnosis present

## 2011-06-23 DIAGNOSIS — E079 Disorder of thyroid, unspecified: Secondary | ICD-10-CM | POA: Diagnosis present

## 2011-06-23 DIAGNOSIS — Z79899 Other long term (current) drug therapy: Secondary | ICD-10-CM

## 2011-06-23 DIAGNOSIS — Z7982 Long term (current) use of aspirin: Secondary | ICD-10-CM

## 2011-06-23 DIAGNOSIS — Z96649 Presence of unspecified artificial hip joint: Secondary | ICD-10-CM

## 2011-06-23 DIAGNOSIS — S88119A Complete traumatic amputation at level between knee and ankle, unspecified lower leg, initial encounter: Secondary | ICD-10-CM

## 2011-06-23 DIAGNOSIS — D563 Thalassemia minor: Secondary | ICD-10-CM | POA: Diagnosis present

## 2011-06-23 DIAGNOSIS — E1139 Type 2 diabetes mellitus with other diabetic ophthalmic complication: Secondary | ICD-10-CM | POA: Diagnosis present

## 2011-06-23 DIAGNOSIS — L02419 Cutaneous abscess of limb, unspecified: Principal | ICD-10-CM

## 2011-06-23 DIAGNOSIS — E1169 Type 2 diabetes mellitus with other specified complication: Secondary | ICD-10-CM | POA: Diagnosis not present

## 2011-06-23 DIAGNOSIS — I10 Essential (primary) hypertension: Secondary | ICD-10-CM | POA: Diagnosis present

## 2011-06-23 HISTORY — PX: IRRIGATION AND DEBRIDEMENT KNEE: SHX5185

## 2011-06-23 LAB — GRAM STAIN: Gram Stain: NONE SEEN

## 2011-06-23 LAB — GLUCOSE, CAPILLARY
Glucose-Capillary: 91 mg/dL (ref 70–99)
Glucose-Capillary: 107 mg/dL — ABNORMAL HIGH (ref 70–99)
Glucose-Capillary: 84 mg/dL (ref 70–99)

## 2011-06-23 SURGERY — IRRIGATION AND DEBRIDEMENT KNEE
Anesthesia: General | Site: Leg Upper | Laterality: Right | Wound class: Contaminated

## 2011-06-23 MED ORDER — LACTATED RINGERS IV SOLN
INTRAVENOUS | Status: DC | PRN
  Administered 2011-06-23: 15:00:00 via INTRAVENOUS

## 2011-06-23 MED ORDER — FLUTICASONE PROPIONATE 50 MCG/ACT NA SUSP: 1.0000 | Freq: Every day | NASAL | Status: DC | PRN

## 2011-06-23 MED ORDER — ZOLPIDEM TARTRATE 5 MG PO TABS: 5.0000 mg | ORAL_TABLET | Freq: Every evening | ORAL | Status: DC | PRN

## 2011-06-23 MED ORDER — FENTANYL CITRATE 0.05 MG/ML IJ SOLN
INTRAMUSCULAR | Status: DC | PRN
  Administered 2011-06-23: 100 ug via INTRAVENOUS

## 2011-06-23 MED ORDER — POLYETHYLENE GLYCOL 3350 17 G PO PACK
17.0000 g | PACK | Freq: Every day | ORAL | Status: DC | PRN
  Filled 2011-06-23: qty 1

## 2011-06-23 MED ORDER — CEFAZOLIN SODIUM-DEXTROSE 2-3 GM-% IV SOLR
INTRAVENOUS | Status: AC
  Filled 2011-06-23: qty 50

## 2011-06-23 MED ORDER — HYDROCODONE-ACETAMINOPHEN 7.5-325 MG PO TABS
1.0000 | ORAL_TABLET | ORAL | Status: DC
  Administered 2011-06-23 (×2): 2 via ORAL
  Administered 2011-06-24 (×4): 1 via ORAL
  Administered 2011-06-24: 2 via ORAL
  Administered 2011-06-25 – 2011-06-26 (×9): 1 via ORAL
  Filled 2011-06-23 (×4): qty 1
  Filled 2011-06-23: qty 2
  Filled 2011-06-23: qty 1
  Filled 2011-06-23: qty 2
  Filled 2011-06-23 (×7): qty 1
  Filled 2011-06-23: qty 2
  Filled 2011-06-23: qty 1

## 2011-06-23 MED ORDER — FLEET ENEMA 7-19 GM/118ML RE ENEM: 1.0000 | ENEMA | Freq: Once | RECTAL | Status: AC | PRN

## 2011-06-23 MED ORDER — KETAMINE HCL 50 MG/ML IJ SOLN
INTRAMUSCULAR | Status: DC | PRN
  Administered 2011-06-23: 30 mg via INTRAMUSCULAR

## 2011-06-23 MED ORDER — SODIUM CHLORIDE 0.9 % IV SOLN
INTRAVENOUS | Status: DC | PRN
  Administered 2011-06-23: 18:00:00 via INTRAVENOUS

## 2011-06-23 MED ORDER — DEXTROSE 50 % IV SOLN
25.0000 mL | Freq: Once | INTRAVENOUS | Status: AC
  Administered 2011-06-23: 25 mL via INTRAVENOUS
  Filled 2011-06-23: qty 50

## 2011-06-23 MED ORDER — DEXTROSE 50 % IV SOLN
INTRAVENOUS | Status: DC | PRN
  Administered 2011-06-23: 12.5 g via INTRAVENOUS

## 2011-06-23 MED ORDER — CEFAZOLIN SODIUM 1-5 GM-% IV SOLN
1.0000 g | Freq: Four times a day (QID) | INTRAVENOUS | Status: DC
  Administered 2011-06-23 – 2011-06-24 (×2): 1 g via INTRAVENOUS
  Filled 2011-06-23 (×6): qty 50

## 2011-06-23 MED ORDER — LABETALOL HCL 5 MG/ML IV SOLN
INTRAVENOUS | Status: DC | PRN
  Administered 2011-06-23: 10 mg via INTRAVENOUS

## 2011-06-23 MED ORDER — CARVEDILOL 6.25 MG PO TABS
6.2500 mg | ORAL_TABLET | Freq: Two times a day (BID) | ORAL | Status: DC
  Administered 2011-06-24 – 2011-06-26 (×5): 6.25 mg via ORAL
  Filled 2011-06-23 (×8): qty 1

## 2011-06-23 MED ORDER — ACETAMINOPHEN 10 MG/ML IV SOLN
INTRAVENOUS | Status: DC | PRN
  Administered 2011-06-23: 1000 mg via INTRAVENOUS

## 2011-06-23 MED ORDER — FERROUS SULFATE 325 (65 FE) MG PO TABS
325.0000 mg | ORAL_TABLET | Freq: Three times a day (TID) | ORAL | Status: DC
  Administered 2011-06-23 – 2011-06-26 (×8): 325 mg via ORAL
  Filled 2011-06-23 (×11): qty 1

## 2011-06-23 MED ORDER — HYDROMORPHONE HCL PF 1 MG/ML IJ SOLN: 0.5000 mg | INTRAMUSCULAR | Status: DC | PRN

## 2011-06-23 MED ORDER — LEVOTHYROXINE SODIUM 125 MCG PO TABS
250.0000 ug | ORAL_TABLET | Freq: Every day | ORAL | Status: DC
  Administered 2011-06-24 – 2011-06-26 (×3): 250 ug via ORAL
  Filled 2011-06-23 (×6): qty 2

## 2011-06-23 MED ORDER — CHLORHEXIDINE GLUCONATE 4 % EX LIQD
60.0000 mL | Freq: Once | CUTANEOUS | Status: DC
  Filled 2011-06-23: qty 60

## 2011-06-23 MED ORDER — PHENOL 1.4 % MT LIQD: 1.0000 | OROMUCOSAL | Status: DC | PRN

## 2011-06-23 MED ORDER — SUCCINYLCHOLINE CHLORIDE 20 MG/ML IJ SOLN
INTRAMUSCULAR | Status: DC | PRN
  Administered 2011-06-23: 100 mg via INTRAVENOUS

## 2011-06-23 MED ORDER — PANTOPRAZOLE SODIUM 20 MG PO TBEC
20.0000 mg | DELAYED_RELEASE_TABLET | Freq: Every day | ORAL | Status: DC | PRN
  Filled 2011-06-23: qty 1

## 2011-06-23 MED ORDER — BUPIVACAINE HCL 0.5 % IJ SOLN
INTRAMUSCULAR | Status: AC
  Filled 2011-06-23: qty 1

## 2011-06-23 MED ORDER — SODIUM CHLORIDE 0.9 % IR SOLN
Status: DC | PRN
  Administered 2011-06-23: 3000 mL

## 2011-06-23 MED ORDER — FENTANYL CITRATE 0.05 MG/ML IJ SOLN: 25.0000 ug | INTRAMUSCULAR | Status: DC | PRN

## 2011-06-23 MED ORDER — METHOCARBAMOL 500 MG PO TABS: 500.0000 mg | ORAL_TABLET | Freq: Four times a day (QID) | ORAL | Status: DC | PRN

## 2011-06-23 MED ORDER — METHOCARBAMOL 100 MG/ML IJ SOLN
500.0000 mg | Freq: Four times a day (QID) | INTRAVENOUS | Status: DC | PRN
  Filled 2011-06-23: qty 5

## 2011-06-23 MED ORDER — PROPOFOL 10 MG/ML IV BOLUS
INTRAVENOUS | Status: DC | PRN
  Administered 2011-06-23: 200 mg via INTRAVENOUS

## 2011-06-23 MED ORDER — DIPHENHYDRAMINE HCL 25 MG PO CAPS: 25.0000 mg | ORAL_CAPSULE | Freq: Four times a day (QID) | ORAL | Status: DC | PRN

## 2011-06-23 MED ORDER — FELODIPINE ER 5 MG PO TB24
5.0000 mg | ORAL_TABLET | Freq: Every day | ORAL | Status: DC
  Administered 2011-06-24 – 2011-06-26 (×3): 5 mg via ORAL
  Filled 2011-06-23 (×4): qty 1

## 2011-06-23 MED ORDER — ONDANSETRON HCL 4 MG PO TABS: 4.0000 mg | ORAL_TABLET | Freq: Four times a day (QID) | ORAL | Status: DC | PRN

## 2011-06-23 MED ORDER — CEFAZOLIN SODIUM-DEXTROSE 2-3 GM-% IV SOLR
2.0000 g | Freq: Once | INTRAVENOUS | Status: AC
  Administered 2011-06-23: 2 g via INTRAVENOUS

## 2011-06-23 MED ORDER — BISACODYL 5 MG PO TBEC: 5.0000 mg | DELAYED_RELEASE_TABLET | Freq: Every day | ORAL | Status: DC | PRN

## 2011-06-23 MED ORDER — LORATADINE 10 MG PO TABS
10.0000 mg | ORAL_TABLET | Freq: Every day | ORAL | Status: DC
  Administered 2011-06-24 – 2011-06-26 (×3): 10 mg via ORAL
  Filled 2011-06-23 (×3): qty 1

## 2011-06-23 MED ORDER — DOCUSATE SODIUM 100 MG PO CAPS
100.0000 mg | ORAL_CAPSULE | Freq: Two times a day (BID) | ORAL | Status: DC
  Administered 2011-06-23 – 2011-06-26 (×6): 100 mg via ORAL
  Filled 2011-06-23 (×7): qty 1

## 2011-06-23 MED ORDER — DEXTROSE 50 % IV SOLN
INTRAVENOUS | Status: AC
  Filled 2011-06-23: qty 50

## 2011-06-23 MED ORDER — IRBESARTAN 300 MG PO TABS
300.0000 mg | ORAL_TABLET | Freq: Every day | ORAL | Status: DC
  Administered 2011-06-23 – 2011-06-26 (×4): 300 mg via ORAL
  Filled 2011-06-23 (×4): qty 1

## 2011-06-23 MED ORDER — INSULIN PUMP: 1.0000 | SUBCUTANEOUS | Status: DC

## 2011-06-23 MED ORDER — LACTATED RINGERS IV SOLN: INTRAVENOUS | Status: DC

## 2011-06-23 MED ORDER — ESCITALOPRAM OXALATE 20 MG PO TABS
20.0000 mg | ORAL_TABLET | Freq: Every day | ORAL | Status: DC
  Administered 2011-06-24 – 2011-06-26 (×3): 20 mg via ORAL
  Filled 2011-06-23 (×4): qty 1

## 2011-06-23 MED ORDER — LIDOCAINE HCL (CARDIAC) 20 MG/ML IV SOLN
INTRAVENOUS | Status: DC | PRN
  Administered 2011-06-23: 100 mg via INTRAVENOUS

## 2011-06-23 MED ORDER — PROMETHAZINE HCL 25 MG/ML IJ SOLN: 6.2500 mg | INTRAMUSCULAR | Status: DC | PRN

## 2011-06-23 MED ORDER — MENTHOL 3 MG MT LOZG: 1.0000 | LOZENGE | OROMUCOSAL | Status: DC | PRN

## 2011-06-23 MED ORDER — SODIUM CHLORIDE 0.9 % IV SOLN
INTRAVENOUS | Status: DC
  Administered 2011-06-23 – 2011-06-25 (×3): via INTRAVENOUS
  Filled 2011-06-23 (×10): qty 1000

## 2011-06-23 MED ORDER — OLOPATADINE HCL 0.1 % OP SOLN: 1.0000 [drp] | Freq: Every day | OPHTHALMIC | Status: DC | PRN

## 2011-06-23 MED ORDER — OLOPATADINE HCL 0.6 % NA SOLN: 2.0000 [drp] | Freq: Every day | NASAL | Status: DC | PRN

## 2011-06-23 MED ORDER — ONDANSETRON HCL 4 MG/2ML IJ SOLN: 4.0000 mg | Freq: Four times a day (QID) | INTRAMUSCULAR | Status: DC | PRN

## 2011-06-23 MED ORDER — OLMESARTAN MEDOXOMIL-HCTZ 40-12.5 MG PO TABS: 1.0000 | ORAL_TABLET | Freq: Every day | ORAL | Status: DC

## 2011-06-23 MED ORDER — HYDROCHLOROTHIAZIDE 12.5 MG PO CAPS
12.5000 mg | ORAL_CAPSULE | Freq: Every day | ORAL | Status: DC
  Administered 2011-06-23 – 2011-06-26 (×4): 12.5 mg via ORAL
  Filled 2011-06-23 (×4): qty 1

## 2011-06-23 MED ORDER — ALUM & MAG HYDROXIDE-SIMETH 200-200-20 MG/5ML PO SUSP: 30.0000 mL | ORAL | Status: DC | PRN

## 2011-06-23 MED ORDER — ACETAMINOPHEN 10 MG/ML IV SOLN
INTRAVENOUS | Status: AC
  Filled 2011-06-23: qty 100

## 2011-06-23 MED ORDER — EPHEDRINE SULFATE 50 MG/ML IJ SOLN
INTRAMUSCULAR | Status: DC | PRN
  Administered 2011-06-23: 10 mg via INTRAVENOUS

## 2011-06-23 MED ORDER — METOCLOPRAMIDE HCL 5 MG/ML IJ SOLN: 5.0000 mg | Freq: Three times a day (TID) | INTRAMUSCULAR | Status: DC | PRN

## 2011-06-23 MED ORDER — INSULIN ASPART 100 UNIT/ML ~~LOC~~ SOLN: 0.0000 [IU] | Freq: Three times a day (TID) | SUBCUTANEOUS | Status: DC

## 2011-06-23 MED ORDER — ATORVASTATIN CALCIUM 40 MG PO TABS
40.0000 mg | ORAL_TABLET | Freq: Every day | ORAL | Status: DC
  Administered 2011-06-23 – 2011-06-25 (×3): 40 mg via ORAL
  Filled 2011-06-23 (×4): qty 1

## 2011-06-23 MED ORDER — METOCLOPRAMIDE HCL 10 MG PO TABS: 5.0000 mg | ORAL_TABLET | Freq: Three times a day (TID) | ORAL | Status: DC | PRN

## 2011-06-23 SURGICAL SUPPLY — 41 items
BAG ZIPLOCK 12X15 (MISCELLANEOUS) ×2 IMPLANT
BANDAGE ELASTIC 6 VELCRO ST LF (GAUZE/BANDAGES/DRESSINGS) ×4 IMPLANT
BANDAGE ESMARK 6X9 LF (GAUZE/BANDAGES/DRESSINGS) ×1 IMPLANT
BANDAGE GAUZE ELAST BULKY 4 IN (GAUZE/BANDAGES/DRESSINGS) ×2 IMPLANT
BNDG ESMARK 6X9 LF (GAUZE/BANDAGES/DRESSINGS) ×2
CLOTH BEACON ORANGE TIMEOUT ST (SAFETY) ×2 IMPLANT
CUFF TOURN SGL QUICK 18 (TOURNIQUET CUFF) IMPLANT
CUFF TOURN SGL QUICK 24 (TOURNIQUET CUFF)
CUFF TOURN SGL QUICK 34 (TOURNIQUET CUFF) ×1
CUFF TRNQT CYL 24X4X40X1 (TOURNIQUET CUFF) IMPLANT
CUFF TRNQT CYL 34X4X40X1 (TOURNIQUET CUFF) ×1 IMPLANT
DRAIN PENROSE 18X1/2 LTX STRL (DRAIN) IMPLANT
DRSG PAD ABDOMINAL 8X10 ST (GAUZE/BANDAGES/DRESSINGS) ×12 IMPLANT
DURAPREP 26ML APPLICATOR (WOUND CARE) IMPLANT
ELECT REM PT RETURN 9FT ADLT (ELECTROSURGICAL) ×2
ELECTRODE REM PT RTRN 9FT ADLT (ELECTROSURGICAL) ×1 IMPLANT
GAUZE XEROFORM 5X9 LF (GAUZE/BANDAGES/DRESSINGS) ×4 IMPLANT
GLOVE BIOGEL PI IND STRL 7.5 (GLOVE) ×1 IMPLANT
GLOVE BIOGEL PI IND STRL 8 (GLOVE) ×1 IMPLANT
GLOVE BIOGEL PI INDICATOR 7.5 (GLOVE) ×1
GLOVE BIOGEL PI INDICATOR 8 (GLOVE) ×1
GLOVE ECLIPSE 8.0 STRL XLNG CF (GLOVE) ×2 IMPLANT
GLOVE ORTHO TXT STRL SZ7.5 (GLOVE) ×4 IMPLANT
GLOVE SURG ORTHO 8.0 STRL STRW (GLOVE) ×4 IMPLANT
GOWN BRE IMP PREV XXLGXLNG (GOWN DISPOSABLE) ×2 IMPLANT
GOWN STRL NON-REIN LRG LVL3 (GOWN DISPOSABLE) ×2 IMPLANT
HANDPIECE INTERPULSE COAX TIP (DISPOSABLE) ×1
IMMOBILIZER KNEE 20 (SOFTGOODS) ×4
IMMOBILIZER KNEE 20 THIGH 36 (SOFTGOODS) ×2 IMPLANT
KIT BASIN OR (CUSTOM PROCEDURE TRAY) IMPLANT
MANIFOLD NEPTUNE II (INSTRUMENTS) ×2 IMPLANT
PACK LOWER EXTREMITY WL (CUSTOM PROCEDURE TRAY) ×2 IMPLANT
PAD CAST 4YDX4 CTTN HI CHSV (CAST SUPPLIES) ×1 IMPLANT
PADDING CAST COTTON 4X4 STRL (CAST SUPPLIES) ×1
POSITIONER SURGICAL ARM (MISCELLANEOUS) ×2 IMPLANT
SET HNDPC FAN SPRY TIP SCT (DISPOSABLE) ×1 IMPLANT
SOL PREP PROV IODINE SCRUB 4OZ (MISCELLANEOUS) ×2 IMPLANT
SPONGE GAUZE 4X4 12PLY (GAUZE/BANDAGES/DRESSINGS) ×6 IMPLANT
SUT ETHILON 2 0 PSLX (SUTURE) ×6 IMPLANT
SYR CONTROL 10ML LL (SYRINGE) IMPLANT
TOWEL OR 17X26 10 PK STRL BLUE (TOWEL DISPOSABLE) ×4 IMPLANT

## 2011-06-23 NOTE — Preoperative (Signed)
Beta Blockers   Reason not to administer Beta Blockers:Not Applicable 

## 2011-06-23 NOTE — Anesthesia Procedure Notes (Signed)
Date/Time: 06/23/2011 4:48 PM Performed by: Leroy Libman L Patient Re-evaluated:Patient Re-evaluated prior to inductionOxygen Delivery Method: Circle system utilized Preoxygenation: Pre-oxygenation with 100% oxygen Intubation Type: IV induction Ventilation: Mask ventilation without difficulty and Oral airway inserted - appropriate to patient size Laryngoscope Size: Hyacinth Meeker and 2 Grade View: Grade II Tube type: Oral Tube size: 7.5 mm Number of attempts: 1 Airway Equipment and Method: Stylet Placement Confirmation: ETT inserted through vocal cords under direct vision,  breath sounds checked- equal and bilateral and positive ETCO2 Secured at: 22 cm Dental Injury: Teeth and Oropharynx as per pre-operative assessment

## 2011-06-23 NOTE — Interval H&P Note (Signed)
History and Physical Interval Note:  06/23/2011 4:32 PM  [name]  has presented today for surgery, with the diagnosis of right knee abscess   The various methods of treatment have been discussed with the patient and family. After consideration of risks, benefits and other options for treatment, the patient has consented to  Procedure(s) (LRB): IRRIGATION AND DEBRIDEMENT RIGHT KNEE (Right) as a surgical intervention .  The patients' history has been reviewed, patient examined, no change in status, stable for surgery.  I have reviewed the patients' chart and labs.  Questions were answered to the patient's satisfaction.     Shelda Pal

## 2011-06-23 NOTE — Transfer of Care (Signed)
Immediate Anesthesia Transfer of Care Note  Patient: [name]  Procedure(s) Performed: Procedure(s) (LRB): IRRIGATION AND DEBRIDEMENT KNEE (Right)  Patient Location: PACU  Anesthesia Type: General  Level of Consciousness: awake and alert   Airway & Oxygen Therapy: Patient Spontanous Breathing and Patient connected to face mask oxygen  Post-op Assessment: Report given to PACU RN and Post -op Vital signs reviewed and stable  Post vital signs: Reviewed and stable  Complications: No apparent anesthesia complications

## 2011-06-23 NOTE — Transfer of Care (Signed)
Immediate Anesthesia Transfer of Care Note  Patient: [name]  Procedure(s) Performed: Procedure(s) (LRB): IRRIGATION AND DEBRIDEMENT KNEE (Right)  Patient Location: PACU  Anesthesia Type: General  Level of Consciousness: awake, alert  and oriented  Airway & Oxygen Therapy: Patient Spontanous Breathing and Patient connected to face mask oxygen  Post-op Assessment: Report given to PACU RN and Post -op Vital signs reviewed and stable  Post vital signs: Reviewed and stable  Complications: No apparent anesthesia complications

## 2011-06-23 NOTE — Progress Notes (Signed)
ANTIBIOTIC CONSULT NOTE - INITIAL  Pharmacy Consult for Ancef Indication: Right knee abscess  Allergies  Allergen Reactions  . Fish Allergy Anaphylaxis and Itching  . Travatan (Travoprost) Itching    Patient Measurements: Height: 6' 3.98" (193 cm) Weight: 317 lb 0.3 oz (143.8 kg) IBW/kg (Calculated) : 82.26   Vital Signs: Temp: 97.8 F (36.6 C) (04/23 1853) Temp src: Oral (04/23 1216) BP: 149/85 mmHg (04/23 1853) Pulse Rate: 74  (04/23 1853) Intake/Output from previous day:   Intake/Output from this shift:    Labs:  Basename 06/22/11 1440  WBC 4.9  HGB 10.0*  PLT 365  LABCREA --  CREATININE 1.88*   Estimated Creatinine Clearance: 61.8 ml/min (by C-G formula based on Cr of 1.88). No results found for this basename: VANCOTROUGH:2,VANCOPEAK:2,VANCORANDOM:2,GENTTROUGH:2,GENTPEAK:2,GENTRANDOM:2,TOBRATROUGH:2,TOBRAPEAK:2,TOBRARND:2,AMIKACINPEAK:2,AMIKACINTROU:2,AMIKACIN:2, in the last 72 hours   Microbiology: Recent Results (from the past 720 hour(s))  SURGICAL PCR SCREEN     Status: Normal   Collection Time   06/22/11  1:42 PM      Component Value Range Status Comment   MRSA, PCR NEGATIVE  NEGATIVE  Final    Staphylococcus aureus NEGATIVE  NEGATIVE  Final   GRAM STAIN     Status: Normal   Collection Time   06/23/11  5:13 PM      Component Value Range Status Comment   Specimen Description ABSCESS RIGHT KNEE   Final    Special Requests NONE   Final    Gram Stain     Final    Value: NO WBC SEEN     NO ORGANISMS SEEN     Gram Stain Report Called to,Read Back By and Verified With: J. [id number]   Report Status 06/23/2011 FINAL   Final     Medical History: Past Medical History  Diagnosis Date  . Hypertension   . Thyroid disease   . Hypothyroidism   . Diabetic retinopathy   . Thalassemia minor   . Charcot ankle     AND FOOT - LEFT  . Dysrhythmia     PAST HX SVT--HAD HEART ABLATION 2002--NO PROBLEMS SINCE  . Osteomyelitis of foot 1993    PT  REQUIRED BELOW THE KNEE AMPUTATION   . Diabetes mellitus     PT HAS INSULIN PUMP-DR. ALTHEIMER MANAGES PT'S PUMP  . Blood transfusion   . Chronic kidney disease     RENAL INSUFFICIENCY  . GERD (gastroesophageal reflux disease)     PT WOULD TAKE PROTONIX IF HAVING GERD  . Headache     2010--DX WITH [employer]ER H/A'S -JUST OCCAS BAD HEADACHE NOW  . 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 Depression   . Anxiety   . Sleep apnea     PT STATES SLEEP STUDY ABOUT 13 YRS AGO-TOLD SHE HAD SLEEP APNEA BUT NEVER GIVEN CPAP    Medications:  Scheduled:    . atorvastatin  40 mg Oral QHS  . carvedilol  6.25 mg Oral BID  .  ceFAZolin (ANCEF) IV  2 g Intravenous Once  . dextrose  25 mL Intravenous Once  . docusate sodium  100 mg Oral BID  . escitalopram  20 mg Oral QPC breakfast  . felodipine  5 mg Oral QPC breakfast  . ferrous sulfate  325 mg Oral TID PC  . HYDROcodone-acetaminophen  1-2 tablet Oral Q4H  . insulin aspart  0-15 Units Subcutaneous TID WC  . insulin pump  1 each Subcutaneous See  admin instructions  . levothyroxine  250 mcg Oral QAC breakfast  . loratadine  10 mg Oral Daily  . olmesartan-hydrochlorothiazide  1 tablet Oral QHS  . DISCONTD: chlorhexidine  60 mL Topical Once   Infusions:    . sodium chloride 0.9 % 1,000 mL with potassium chloride 10 mEq infusion    . DISCONTD: lactated ringers     Assessment:  46 year old female with right BKA s/p I&D of right knee abscess and leg wound   S/P fall 2 months ago and with swelling in knee/stump; pt with prosthetic leg  Goal of Therapy:  Eradication of infection  Plan:  Ancef 1gm IV q6h Follow cultures/sensitivities  Sukhmani Fetherolf, Joselyn Glassman 06/23/2011,7:09 PM

## 2011-06-23 NOTE — Brief Op Note (Signed)
06/23/2011  [facility]  MRN: [id number]   CSN:  [id number]  6:23 PM  PATIENT:  [name]  46 y.o. 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  Chronic wound posterior leg   PROCEDURE:  Procedure(s) (LRB): IRRIGATION AND DEBRIDEMENT right leg, anterior medial and lateral abscess, 2.  Incisional debridement of right posterior leg wound  SURGEON:  Surgeon(s) and Role:    * Shelda Pal, MD - Primary  PHYSICIAN ASSISTANT: Lanney Gins   ANESTHESIA:   general  EBL:  Total I/O In: 1100 [I.V.:1100] Out: 25 [Blood:25]  BLOOD ADMINISTERED:none  DRAINS: (1 medium) Hemovact drain(s) in the right leg with  Suction Open   LOCAL MEDICATIONS USED:  NONE  SPECIMEN:  Source of Specimen:  right leg  DISPOSITION OF SPECIMEN:  PATHOLOGY  COUNTS:  YES  TOURNIQUET:   Total Tourniquet Time Documented: Thigh (Right) - 31 minutes  DICTATION: .Other Dictation: Dictation Number [id number]  PLAN OF CARE: Admit to inpatient   PATIENT DISPOSITION:  PACU - hemodynamically stable.   Delay start of Pharmacological VTE agent (>24hrs) due to surgical blood loss or risk of bleeding: not applicable

## 2011-06-23 NOTE — Anesthesia Postprocedure Evaluation (Signed)
Anesthesia Post Note  Patient: [name]  Procedure(s) Performed: Procedure(s) (LRB): IRRIGATION AND DEBRIDEMENT KNEE (Right)  Anesthesia type: General  Patient location: PACU  Post pain: Pain level controlled  Post assessment: Post-op Vital signs reviewed  Last Vitals:  Filed Vitals:   06/23/11 1800  BP:   Pulse:   Temp: 36.7 C  Resp:     Post vital signs: Reviewed  Level of consciousness: sedated  Complications: No apparent anesthesia complications

## 2011-06-23 NOTE — Anesthesia Preprocedure Evaluation (Signed)
Anesthesia Evaluation  Patient identified by MRN, date of birth, ID band Patient awake    Reviewed: Allergy & Precautions, H&P , NPO status , Patient's Chart, lab work & pertinent test results  History of Anesthesia Complications Negative for: history of anesthetic complications  Airway Mallampati: II TM Distance: >3 FB Neck ROM: Full    Dental  (+) Teeth Intact, Caps and Dental Advisory Given,    Pulmonary neg pulmonary ROS, sleep apnea ,  breath sounds clear to auscultation  Pulmonary exam normal       Cardiovascular hypertension, Pt. on medications negative cardio ROS  + dysrhythmias (Prior ablation procedure in past) Atrial Fibrillation Rhythm:Regular Rate:Normal     Neuro/Psych  Headaches, PSYCHIATRIC DISORDERS Anxiety Depression negative neurological ROS  negative psych ROS   GI/Hepatic negative GI ROS, Neg liver ROS, GERD-  Medicated,  Endo/Other  negative endocrine ROSDiabetes mellitus-, Poorly Controlled, Type 2, Insulin DependentHypothyroidism (Prior total thyroidectomy)   Renal/GU negative Renal ROS  negative genitourinary   Musculoskeletal negative musculoskeletal ROS (+)   Abdominal   Peds negative pediatric ROS (+)  Hematology negative hematology ROS (+)   Anesthesia Other Findings   Reproductive/Obstetrics negative OB ROS                           Anesthesia Physical Anesthesia Plan  ASA: III  Anesthesia Plan: General   Post-op Pain Management:    Induction: Intravenous  Airway Management Planned: Oral ETT  Additional Equipment:   Intra-op Plan:   Post-operative Plan: Extubation in OR  Informed Consent: I have reviewed the patients History and Physical, chart, labs and discussed the procedure including the risks, benefits and alternatives for the proposed anesthesia with the patient or authorized representative who has indicated his/her understanding and  acceptance.   Dental advisory given  Plan Discussed with: CRNA  Anesthesia Plan Comments:         Anesthesia Quick Evaluation

## 2011-06-24 LAB — CBC
HCT: 28.4 % — ABNORMAL LOW (ref 36.0–46.0)
MCH: 21.4 pg — ABNORMAL LOW (ref 26.0–34.0)
MCV: 68.9 fL — ABNORMAL LOW (ref 78.0–100.0)
Platelets: 339 10*3/uL (ref 150–400)
RDW: 13.7 % (ref 11.5–15.5)

## 2011-06-24 LAB — GLUCOSE, CAPILLARY
Glucose-Capillary: 61 mg/dL — ABNORMAL LOW (ref 70–99)
Glucose-Capillary: 67 mg/dL — ABNORMAL LOW (ref 70–99)
Glucose-Capillary: 84 mg/dL (ref 70–99)
Glucose-Capillary: 91 mg/dL (ref 70–99)
Glucose-Capillary: 108 mg/dL — ABNORMAL HIGH (ref 70–99)

## 2011-06-24 LAB — BASIC METABOLIC PANEL
BUN: 25 mg/dL — ABNORMAL HIGH (ref 6–23)
CO2: 26 mEq/L (ref 19–32)
Calcium: 8.7 mg/dL (ref 8.4–10.5)
Creatinine, Ser: 1.67 mg/dL — ABNORMAL HIGH (ref 0.50–1.10)

## 2011-06-24 MED ORDER — CEFAZOLIN SODIUM-DEXTROSE 2-3 GM-% IV SOLR
2.0000 g | Freq: Three times a day (TID) | INTRAVENOUS | Status: DC
  Administered 2011-06-24 – 2011-06-26 (×6): 2 g via INTRAVENOUS
  Filled 2011-06-24 (×9): qty 50

## 2011-06-24 MED ORDER — FERROUS SULFATE 325 (65 FE) MG PO TABS: 325.0000 mg | ORAL_TABLET | Freq: Three times a day (TID) | ORAL | Status: AC

## 2011-06-24 MED ORDER — SODIUM CHLORIDE 0.9 % IJ SOLN
10.0000 mL | INTRAMUSCULAR | Status: DC | PRN
  Administered 2011-06-26: 10 mL

## 2011-06-24 MED ORDER — INSULIN PUMP: 1.0000 | SUBCUTANEOUS | Status: DC

## 2011-06-24 MED ORDER — DIPHENHYDRAMINE HCL 25 MG PO CAPS: 25.0000 mg | ORAL_CAPSULE | Freq: Four times a day (QID) | ORAL | Status: DC | PRN

## 2011-06-24 MED ORDER — POLYETHYLENE GLYCOL 3350 17 G PO PACK: 17.0000 g | PACK | Freq: Every day | ORAL | Status: AC | PRN

## 2011-06-24 MED ORDER — DSS 100 MG PO CAPS: 100.0000 mg | ORAL_CAPSULE | Freq: Two times a day (BID) | ORAL | Status: AC

## 2011-06-24 NOTE — Progress Notes (Signed)
Patient alert and oriented this shift noted with cbg of 61 at 0813 she was given a cup of apple juice and graham crackers. Rechecked at 0843 noted at 64, she was alert with skin warm and dry. Additional cup of apple juice given along with breakfast. She was rechecked at 0915 and noted at 84, no s/s of hypoglycemia noted.

## 2011-06-24 NOTE — Progress Notes (Signed)
Inpatient Diabetes Program Recommendations  AACE/ADA: New Consensus Statement on Inpatient Glycemic Control (2009)  Target Ranges:  Prepandial:   less than 140 mg/dL      Peak postprandial:   less than 180 mg/dL (1-2 hours)      Critically ill patients:  140 - 180 mg/dL   Reason for Visit: Insulin pump and hypoglycemia Results for [name], [name] (MRN [id number]) as of 06/24/2011 18:34  Ref. Range 06/24/2011 07:33 06/24/2011 08:13 06/24/2011 08:44 06/24/2011 09:15 06/24/2011 12:23 06/24/2011 16:19 06/24/2011 16:46  Glucose-Capillary Latest Range: 70-99 mg/dL 52 (L) 61 (L) 67 (L) 84 91 48 (L) 92    Reviewed settings on insulin pump.  All basal rates at 2.25 units/hr.  Her bolus insulin to carb ratio is 1 unit per 5 gms and her sensitivity factor is 1 unit per 28 mg/ldL over target cbg of 110-110 mg/dL.  CBG's have been running low in 40's, 50's, and 50's since she came out of surgery yesterday.  Called Dr. Leslie Dales to request a decrease in basal rates to a temporary reduction of 80%. MD approved and Roma Schanz, PA approved.  I will assist pt with adjustment.    Note: Thank you, Lenor Coffin, RN, CNS, Diabetes Coordinator [phone])

## 2011-06-24 NOTE — Progress Notes (Signed)
CARE MANAGEMENT NOTE 06/24/2011  Patient:  [name], [name]   Account Number:  [id number]  Date Initiated:  06/24/2011  Documentation initiated by:  Colleen Can  Subjective/Objective Assessment:   DX RT KNEE ABCSESS,; incision and drainage of abscess at site of below knee amputation     Action/Plan:   CM spoke with patient. Plans are for patient to return to her home in [facility] where she will have support from friends. Plans to used [facility] for [facility] services because she will be going home on IV abx. Has used [facility] in the past.   Anticipated DC Date:  06/25/2011   Anticipated DC Plan:  HOME W HOME HEALTH SERVICES  In-house referral  NA      DC Planning Services  CM consult      PAC Choice  DURABLE MEDICAL EQUIPMENT  HOME HEALTH   Choice offered to / List presented to:  C-1 Patient   DME arranged  NA      DME agency  NA     HH arranged  HH-1 RN  IV Antibiotics      HH agency  [facility]   Status of service:  In process, will continue to follow Medicare Important Message given?  NO (If response is "NO", the following Medicare IM given date fields will be blank) Per UR Regulation:  Reviewed for med. necessity/level of care/duration of stay  Comments:  06/24/2011 Raynelle Bring BSN CCM [phone] Pt states she already has RW, transfer bench and elevated toilet stool. Genevieve Norlander will arrange for wheelchair with fly away legs. Genevieve Norlander will provide HHrn(currently in the process of requesting authorization for home abx from pt's insurer); List of home health agencies placed in shadow chart).

## 2011-06-24 NOTE — Progress Notes (Addendum)
Subjective: 1 Day Post-Op Procedure(s) (LRB): IRRIGATION AND DEBRIDEMENT KNEE (Right)   Patient reports pain as mild. Pain well controlled with medication. Medication is actually making her a little drowse, she thinks that it is a combination of medication and the lack of sleep recently. Pt blood sugar running a little low, but is correcting with food. No other events and is feeling pretty good at this time.   Objective:   VITALS:   Filed Vitals:   06/24/11 0430  BP: 139/85  Pulse: 74  Temp: 97.4 F (36.3 C)  Resp: 16    Neurovascular intact Incision: dressing C/D/I No cellulitis present Compartment soft  LABS  Basename 06/24/11 0437 06/22/11 1440  HGB 8.8* 10.0*  HCT 28.4* 31.9*  WBC 6.1 4.9  PLT 339 365     Basename 06/24/11 0437 06/22/11 1440  NA 140 134*  K 3.8 4.5  BUN 25* 34*  CREATININE 1.67* 1.88*  GLUCOSE 54* 334*     Assessment/Plan: 1 Day Post-Op Procedure(s) (LRB): IRRIGATION AND DEBRIDEMENT KNEE (Right)   Advance diet D/C IV fluids Dressing change tomorrow PICC line placement today Planning on Ancef x 4weeks   [name] S. [name]   PAC  06/24/2011, 9:45 AM

## 2011-06-24 NOTE — Op Note (Signed)
[name], [name]                ACCOUNT NO.:  [id number]  MEDICAL RECORD NO.:  [id number]  LOCATION:  1621                         FACILITY:  [facility]  PHYSICIAN:  Madlyn Frankel. Charlann Boxer, M.D.  DATE OF BIRTH:  [birth year]  DATE OF PROCEDURE:  06/23/2011 DATE OF DISCHARGE:                              OPERATIVE REPORT   PREOPERATIVE DIAGNOSIS:  Right leg abscess noted over the anteromedial and lateral aspects of the leg or proximal tibia, with a history of previous below-the-knee amputation, and recent exacerbation of posterior chronic leg wound.  POSTOPERATIVE DIAGNOSIS:  Right anteromedial and lateral abscess by MRI evaluation.  FINDINGS: 1. Did not reveal obvious purulent pockets, but instead fluid     collection that was sampled for culture analysis. 2. Chronic posterior proximal leg wound with granulating bed, however,     with macerated and nonviable skin edges.  PROCEDURE: 1. I and D of the right anteromedial and lateral leg abscess with     debridement of subcutaneous tissue, including fibrous scarring as     well as synovial based tight pocket in this region as well as a     large synovial fall of tissue. 2. I and D circumferentially of the posterior leg wound that measured     approximately 8 cm in diameter and involving debridement of skin     and subcutaneous tissue as well as some of the granulating tissue     bed.  SURGEON:  Madlyn Frankel. Charlann Boxer, M.D.  ASSISTANT:  Lanney Gins, PA.  Note that Mr. Carmon Sails was present for the entirety of the case, helped with assistance and management of the larger lower extremity with a history of below-knee amputation.  ANESTHESIA:  General.  SPECIMENS:  I did send fluid samples identified in the anteromedial aspect of the leg.  No tissues were sent.  This was sent to Pathology for Gram stain and culture evaluation.  TOURNIQUET TIME:  Noted to be about 30 minutes at 200 mmHg. Complications drains and used 1 draining sinus wound and  leg wound area of the draining in the next 24-48 hours, and output.  INDICATION FOR PROCEDURES:  [name] is a very pleasant 46 year old female with longstanding history of insulin-dependent diabetes.  She has a history of right below-knee amputation, and presented to the office within the last month or so with an anterior leg wound as well as a large posterior leg wound most likely as a result of the prosthetic wear.  As these were being treated conservatively without any operative intervention, the anterior leg wound healed, but was complicated further by deep pain worked up by MRI to reveal fluid collection concerning for abscess in the anterior, medial and lateral aspects of the leg.  She had this chronic posterior leg wound that had been followed and she had been seen and evaluated by the wound care center and was referred to a plastic surgeon for consideration of some type of grafting procedure.  Based on the MRI findings, it was my recommendation that she have an I and D of the anterior leg to prevent complications or osteomyelitis in the area.  She had been placed on antibiotics at the  time of her presentation due to some concern for cellulitis as well as the potential progression that she dealt with this in the past.  Consent was obtained for the above procedure.  PROCEDURE IN DETAIL:  The patient was brought to the operative theater. Once adequate anesthesia, preoperative antibiotics, Ancef were administered, 2 g, she was positioned supine.  The right thigh tourniquet was placed.  The right lower extremity from the tourniquet to the base of the right below-the-knee amputation stump were prepped and draped sterilely with Betadine scrub and paint.  A time-out was performed identifying the patient, planned procedure, and extremity.  The right leg was exsanguinated, tourniquet elevated to 200 mmHg.  Attention was first directed to the anterior aspect of the knee. I made an  anterior transverse incision below the area of the tibial tubercle.  Sharp dissection was carried down through the subcutaneous tissue.  There was noted to be a very large thickened synovial pocket, which when entered we encountered fluid.  This was not pus.  This had a slight inflammatory appearance to it.  It was cultured, with aerobic, anaerobic cultures that were sent to Pathology for evaluation.  Following this, we evacuated this cavity and then sharply dissected out the synovial capsule including some large synovial balls in this area that potentially could have been a source of irritation for her.  The remainder of the tissue looked relatively healthy.  There was no evidence of any other complicating features medially or laterally.  We then irrigated the anterior wound out with about 2 L of normal saline solution.  At this point, based on the chronicity of the posterior leg wound, it was decided  to excise the nonviable tissue around this edge circumferentially, just debriding the skin edges basically and removing any fibrous tissue that was present in this region.  Once this was done, we irrigated the posterior aspect of the leg with another liter of normal saline solution, pulse lavage.  Once this was done, I placed a medium Hemovac into this synovial pocket in the lower anterior leg, reapproximated skin tissues with 2-0 nylon sutures in an interrupted mattress fashion.  The tourniquet was let down, Xeroform was placed over the anterior wound as well as a sheet over the posterior wound.  She was noted to have proficient bleeding at this posterior aspect, but I felt it was oozing from this large granulating bed.  A large bulky wrap was placed on her knee to apply some compression.  She was placed in a knee immobilizer for rest.  We will apply a PICC line while she is in the hospital and treat her for 4 weeks with IV antibiotics due to the concern for any infection  and concern for osteomyelitis that we want to try to prevent.  We will probably place her on Ancef based on the response that she had to oral antibiotics from the clinic.  The posterior leg wound, we will continue to dress and have her manage this the way she has been dressing this at home.  The patient will follow up with wound care, and subsequent plastic surgery.  I will see her back in the office in 2 weeks time for suture removal.     Madlyn Frankel. Charlann Boxer, M.D.     MDO/MEDQ  D:  06/23/2011  T:  06/24/2011  Job:  [id number]

## 2011-06-24 NOTE — Progress Notes (Signed)
ANTIBIOTIC CONSULT NOTE - Follow-Up  Pharmacy Consult for Ancef Indication: Right knee abscess  Allergies  Allergen Reactions  . Fish Allergy Anaphylaxis and Itching  . Travatan (Travoprost) Itching    Patient Measurements: Height: 6' 3.98" (193 cm) Weight: 317 lb 0.3 oz (143.8 kg) IBW/kg (Calculated) : 82.26   Vital Signs: Temp: 97.4 F (36.3 C) (04/24 0430) Temp src: Oral (04/24 0430) BP: 139/85 mmHg (04/24 0430) Pulse Rate: 74  (04/24 0430) Intake/Output from previous day: 04/23 0701 - 04/24 0700 In: 2223.3 [I.V.:2123.3; IV Piggyback:100] Out: 1575 [Urine:1550; Blood:25] Intake/Output from this shift:    Labs:  Basename 06/24/11 0437 06/22/11 1440  WBC 6.1 4.9  HGB 8.8* 10.0*  PLT 339 365  LABCREA -- --  CREATININE 1.67* 1.88*   Estimated Creatinine Clearance: 69.5 ml/min (by C-G formula based on Cr of 1.67). No results found for this basename: VANCOTROUGH:2,VANCOPEAK:2,VANCORANDOM:2,GENTTROUGH:2,GENTPEAK:2,GENTRANDOM:2,TOBRATROUGH:2,TOBRAPEAK:2,TOBRARND:2,AMIKACINPEAK:2,AMIKACINTROU:2,AMIKACIN:2, in the last 72 hours   Microbiology: Recent Results (from the past 720 hour(s))  SURGICAL PCR SCREEN     Status: Normal   Collection Time   06/22/11  1:42 PM      Component Value Range Status Comment   MRSA, PCR NEGATIVE  NEGATIVE  Final    Staphylococcus aureus NEGATIVE  NEGATIVE  Final   CULTURE, ROUTINE-ABSCESS     Status: Normal (Preliminary result)   Collection Time   06/23/11  5:13 PM      Component Value Range Status Comment   Specimen Description ABSCESS RIGHT KNEE   Final    Special Requests NONE   Final    Gram Stain     Final    Value: NO WBC SEEN     NO ORGANISMS SEEN     Gram Stain Report Called to,Read Back By and Verified With: Gram Stain Report Called to,Read Back By and Verified With: J PLATT 1805 06/23/11 MURPHYD  Performed at [facility]   Culture NO GROWTH   Final    Report Status PENDING   Incomplete   GRAM STAIN     Status: Normal   Collection Time   06/23/11  5:13 PM      Component Value Range Status Comment   Specimen Description ABSCESS RIGHT KNEE   Final    Special Requests NONE   Final    Gram Stain     Final    Value: NO WBC SEEN     NO ORGANISMS SEEN     Gram Stain Report Called to,Read Back By and Verified With: J. PLATT/[id number]/[email or web address]   Report Status 06/23/2011 FINAL   Final     Medical History: Past Medical History  Diagnosis Date  . Hypertension   . Thyroid disease   . Hypothyroidism   . Diabetic retinopathy   . Thalassemia minor   . Charcot ankle     AND FOOT - LEFT  . Dysrhythmia     PAST HX SVT--HAD HEART ABLATION 2002--NO PROBLEMS SINCE  . Osteomyelitis of foot 1993    PT REQUIRED BELOW THE KNEE AMPUTATION   . Diabetes mellitus     PT HAS INSULIN PUMP-DR. ALTHEIMER MANAGES PT'S PUMP  . Blood transfusion   . Chronic kidney disease     RENAL INSUFFICIENCY  . GERD (gastroesophageal reflux disease)     PT WOULD TAKE PROTONIX IF HAVING GERD  . Headache     2010--DX WITH CLUSTER H/A'S -JUST OCCAS BAD HEADACHE NOW  . 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 Depression   . Anxiety   . Sleep apnea     PT STATES SLEEP STUDY ABOUT 13 YRS AGO-TOLD SHE HAD SLEEP APNEA BUT NEVER GIVEN CPAP    Medications:  Scheduled:     . atorvastatin  40 mg Oral QHS  . carvedilol  6.25 mg Oral BID WC  .  ceFAZolin (ANCEF) IV  1 g Intravenous Q6H  .  ceFAZolin (ANCEF) IV  2 g Intravenous Once  . dextrose  25 mL Intravenous Once  . docusate sodium  100 mg Oral BID  . escitalopram  20 mg Oral QPC breakfast  . felodipine  5 mg Oral QPC breakfast  . ferrous sulfate  325 mg Oral TID PC  . hydrochlorothiazide  12.5 mg Oral Daily  . HYDROcodone-acetaminophen  1-2 tablet Oral Q4H  . insulin aspart  0-15 Units Subcutaneous TID WC  . insulin pump  1 each Subcutaneous See admin instructions  . irbesartan  300 mg Oral Daily  . levothyroxine  250 mcg Oral  QAC breakfast  . loratadine  10 mg Oral Daily  . DISCONTD: chlorhexidine  60 mL Topical Once  . DISCONTD: olmesartan-hydrochlorothiazide  1 tablet Oral QHS   Infusions:     . sodium chloride 0.9 % 1,000 mL with potassium chloride 10 mEq infusion 100 mL/hr at 06/24/11 0633  . DISCONTD: lactated ringers     Assessment:  46 year old female with right BKA s/p I&D of right knee abscess and leg wound   S/P fall 2 months ago and with swelling in knee/stump; pt with prosthetic leg  Plans for 4 weeks of IV Ancef noted.   For PICC placement today.  Goal of Therapy:  Eradication of infection  Plan:  1. Given pt weight and renal function, will adjust Ancef dosage to 2 grams IV q8h. 2. Await culture results.  Elie Goody, PharmD, BCPS Pager: [phone] 06/24/2011  10:28 AM

## 2011-06-25 LAB — CBC
MCH: 21.7 pg — ABNORMAL LOW (ref 26.0–34.0)
MCHC: 31.3 g/dL (ref 30.0–36.0)
MCV: 69.4 fL — ABNORMAL LOW (ref 78.0–100.0)
Platelets: 308 10*3/uL (ref 150–400)
RDW: 13.9 % (ref 11.5–15.5)

## 2011-06-25 LAB — GLUCOSE, CAPILLARY
Glucose-Capillary: 58 mg/dL — ABNORMAL LOW (ref 70–99)
Glucose-Capillary: 94 mg/dL (ref 70–99)

## 2011-06-25 LAB — BASIC METABOLIC PANEL
BUN: 24 mg/dL — ABNORMAL HIGH (ref 6–23)
Calcium: 8.5 mg/dL (ref 8.4–10.5)
Creatinine, Ser: 1.72 mg/dL — ABNORMAL HIGH (ref 0.50–1.10)
GFR calc Af Amer: 39 mL/min — ABNORMAL LOW (ref >90)

## 2011-06-25 MED ORDER — METHOCARBAMOL 500 MG PO TABS: 500.0000 mg | ORAL_TABLET | Freq: Four times a day (QID) | ORAL | Status: AC | PRN

## 2011-06-25 MED ORDER — ASPIRIN EC 325 MG PO TBEC: 325.0000 mg | DELAYED_RELEASE_TABLET | Freq: Every day | ORAL | Status: AC

## 2011-06-25 MED ORDER — CEFAZOLIN SODIUM-DEXTROSE 2-3 GM-% IV SOLR: 2.0000 g | Freq: Three times a day (TID) | INTRAVENOUS | Status: DC

## 2011-06-25 MED ORDER — HYDROCODONE-ACETAMINOPHEN 7.5-325 MG PO TABS: 1.0000 | ORAL_TABLET | Freq: Four times a day (QID) | ORAL | Status: AC | PRN

## 2011-06-25 NOTE — Discharge Summary (Signed)
Physician Discharge Summary  Patient ID: [name] MRN: [id number] DOB/AGE: 1965-05-15 46 y.o.  Admit date: 06/23/2011 Discharge date:  06/26/2011  Procedures:  Procedure(s) (LRB): IRRIGATION AND DEBRIDEMENT KNEE (Right)  Attending Physician:  Dr. 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 female complaining of pain and open sore on the end of her right stump. Patient had a previous right BKA approximately 20 years ago. This had been doing well for her up until about a month ago. The patient states that, on April 16, 2011, she went out to [facility] to get some wings. As she was leaving the [employer]urant, her prosthetic leg got caught on a mat, causing her to trip and fall, landing on her right knee. Since that time, she's had swelling in the right knee and distal stump. She states, however, that she's continuing to wear her prosthetic leg which has caused her skin to rub on the prosthesis and the skin has started to break down. She complains of 2 wounds on the front of her leg and one at the back with continued swelling of the distal stump. The patient also has 2 wounds on the anterior aspect of the right stump, both measuring approximately 1 cm. x 3 cm., one medial and lateral. Patient also has a wound on the posterior aspect of the right stump closer to the crease behind the right knee measuring approximately 2-3 cm. by 7-8 cm.She tried some salt water soaks, placed on doxycycline and told not to use her prosthesis to allow the area to rest. Subsequent visits revealed that the anterior wounds were healing well but the posterior was still opened, however not draining. Another follow up visit the the pt stated that she  did at time not feel well and an MRI of the right stump was ordered. The MRI revealed abscess of both the anterior medial and lateral aspects of the right stump. Various options are discussed with the patient do to the findings of the MRI and the progress of the wounds not feeling. Risks, benefits and expectations were discussed with the patient. Patient understand the risks, benefits and expectations and wishes to proceed with surgery.   PCP: [name]., MD, MD   Discharged Condition: good  Hospital Course:  Patient underwent the above stated procedure on 06/23/2011. Patient tolerated the procedure well and brought to the recovery room in good condition and subsequently to the floor.  POD #1 BP: 139/85 ; Pulse: 74 ; Temp: 97.4 F (36.3 C) ; Resp: 16  IV was changed to a saline lock. Patient reports pain as mild. Pain well controlled with medication. Medication is actually making her a little drowse, she thinks that it is a combination of medication and the lack of sleep recently. Pt blood sugar running a little low, but is correcting with food. No other events and is feeling pretty good at this time.  Neurovascular intact, incision: dressing C/D/I, no cellulitis present and compartment soft.   LABS  Basename  06/24/11 0437   HGB  8.8  HCT  28.4   POD #2  BP: 133/61 ; Pulse: 78 ; Temp: 98.6 F (37 C) ; Resp: 16  Patient reports pain as mild. Pain well controlled.  No events throughout the night. Little sad having to go through all the events, but overall she is doing good. HV drain d/c'ed. Dressings changed without difficulty, she tolerated it well. Neurovascular intact, incision: dressing C/D/I, no cellulitis present and compartment soft.   LABS  Basename  06/25/11 0535   HGB  8.3  HCT  26.5   POD #3  BP: 134/79 ; Pulse: 77 ; Temp: 97.9 F (36.6 C) ; Resp: 16  Patient reports pain as mild/none. Pain well controlled. Another incident of hypoglycemia yesterday, but controlled  now. She states that she is eating less than usual while in the hospital and that her pump will be better at controlling her diabetes when she gets home. Already planning for [facility] to come to the house for the IV antibiotics later today. She is ready for discharge home.  Neurovascular intact, incision: dressing C/D/I, no cellulitis present and compartment soft.   LABS   No new labs   Discharge Exam: General appearance: alert, cooperative and no distress Extremities: no edema, redness or tenderness in the calf or thighs  Disposition: Home  with follow up in 2 weeks  Follow-up Information    Follow up with OLIN,Senya Hinzman D in 2 weeks.   Contact information:   [facility] [address], [address] [place] [address] [phone]          Discharge Orders    Future Orders Please Complete By Expires   Diet - low sodium heart healthy      Call MD / Call 911      Comments:   If you experience chest pain or shortness of breath, CALL 911 and be transported to the hospital emergency room.  If you develope a fever above 101 F, pus (white drainage) or increased drainage or redness at the wound, or calf pain, call your surgeon's office.   Constipation Prevention      Comments:   Drink plenty of fluids.  Prune juice may be helpful.  You may use a stool softener, such as Colace (over the counter) 100 mg twice a day.  Use MiraLax (over the counter) for constipation as needed.   Increase activity slowly as tolerated      Weight Bearing as taught in Physical Therapy      Comments:   Use a walker or crutches as instructed.   Driving restrictions      Comments:   No driving for 4 weeks   Change dressing      Comments:   Daily dressing changes with petroleum dressing to prevent sticking, gauze and krelix wrappings. Keep the area dry and clean until follow up. Can use an ACE bandage to add some compression to the right leg.Do not use prosthetic leg until wound  has healed and it's been checked out.   Discharge instructions      Comments:   Daily dressing changes with petroleum dressing to prevent sticking, gauze and kerlix wrappings.   Keep the area dry and clean until follow up. Can use an ACE bandage to add some compression to the right leg.Do not use prosthetic leg until wound has healed and it's been checked out.  Follow up in 2 weeks at [facility]. Call with any questions or concerns.        Current Discharge Medication List    START taking these medications   Details  ceFAZolin (ANCEF) 2-3 GM-% SOLR Inject 50 mLs (2 g total) into the vein every 8 (eight)  hours. Qty: 84 each, Refills: 0   Comments: Appropriate amount for 3 times a day for 4 weeks.    diphenhydrAMINE (BENADRYL) 25 mg capsule Take 1 capsule (25 mg total) by mouth every 6 (six) hours as needed for itching, allergies or sleep.    docusate sodium 100 MG CAPS Take 100 mg by mouth 2 (two) times daily.    ferrous sulfate 325 (65 FE) MG tablet Take 1 tablet (325 mg total) by mouth 3 (three) times daily after meals.    HYDROcodone-acetaminophen (NORCO) 7.5-325 MG per tablet Take 1-2 tablets by mouth every 6 (six) hours as needed for pain. Qty: 100 tablet, Refills: 0    methocarbamol (ROBAXIN) 500 MG tablet Take 1 tablet (500 mg total) by mouth every 6 (six) hours as needed (muscle spasms). Qty: 50 tablet, Refills: 0    polyethylene glycol (MIRALAX / GLYCOLAX) packet Take 17 g by mouth daily as needed.      CONTINUE these medications which have CHANGED   Details  aspirin EC 325 MG tablet Take 1 tablet (325 mg total) by mouth daily. X 4 weeks Qty: 30 tablet, Refills: 0      CONTINUE these medications which have NOT CHANGED   Details  atorvastatin (LIPITOR) 40 MG tablet Take 40 mg by mouth at bedtime.     carvedilol (COREG) 6.25 MG tablet Take 6.25 mg by mouth 2 (two) times daily.     Cholecalciferol (VITAMIN D-3) 5000 UNITS TABS Take 5,000 Units by mouth  daily.    escitalopram (LEXAPRO) 20 MG tablet Take 20 mg by mouth daily after breakfast.    felodipine (PLENDIL) 5 MG 24 hr tablet Take 5 mg by mouth daily after breakfast.     fexofenadine (ALLEGRA) 180 MG tablet Take 180 mg by mouth daily.    Insulin Human (INSULIN PUMP) 100 unit/ml SOLN Inject 1 each into the skin See admin instructions. Pt has Humalog insulin - 2.5 units per hour.  NOTE: PT'S PUMP BROKE THIS WEEKEND--AS OF Sunday 06/21/11-  SHE WAS TAKING 54 UNITS OF LANTUS SQ IN THE AM AND FOLLOWING CORRECTION FACTOR WITH HUMALOG INSULIN--CALCULATING HER CARB INTAKE  AND BLOOD SUGARS.  PT'S NEW PUMP ARRIVED TODAY 06/22/11--PT CHECKING WITH DR. Berline Lopes OFFICE FOR INSTRUCTION ABOUT HER INSULIN SINCE SURGERY IS PLANNED TOMORROW 06/23/11.    levothyroxine (SYNTHROID, LEVOTHROID) 125 MCG tablet Take 250 mcg by mouth daily before breakfast.    mometasone (NASONEX) 50 MCG/ACT nasal spray Place 2 sprays into the nose daily as needed. Allergies     olmesartan-hydrochlorothiazide (BENICAR HCT) 40-12.5 MG per tablet Take 1 tablet by mouth at bedtime.    Olopatadine HCl (PATADAY) 0.2 % SOLN Apply 1 drop to eye daily as needed. Allergies     Olopatadine HCl (PATANASE) 0.6 % SOLN Place 1 drop into the nose daily as needed. Allergies     OVER THE COUNTER MEDICATION SALINE NASAL SPRAY -OTC--WOULD DO ONE SPRAY EACH NOSTRIL PRIOR TO USING ANY OTHER NASAL SPRAY FOR HER ALLERGIES    Multiple Vitamin (MULITIVITAMIN WITH MINERALS) TABS Take 1 tablet by mouth daily.    omega-3 acid ethyl esters (LOVAZA) 1 G capsule Take 2 g by mouth daily.    pantoprazole (PROTONIX) 20 MG tablet Take 20 mg by mouth. ONCE A DAY AS NEEDED--ONLY IF HAVING REFLUX-DOES NOT TAKE EVERYDAY      STOP taking these medications     acetaminophen (TYLENOL) 500 MG tablet Comments:  Reason for Stopping:  ferrous sulfate 325 (65 FE) MG EC tablet Comments:  Reason for Stopping:       Insulin Lispro, Human, (HUMALOG )  Comments:  Reason for Stopping:           Signed:  Anastasio Auerbach. Arbor Cohen   PAC  06/26/2011, 7:55 AM

## 2011-06-25 NOTE — Progress Notes (Signed)
Inpatient Diabetes Program Recommendations  AACE/ADA: New Consensus Statement on Inpatient Glycemic Control (2009)  Target Ranges:  Prepandial:   less than 140 mg/dL      Peak postprandial:   less than 180 mg/dL (1-2 hours)      Critically ill patients:  140 - 180 mg/dL   Reason for Visit: Hyoglycemia ac lunch today.   Note: Will recheck cbg this afternoon.  If having more hypoglcyemia, may need to adjust her insulin to carb ratio via her insulin pump from 1 unit per 5 grams to 1 unit per 10 grams. Thank you, Lenor Coffin, RN, CNS, Diabetes Coordinator [phone])

## 2011-06-25 NOTE — Progress Notes (Signed)
Inpatient Diabetes Program Recommendations  AACE/ADA: New Consensus Statement on Inpatient Glycemic Control (2009)  Target Ranges:  Prepandial:   less than 140 mg/dL      Peak postprandial:   less than 180 mg/dL (1-2 hours)      Critically ill patients:  140 - 180 mg/dL   Reason for Visit: Basal rates in temporary settting of 80% the usual One hypoglycemic event before lunch today of 58, then corrected to 76 with juice. Asked patient if she would like to adjust her insulin to carb ratio from 1 unit per 5 gms to 1 unit per 10 grams; however, she states she would like to keep her present bolus and basal settings as are. Will recheck patient in am.   Note: Thank you, Lenor Coffin, RN, CNS, Diabetes Coordinator [phone])

## 2011-06-25 NOTE — Progress Notes (Addendum)
Subjective: 2 Days Post-Op Procedure(s) (LRB): IRRIGATION AND DEBRIDEMENT KNEE (Right)   Patient reports pain as mild. Pain well controlled. No events throughout the night. Little sad having to go through all the events, but overall she is doing good.   Objective:   VITALS:   Filed Vitals:   06/25/11 0725  BP: 133/61  Pulse: 78  Temp: 98.6 F (37 C)  Resp: 16    Neurovascular intact Incision: dressing C/D/I No cellulitis present Compartment soft  LABS  Basename 06/25/11 0535 06/24/11 0437 06/22/11 1440  HGB 8.3* 8.8* 10.0*  HCT 26.5* 28.4* 31.9*  WBC 5.0 6.1 4.9  PLT 308 339 365     Basename 06/25/11 0535 06/24/11 0437 06/22/11 1440  NA 137 140 134*  K 4.3 3.8 4.5  BUN 24* 25* 34*  CREATININE 1.72* 1.67* 1.88*  GLUCOSE 105* 54* 334*     Assessment/Plan: 2 Days Post-Op Procedure(s) (LRB): IRRIGATION AND DEBRIDEMENT KNEE (Right)   Continue current treatment plan. Will be discharged home tomorrow if everything continues to do well.   Anastasio Auerbach Dacian Orrico   PAC  06/25/2011, 10:51 AM

## 2011-06-26 LAB — GLUCOSE, CAPILLARY
Glucose-Capillary: 46 mg/dL — ABNORMAL LOW (ref 70–99)
Glucose-Capillary: 38 mg/dL — CL (ref 70–99)
Glucose-Capillary: 71 mg/dL (ref 70–99)

## 2011-06-26 MED ORDER — HEPARIN SOD (PORK) LOCK FLUSH 100 UNIT/ML IV SOLN
250.0000 [IU] | INTRAVENOUS | Status: DC | PRN
  Filled 2011-06-26: qty 3

## 2011-06-26 MED ORDER — GLUCOSE-VITAMIN C 4-6 GM-MG PO CHEW
CHEWABLE_TABLET | ORAL | Status: AC
  Filled 2011-06-26: qty 1

## 2011-06-26 MED ORDER — HEPARIN SOD (PORK) LOCK FLUSH 100 UNIT/ML IV SOLN
250.0000 [IU] | Freq: Every day | INTRAVENOUS | Status: DC
  Filled 2011-06-26: qty 3

## 2011-06-26 NOTE — Progress Notes (Signed)
Inpatient Diabetes Program Recommendations  AACE/ADA: New Consensus Statement on Inpatient Glycemic Control (2009)  Target Ranges:  Prepandial:   less than 140 mg/dL      Peak postprandial:   less than 180 mg/dL (1-2 hours)      Critically ill patients:  140 - 180 mg/dL   Reason for Visit: Hypoglycemia following meals Fasting glucose this am at 120.  Pt ate 60 grams cho for breakfast and bolused 12 units for breakfast using a 1 unit per 5 grams cho.  Within 2 hrs following breakfast, glucose dropped to 38 then 46 mg/dL followed by last check of 71 mg/dL.  I had asked pt if she could change this ratio yesterday, as she was following the pattern of post-prandial hypoglycemia since the 24th of April.  However, pt did not yet want to change the carb coverage, as she hoped the hypoglycemia would resolve on its own.  This am, she agreed that she should change the ratio to 1 unit per 10 grams carbohydrate.  Contacted Dr. Altheimer's office and left message with their diabetes educator Ina Kick, CDE regarding the change made.  Had spoken with Dr. Leslie Dales on the 24th who agreed that the first step would be to reduce the basal rates which we did put the pump in temporary basal of 80% for 24 hrs.  At this point, with patient's hypoglycemia following po intake and a bolus of 12 units per 60 gram carb meal, she lowered her insulin to carb ratio to 1 unit per 10 grams cho to prevent further hypoglycemia until she sees Dr Altheimer/staff next week. Should patient need to change her ratio back to 1 unit per 5 gms cho, she is ablel to do.  Gave her my phone contact info.    Note: Thank you, Lenor Coffin, RN, CNS, Diabetes Coordinator [phone])

## 2011-06-26 NOTE — Progress Notes (Signed)
Discharge summary sent to payer through [facility]  

## 2011-06-26 NOTE — Progress Notes (Signed)
Pt d/cd home via ambulance. Verbalizes understanding of dc instructions and follow up appointment.

## 2011-06-26 NOTE — Progress Notes (Signed)
Subjective: 3 Days Post-Op Procedure(s) (LRB): IRRIGATION AND DEBRIDEMENT KNEE (Right)   Patient reports pain as mild/none. Pain well controlled. Another incident of hypoglycemia yesterday, but controlled now. She states that she is eating less than usual while in the hospital and that her pump will be better at controlling her diabetes when she gets home.  Already planning for [facility] to come to the house for the IV antibiotics later today.  She is ready for discharge home.   Objective:   VITALS:   Filed Vitals:   06/26/11 0530  BP: 134/79  Pulse: 77  Temp: 97.9 F (36.6 C)  Resp: 16    Neurovascular intact Incision: dressing C/D/I No cellulitis present Compartment soft  LABS  Basename 06/25/11 0535 06/24/11 0437  HGB 8.3* 8.8*  HCT 26.5* 28.4*  WBC 5.0 6.1  PLT 308 339     Basename 06/25/11 0535 06/24/11 0437  NA 137 140  K 4.3 3.8  BUN 24* 25*  CREATININE 1.72* 1.67*  GLUCOSE 105* 54*     Assessment/Plan: 3 Days Post-Op Procedure(s) (LRB): IRRIGATION AND DEBRIDEMENT KNEE (Right)   Discharge home with home health today Follow up in 2 weeks at [facility].  Follow-up Information    Follow up with OLIN,Leontina Skidmore D in 2 weeks.   Contact information:   [facility] [address], [address] [place] [address] [phone]             Anastasio Auerbach. [name]   PAC  06/26/2011, 7:44 AM

## 2011-06-27 LAB — CULTURE, ROUTINE-ABSCESS

## 2011-06-28 LAB — ANAEROBIC CULTURE

## 2011-06-29 ENCOUNTER — Encounter (HOSPITAL_COMMUNITY): Payer: Self-pay | Admitting: Orthopedic Surgery

## 2011-07-08 ENCOUNTER — Encounter (HOSPITAL_BASED_OUTPATIENT_CLINIC_OR_DEPARTMENT_OTHER): Payer: BC Managed Care – PPO | Attending: Plastic Surgery

## 2011-07-08 DIAGNOSIS — L97809 Non-pressure chronic ulcer of other part of unspecified lower leg with unspecified severity: Secondary | ICD-10-CM | POA: Insufficient documentation

## 2011-07-08 DIAGNOSIS — S88119A Complete traumatic amputation at level between knee and ankle, unspecified lower leg, initial encounter: Secondary | ICD-10-CM | POA: Insufficient documentation

## 2011-07-09 NOTE — Progress Notes (Signed)
[facility]  NAME:  [name], [name] NO.:  [id number]  MEDICAL RECORD NO.:  [id number]      DATE OF BIRTH:  Jan 04, 1966  PHYSICIAN:  Wayland Denis, DO       VISIT DATE:  07/08/2011                                  OFFICE VISIT   The patient is a 46 year old female who is here for followup after a right lower extremity amputation, below knee.  She has an ulcer on the back of her leg and has been using collagen on the area.  The amputation site, the skin is healing really well.  Unfortunately, she has quite a hematoma in the area.  The skin looks good.  It does not look to be infected, but there is a lot of pressure and fluctuance.  REVIEW OF SYSTEMS:  Negative.  No change in her medications or social history.  EXAM:  GENERAL:  She is alert, oriented, cooperative, not in any acute distress.  She is very pleasant and seems to have a great attitude. EYES:  Her pupils are equal.  Extraocular muscles are intact. NECK:  No cervical lymphadenopathy. RESPIRATORY:  Breathing is unlabored. CARDIOVASCULAR:  Her heart is regular. ABDOMEN:  Soft. SKIN:  The wound is as described above.  I spoke with the orthopedic surgeon to be sure it was okay with him for me to drain hematoma i.e., after cleaning it sterilely, I stuck an 18- gauge needle and I was able to drop 30 mL of fluid, but due to the fact that the surgery was 3 weeks ago, there was probably quite a bit of loculated blood in there so I used 1% lidocaine with epinephrine on the medial aspect of the incision, took out 2 sutures, numbed her up, waited several minutes, and then just put a 15 blade in to open up the incision, and we were able to drain out likely 100 mL of fluid.  This helped the area decrease in its tightness significantly.  We will use Silvercel in that area and silver collagen on the posterior aspect and then have her follow up in 1 week.     Wayland Denis,  DO     CS/MEDQ  D:  07/08/2011  T:  07/08/2011  Job:  [id number]

## 2011-07-15 NOTE — Progress Notes (Signed)
[facility]  NAME:  [name], [name] NO.:  [id number]  MEDICAL RECORD NO.:  [id number]      DATE OF BIRTH:  08-15-1965  PHYSICIAN:  Wayland Denis, DO       VISIT DATE:  07/15/2011                                  OFFICE VISIT   The patient is a 46 year old female who had a right lower extremity below-knee amputation.  She has a wound on the posterior aspect of her knee and also fluid retention in the stump.  She was drained last week and had a large amount of blood and hematoma.  We were able to clear it and at the end of the visit, she had much less pressure and it looks really good.  Today the incision is actually healing but the fluid has recollected.  She had some remnants of hematoma and fluid collection. It does not look infected but this is concerning for leading to infection. The posterior wound is looking a little bit better.  There has been no change in her medications or social history.  On exam, she is alert, oriented, cooperative, not in any acute distress.  Her breathing is unlabored.  Her pulses in upper extremities are equal bilaterally.  The stump site and wound is as noted above.  The fluid was drained.  We will continue with collagen, ACell, and Aquacel Ag at the site of the drainage.  She has an appointment with her orthopedist which I highly recommend that she keep and be sure to tell him about the fluid collection and we will see her back in a week.     Wayland Denis, DO     CS/MEDQ  D:  07/15/2011  T:  07/15/2011  Job:  [id number]

## 2011-07-17 ENCOUNTER — Encounter (HOSPITAL_COMMUNITY): Payer: Self-pay | Admitting: *Deleted

## 2011-07-17 ENCOUNTER — Encounter (HOSPITAL_COMMUNITY): Payer: Self-pay | Admitting: Pharmacy Technician

## 2011-07-17 NOTE — Pre-Procedure Instructions (Signed)
Called and talked to Bertram Gala Diabetes Co-ordinator to let her know of pt. Having surgery 07/20/2011 and has an Insulin pump.

## 2011-07-17 NOTE — H&P (Signed)
[name] is an 46 y.o. female.    Chief Complaint:  Recurrent hematoma in right leg s/p I&D of right leg abscess  HPI:  Pt is a 46 y.o. female complaining persistent drainage from incision from the I&D of the right leg abscess on 06/23/2011.  When seen in the office she has persistent drainage from what is diagnosised as recurrent hematomas. Patient had a previous right BKA approximately 20 years ago. This had been doing well for her up until about a month ago. The patient states that, on April 16, 2011, she went out to [facility] to get some wings. As she was leaving the [employer]ur[employer], her prosthetic leg got caught on a mat, causing her to trip and fall, landing on her right knee. Since that time, she has had an I&D of the right leg which has been doing well except for the constant draining.  Risks, benefits and expectations were discussed with the patient. Patient understand the risks, benefits and expectations and wishes to proceed with surgery.    PCP:  Alva Garnet., MD, MD  D/C Plans:  Home with HHPT  Post-op Meds:  No Rx given  Tranexamic Acid:   Not to be given  Decadron:   Not to be given  PMH: Past Medical History  Diagnosis Date  . Hypertension   . Thyroid disease   . Hypothyroidism   . Diabetic retinopathy   . Thalassemia minor   . Charcot ankle     AND FOOT - LEFT  . Dysrhythmia     PAST HX SVT--HAD HEART ABLATION 2002--NO PROBLEMS SINCE  . Osteomyelitis of foot 1993    PT REQUIRED BELOW THE KNEE AMPUTATION   . Diabetes mellitus     PT HAS INSULIN PUMP-DR. ALTHEIMER MANAGES PT'S PUMP  . Blood transfusion   . Chronic kidney disease     RENAL INSUFFICIENCY  . GERD (gastroesophageal reflux disease)     PT WOULD TAKE PROTONIX IF HAVING GERD  . Headache     2010--DX WITH CLUSTER H/A'S -JUST OCCAS BAD HEADACHE NOW  . 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 Depression   . Anxiety   . Sleep apnea    PT STATES SLEEP STUDY ABOUT 13 YRS AGO-TOLD SHE HAD SLEEP APNEA BUT NEVER GIVEN CPAP    PSH: Past Surgical History  Procedure Date  . Below knee leg amputation     RIGHT  . Thyroidectomy   . Retinal detachment repair w/ scleral buckle le   . Transmetatarsal amputation left foot   . Breast surgery     RIGHT BREAST LUMPECTOMY-BENIGN  . Eye surgery     BILATERAL CATARACT EXTRACTION  . Joint replacement 2011    LEFT TOTAL HIP ARTHROPLASTY  . Carpal tunnel release   . Irrigation and debridement knee 06/23/2011    Procedure: IRRIGATION AND DEBRIDEMENT KNEE;  Surgeon: Shelda Pal, MD;  Location: [facility];  Service: Orthopedics;  Laterality: Right;  Irrigation and Debridement of Right Knee Abscess (below knee amputation) and posterior leg wound    Social History:  reports that she has never smoked. She has never used smokeless tobacco. She reports that she drinks alcohol. She reports that she does not use illicit drugs.  Allergies:  Allergies  Allergen Reactions  . Fish Allergy Anaphylaxis and Itching  . Travatan (Travoprost) Itching    Medications: No current facility-administered medications for this encounter.  Current Outpatient Prescriptions  Medication Sig Dispense Refill  . atorvastatin (LIPITOR) 40 MG tablet Take 40 mg by mouth at bedtime.       . carvedilol (COREG) 6.25 MG tablet Take 6.25 mg by mouth 2 (two) times daily.       Marland Kitchen ceFAZolin (ANCEF) 2-3 GM-% SOLR Inject 50 mLs (2 g total) into the vein every 8 (eight) hours.  84 each  0  . Cholecalciferol (VITAMIN D-3) 5000 UNITS TABS Take 5,000 Units by mouth daily.      . diphenhydrAMINE (BENADRYL) 25 mg capsule Take 1 capsule (25 mg total) by mouth every 6 (six) hours as needed for itching, allergies or sleep.      Marland Kitchen escitalopram (LEXAPRO) 20 MG tablet Take 20 mg by mouth daily after breakfast.      . felodipine (PLENDIL) 5 MG 24 hr tablet Take 5 mg by mouth daily after breakfast.       . ferrous sulfate 325 (65 FE) MG  tablet Take 1 tablet (325 mg total) by mouth 3 (three) times daily after meals.      . fexofenadine (ALLEGRA) 180 MG tablet Take 180 mg by mouth daily.      . Insulin Human (INSULIN PUMP) 100 unit/ml SOLN Inject 1 each into the skin See admin instructions. Pt has Humalog insulin - 2.5 units per hour.  NOTE: PT'S PUMP BROKE THIS WEEKEND--AS OF Sunday 06/21/11-  SHE WAS TAKING 54 UNITS OF LANTUS SQ IN THE AM AND FOLLOWING CORRECTION FACTOR WITH HUMALOG INSULIN--CALCULATING HER CARB INTAKE  AND BLOOD SUGARS.  PT'S NEW PUMP ARRIVED TODAY 06/22/11--PT CHECKING WITH DR. Berline Lopes OFFICE FOR INSTRUCTION ABOUT HER INSULIN SINCE SURGERY IS PLANNED TOMORROW 06/23/11.      Marland Kitchen levothyroxine (SYNTHROID, LEVOTHROID) 125 MCG tablet Take 250 mcg by mouth daily before breakfast.      . mometasone (NASONEX) 50 MCG/ACT nasal spray Place 2 sprays into the nose daily as needed. Allergies       . Multiple Vitamin (MULITIVITAMIN WITH MINERALS) TABS Take 1 tablet by mouth daily.      Marland Kitchen olmesartan-hydrochlorothiazide (BENICAR HCT) 40-12.5 MG per tablet Take 1 tablet by mouth at bedtime.      . Olopatadine HCl (PATADAY) 0.2 % SOLN Apply 1 drop to eye daily as needed. Allergies       . Olopatadine HCl (PATANASE) 0.6 % SOLN Place 1 drop into the nose daily as needed. Allergies       . omega-3 acid ethyl esters (LOVAZA) 1 G capsule Take 2 g by mouth daily.      Marland Kitchen OVER THE COUNTER MEDICATION SALINE NASAL SPRAY -OTC--WOULD DO ONE SPRAY EACH NOSTRIL PRIOR TO USING ANY OTHER NASAL SPRAY FOR HER ALLERGIES      . pantoprazole (PROTONIX) 20 MG tablet Take 20 mg by mouth. ONCE A DAY AS NEEDED--ONLY IF HAVING REFLUX-DOES NOT TAKE EVERYDAY        ROS: Review of Systems  Constitutional: Negative.   HENT: Negative.   Eyes: Negative.   Respiratory: Negative.   Cardiovascular: Negative.   Gastrointestinal: Negative.   Genitourinary: Negative.   Musculoskeletal: Positive for joint pain.  Skin: Negative.   Neurological: Negative.     Endo/Heme/Allergies: Positive for environmental allergies.  Psychiatric/Behavioral: Negative.      Physical Exam: Physical Exam  Constitutional: She is oriented to person, place, and time and well-developed, well-nourished, and in no distress.  HENT:  Head: Normocephalic and atraumatic.  Nose: Nose normal.  Mouth/Throat: Oropharynx is  clear and moist.  Eyes: Pupils are equal, round, and reactive to light.  Neck: Neck supple. No JVD present. No tracheal deviation present. No thyromegaly present.  Cardiovascular: Normal rate, regular rhythm and normal heart sounds.   Pulmonary/Chest: Effort normal and breath sounds normal. No respiratory distress. She has no wheezes.  Abdominal: Soft. There is no tenderness. There is no guarding.  Musculoskeletal:       Right knee: She exhibits swelling, ecchymosis, deformity (BKA with open wound on the posterior aspect of the right knee crease, and an open area of the incision with drainage) and laceration (well healing except one area of drainageon the medial aspect). She exhibits normal range of motion. tenderness found.  Lymphadenopathy:    She has no cervical adenopathy.  Neurological: She is alert and oriented to person, place, and time.  Skin: Skin is warm and dry.  Psychiatric: Affect normal.      Assessment/Plan Assessment:  Recurrent hematoma in right leg s/p I&D of right leg abscess   Plan: Patient will undergo a I&D of the right leg hematoma on 07/20/2011 per Dr. Charlann Boxer at [facility]. Risks benefits and expectation were discussed with the patient. Patient understand risks, benefits and expectation and wishes to proceed.   Anastasio Auerbach Marwah Disbro   PAC  07/17/2011, 12:21 PM

## 2011-07-20 ENCOUNTER — Encounter (HOSPITAL_COMMUNITY): Payer: Self-pay | Admitting: Anesthesiology

## 2011-07-20 ENCOUNTER — Inpatient Hospital Stay (HOSPITAL_COMMUNITY)
Admission: RE | Admit: 2011-07-20 | Discharge: 2011-07-24 | DRG: 226 | Disposition: A | Discharge status: Home Health | Payer: BC Managed Care – PPO | Source: Ambulatory Visit | Attending: Orthopedic Surgery | Admitting: Orthopedic Surgery

## 2011-07-20 ENCOUNTER — Encounter (HOSPITAL_COMMUNITY): Payer: Self-pay | Admitting: *Deleted

## 2011-07-20 ENCOUNTER — Encounter (HOSPITAL_COMMUNITY)
Admission: RE | Disposition: A | Discharge status: Home Health | Payer: Self-pay | Source: Ambulatory Visit | Attending: Orthopedic Surgery

## 2011-07-20 ENCOUNTER — Ambulatory Visit (HOSPITAL_COMMUNITY): Payer: BC Managed Care – PPO | Admitting: Anesthesiology

## 2011-07-20 DIAGNOSIS — E1139 Type 2 diabetes mellitus with other diabetic ophthalmic complication: Secondary | ICD-10-CM | POA: Diagnosis present

## 2011-07-20 DIAGNOSIS — I129 Hypertensive chronic kidney disease with stage 1 through stage 4 chronic kidney disease, or unspecified chronic kidney disease: Secondary | ICD-10-CM | POA: Diagnosis present

## 2011-07-20 DIAGNOSIS — B9689 Other specified bacterial agents as the cause of diseases classified elsewhere: Secondary | ICD-10-CM | POA: Diagnosis present

## 2011-07-20 DIAGNOSIS — T8740 Infection of amputation stump, unspecified extremity: Principal | ICD-10-CM | POA: Diagnosis present

## 2011-07-20 DIAGNOSIS — K219 Gastro-esophageal reflux disease without esophagitis: Secondary | ICD-10-CM | POA: Diagnosis present

## 2011-07-20 DIAGNOSIS — Y9389 Activity, other specified: Secondary | ICD-10-CM

## 2011-07-20 DIAGNOSIS — Z794 Long term (current) use of insulin: Secondary | ICD-10-CM

## 2011-07-20 DIAGNOSIS — F411 Generalized anxiety disorder: Secondary | ICD-10-CM | POA: Diagnosis present

## 2011-07-20 DIAGNOSIS — Z833 Family history of diabetes mellitus: Secondary | ICD-10-CM

## 2011-07-20 DIAGNOSIS — F3289 Other specified depressive episodes: Secondary | ICD-10-CM | POA: Diagnosis present

## 2011-07-20 DIAGNOSIS — E785 Hyperlipidemia, unspecified: Secondary | ICD-10-CM | POA: Diagnosis present

## 2011-07-20 DIAGNOSIS — Z6836 Body mass index (BMI) 36.0-36.9, adult: Secondary | ICD-10-CM

## 2011-07-20 DIAGNOSIS — M19079 Primary osteoarthritis, unspecified ankle and foot: Secondary | ICD-10-CM | POA: Diagnosis present

## 2011-07-20 DIAGNOSIS — L02419 Cutaneous abscess of limb, unspecified: Secondary | ICD-10-CM | POA: Diagnosis present

## 2011-07-20 DIAGNOSIS — N189 Chronic kidney disease, unspecified: Secondary | ICD-10-CM | POA: Diagnosis present

## 2011-07-20 DIAGNOSIS — G4730 Sleep apnea, unspecified: Secondary | ICD-10-CM | POA: Diagnosis present

## 2011-07-20 DIAGNOSIS — F329 Major depressive disorder, single episode, unspecified: Secondary | ICD-10-CM | POA: Diagnosis present

## 2011-07-20 DIAGNOSIS — L03119 Cellulitis of unspecified part of limb: Secondary | ICD-10-CM | POA: Diagnosis present

## 2011-07-20 DIAGNOSIS — S98139A Complete traumatic amputation of one unspecified lesser toe, initial encounter: Secondary | ICD-10-CM

## 2011-07-20 DIAGNOSIS — Z8614 Personal history of Methicillin resistant Staphylococcus aureus infection: Secondary | ICD-10-CM

## 2011-07-20 DIAGNOSIS — IMO0002 Reserved for concepts with insufficient information to code with codable children: Secondary | ICD-10-CM

## 2011-07-20 DIAGNOSIS — E039 Hypothyroidism, unspecified: Secondary | ICD-10-CM | POA: Diagnosis present

## 2011-07-20 DIAGNOSIS — S88119A Complete traumatic amputation at level between knee and ankle, unspecified lower leg, initial encounter: Secondary | ICD-10-CM

## 2011-07-20 DIAGNOSIS — W19XXXS Unspecified fall, sequela: Secondary | ICD-10-CM

## 2011-07-20 DIAGNOSIS — Z79899 Other long term (current) drug therapy: Secondary | ICD-10-CM

## 2011-07-20 DIAGNOSIS — Y999 Unspecified external cause status: Secondary | ICD-10-CM

## 2011-07-20 DIAGNOSIS — Z9641 Presence of insulin pump (external) (internal): Secondary | ICD-10-CM

## 2011-07-20 DIAGNOSIS — Y9229 Other specified public building as the place of occurrence of the external cause: Secondary | ICD-10-CM

## 2011-07-20 DIAGNOSIS — Z96649 Presence of unspecified artificial hip joint: Secondary | ICD-10-CM

## 2011-07-20 DIAGNOSIS — E11319 Type 2 diabetes mellitus with unspecified diabetic retinopathy without macular edema: Secondary | ICD-10-CM | POA: Diagnosis present

## 2011-07-20 HISTORY — PX: I&D EXTREMITY: SHX5045

## 2011-07-20 LAB — DIFFERENTIAL
Band Neutrophils: 0 % (ref 0–10)
Blasts: 0 %
Eosinophils Absolute: 0 10*3/uL (ref 0.0–0.7)
Eosinophils Relative: 0 % (ref 0–5)
Lymphocytes Relative: 33 % (ref 12–46)
Lymphs Abs: 1.7 10*3/uL (ref 0.7–4.0)
Metamyelocytes Relative: 0 %
Monocytes Absolute: 0.6 10*3/uL (ref 0.1–1.0)
Monocytes Relative: 11 % (ref 3–12)

## 2011-07-20 LAB — BASIC METABOLIC PANEL
BUN: 28 mg/dL — ABNORMAL HIGH (ref 6–23)
Calcium: 8.7 mg/dL (ref 8.4–10.5)
GFR calc Af Amer: 42 mL/min — ABNORMAL LOW (ref >90)
GFR calc non Af Amer: 37 mL/min — ABNORMAL LOW (ref >90)
Glucose, Bld: 182 mg/dL — ABNORMAL HIGH (ref 70–99)
Potassium: 4.1 mEq/L (ref 3.5–5.1)
Sodium: 135 mEq/L (ref 135–145)

## 2011-07-20 LAB — CBC
HCT: 27.4 % — ABNORMAL LOW (ref 36.0–46.0)
MCV: 69.4 fL — ABNORMAL LOW (ref 78.0–100.0)
RBC: 3.95 MIL/uL (ref 3.87–5.11)
RDW: 14.7 % (ref 11.5–15.5)
WBC: 5.2 10*3/uL (ref 4.0–10.5)

## 2011-07-20 LAB — GLUCOSE, CAPILLARY
Glucose-Capillary: 175 mg/dL — ABNORMAL HIGH (ref 70–99)
Glucose-Capillary: 193 mg/dL — ABNORMAL HIGH (ref 70–99)

## 2011-07-20 LAB — SURGICAL PCR SCREEN
MRSA, PCR: NEGATIVE
Staphylococcus aureus: NEGATIVE

## 2011-07-20 LAB — TYPE AND SCREEN

## 2011-07-20 SURGERY — IRRIGATION AND DEBRIDEMENT EXTREMITY
Anesthesia: General | Site: Leg Lower | Laterality: Right | Wound class: Dirty or Infected

## 2011-07-20 MED ORDER — CEFAZOLIN SODIUM 1-5 GM-% IV SOLN
INTRAVENOUS | Status: AC
  Filled 2011-07-20: qty 100

## 2011-07-20 MED ORDER — LACTATED RINGERS IV SOLN
INTRAVENOUS | Status: DC
  Administered 2011-07-20: 1000 mL via INTRAVENOUS

## 2011-07-20 MED ORDER — FERROUS SULFATE 325 (65 FE) MG PO TABS
325.0000 mg | ORAL_TABLET | Freq: Three times a day (TID) | ORAL | Status: DC
  Administered 2011-07-20 – 2011-07-24 (×11): 325 mg via ORAL
  Filled 2011-07-20 (×14): qty 1

## 2011-07-20 MED ORDER — FELODIPINE ER 5 MG PO TB24
5.0000 mg | ORAL_TABLET | Freq: Every day | ORAL | Status: DC
  Administered 2011-07-21 – 2011-07-24 (×4): 5 mg via ORAL
  Filled 2011-07-20 (×5): qty 1

## 2011-07-20 MED ORDER — HYDROCODONE-ACETAMINOPHEN 7.5-325 MG PO TABS
1.0000 | ORAL_TABLET | ORAL | Status: DC
  Administered 2011-07-20 – 2011-07-21 (×4): 2 via ORAL
  Filled 2011-07-20 (×4): qty 2
  Filled 2011-07-20: qty 1

## 2011-07-20 MED ORDER — FENTANYL CITRATE 0.05 MG/ML IJ SOLN
25.0000 ug | INTRAMUSCULAR | Status: DC | PRN
  Administered 2011-07-20: 50 ug via INTRAVENOUS

## 2011-07-20 MED ORDER — DIPHENHYDRAMINE HCL 25 MG PO CAPS: 25.0000 mg | ORAL_CAPSULE | Freq: Four times a day (QID) | ORAL | Status: DC | PRN

## 2011-07-20 MED ORDER — VANCOMYCIN HCL 1000 MG IV SOLR
2500.0000 mg | Freq: Once | INTRAVENOUS | Status: AC
  Administered 2011-07-20: 2500 mg via INTRAVENOUS
  Filled 2011-07-20: qty 2500

## 2011-07-20 MED ORDER — EPHEDRINE SULFATE 50 MG/ML IJ SOLN
INTRAMUSCULAR | Status: DC | PRN
  Administered 2011-07-20: 5 mg via INTRAVENOUS

## 2011-07-20 MED ORDER — ONDANSETRON HCL 4 MG/2ML IJ SOLN
INTRAMUSCULAR | Status: DC | PRN
  Administered 2011-07-20: 4 mg via INTRAVENOUS

## 2011-07-20 MED ORDER — FENTANYL CITRATE 0.05 MG/ML IJ SOLN
INTRAMUSCULAR | Status: DC | PRN
  Administered 2011-07-20 (×3): 50 ug via INTRAVENOUS

## 2011-07-20 MED ORDER — POLYETHYLENE GLYCOL 3350 17 G PO PACK
17.0000 g | PACK | Freq: Two times a day (BID) | ORAL | Status: DC
  Administered 2011-07-20 – 2011-07-24 (×8): 17 g via ORAL

## 2011-07-20 MED ORDER — MIDAZOLAM HCL 5 MG/5ML IJ SOLN
INTRAMUSCULAR | Status: DC | PRN
  Administered 2011-07-20: 2 mg via INTRAVENOUS

## 2011-07-20 MED ORDER — OLOPATADINE HCL 0.6 % NA SOLN
1.0000 [drp] | Freq: Every day | NASAL | Status: DC | PRN
  Filled 2011-07-20: qty 0.4

## 2011-07-20 MED ORDER — HYDROCHLOROTHIAZIDE 12.5 MG PO CAPS
12.5000 mg | ORAL_CAPSULE | Freq: Every day | ORAL | Status: DC
  Administered 2011-07-20 – 2011-07-23 (×4): 12.5 mg via ORAL
  Filled 2011-07-20 (×5): qty 1

## 2011-07-20 MED ORDER — LEVOTHYROXINE SODIUM 125 MCG PO TABS
250.0000 ug | ORAL_TABLET | Freq: Every day | ORAL | Status: DC
  Administered 2011-07-21 – 2011-07-24 (×4): 250 ug via ORAL
  Filled 2011-07-20 (×5): qty 2

## 2011-07-20 MED ORDER — FENTANYL CITRATE 0.05 MG/ML IJ SOLN
INTRAMUSCULAR | Status: AC
  Filled 2011-07-20: qty 2

## 2011-07-20 MED ORDER — RIVAROXABAN 10 MG PO TABS
10.0000 mg | ORAL_TABLET | ORAL | Status: DC
  Administered 2011-07-21 – 2011-07-24 (×4): 10 mg via ORAL
  Filled 2011-07-20 (×4): qty 1

## 2011-07-20 MED ORDER — ONDANSETRON HCL 4 MG/2ML IJ SOLN
4.0000 mg | Freq: Four times a day (QID) | INTRAMUSCULAR | Status: DC | PRN
  Administered 2011-07-22: 4 mg via INTRAVENOUS
  Filled 2011-07-20 (×2): qty 2

## 2011-07-20 MED ORDER — INSULIN REGULAR HUMAN 100 UNIT/ML IJ SOLN
4.1000 [IU] | Freq: Once | INTRAMUSCULAR | Status: AC
  Administered 2011-07-20: 4.1 [IU] via INTRAVENOUS

## 2011-07-20 MED ORDER — OLMESARTAN MEDOXOMIL-HCTZ 40-12.5 MG PO TABS: 1.0000 | ORAL_TABLET | Freq: Every day | ORAL | Status: DC

## 2011-07-20 MED ORDER — ESCITALOPRAM OXALATE 20 MG PO TABS
20.0000 mg | ORAL_TABLET | Freq: Every day | ORAL | Status: DC
  Administered 2011-07-21 – 2011-07-24 (×4): 20 mg via ORAL
  Filled 2011-07-20 (×5): qty 1

## 2011-07-20 MED ORDER — ONDANSETRON HCL 4 MG PO TABS: 4.0000 mg | ORAL_TABLET | Freq: Four times a day (QID) | ORAL | Status: DC | PRN

## 2011-07-20 MED ORDER — INSULIN ASPART 100 UNIT/ML ~~LOC~~ SOLN
0.0000 [IU] | Freq: Every day | SUBCUTANEOUS | Status: DC
  Administered 2011-07-21: 2 [IU] via SUBCUTANEOUS

## 2011-07-20 MED ORDER — FLUTICASONE PROPIONATE 50 MCG/ACT NA SUSP
1.0000 | Freq: Every day | NASAL | Status: DC
  Administered 2011-07-22 – 2011-07-24 (×3): 1 via NASAL
  Filled 2011-07-20: qty 16

## 2011-07-20 MED ORDER — CARVEDILOL 6.25 MG PO TABS
6.2500 mg | ORAL_TABLET | Freq: Two times a day (BID) | ORAL | Status: DC
  Administered 2011-07-20 – 2011-07-24 (×8): 6.25 mg via ORAL
  Filled 2011-07-20 (×10): qty 1

## 2011-07-20 MED ORDER — CHLORHEXIDINE GLUCONATE 4 % EX LIQD
60.0000 mL | Freq: Once | CUTANEOUS | Status: DC
  Filled 2011-07-20: qty 60

## 2011-07-20 MED ORDER — INSULIN ASPART 100 UNIT/ML ~~LOC~~ SOLN
0.0000 [IU] | Freq: Three times a day (TID) | SUBCUTANEOUS | Status: DC
  Administered 2011-07-21 (×2): 11.3 [IU] via SUBCUTANEOUS
  Administered 2011-07-21: 15 [IU] via SUBCUTANEOUS
  Administered 2011-07-22: 13.7 [IU] via SUBCUTANEOUS
  Administered 2011-07-22: 17.9 [IU] via SUBCUTANEOUS
  Administered 2011-07-22: 14.6 [IU] via SUBCUTANEOUS
  Administered 2011-07-24: 15 [IU] via SUBCUTANEOUS
  Administered 2011-07-24: 14.2 [IU] via SUBCUTANEOUS

## 2011-07-20 MED ORDER — PROMETHAZINE HCL 25 MG/ML IJ SOLN: 6.2500 mg | INTRAMUSCULAR | Status: DC | PRN

## 2011-07-20 MED ORDER — LORATADINE 10 MG PO TABS
10.0000 mg | ORAL_TABLET | Freq: Every day | ORAL | Status: DC
  Administered 2011-07-21 – 2011-07-24 (×4): 10 mg via ORAL
  Filled 2011-07-20 (×4): qty 1

## 2011-07-20 MED ORDER — SODIUM CHLORIDE 0.9 % IV SOLN
INTRAVENOUS | Status: DC
  Administered 2011-07-20 – 2011-07-22 (×3): via INTRAVENOUS
  Filled 2011-07-20 (×14): qty 1000

## 2011-07-20 MED ORDER — MUPIROCIN 2 % EX OINT
TOPICAL_OINTMENT | CUTANEOUS | Status: AC
  Filled 2011-07-20: qty 22

## 2011-07-20 MED ORDER — PHENOL 1.4 % MT LIQD
1.0000 | OROMUCOSAL | Status: DC | PRN
  Filled 2011-07-20: qty 177

## 2011-07-20 MED ORDER — MENTHOL 3 MG MT LOZG
1.0000 | LOZENGE | OROMUCOSAL | Status: DC | PRN
  Filled 2011-07-20: qty 9

## 2011-07-20 MED ORDER — BISACODYL 5 MG PO TBEC: 5.0000 mg | DELAYED_RELEASE_TABLET | Freq: Every day | ORAL | Status: DC | PRN

## 2011-07-20 MED ORDER — METHOCARBAMOL 500 MG PO TABS
500.0000 mg | ORAL_TABLET | Freq: Four times a day (QID) | ORAL | Status: DC | PRN
  Administered 2011-07-22: 500 mg via ORAL
  Filled 2011-07-20 (×2): qty 1

## 2011-07-20 MED ORDER — METOCLOPRAMIDE HCL 5 MG/ML IJ SOLN: 5.0000 mg | Freq: Three times a day (TID) | INTRAMUSCULAR | Status: DC | PRN

## 2011-07-20 MED ORDER — PANTOPRAZOLE SODIUM 20 MG PO TBEC
20.0000 mg | DELAYED_RELEASE_TABLET | Freq: Every day | ORAL | Status: DC
  Administered 2011-07-23: 20 mg via ORAL
  Filled 2011-07-20 (×5): qty 1

## 2011-07-20 MED ORDER — ATORVASTATIN CALCIUM 40 MG PO TABS
40.0000 mg | ORAL_TABLET | Freq: Every day | ORAL | Status: DC
  Administered 2011-07-20 – 2011-07-23 (×4): 40 mg via ORAL
  Filled 2011-07-20 (×5): qty 1

## 2011-07-20 MED ORDER — INSULIN PUMP
1.0000 | SUBCUTANEOUS | Status: DC
  Administered 2011-07-21 – 2011-07-23 (×4): 1 via SUBCUTANEOUS

## 2011-07-20 MED ORDER — METOCLOPRAMIDE HCL 10 MG PO TABS: 5.0000 mg | ORAL_TABLET | Freq: Three times a day (TID) | ORAL | Status: DC | PRN

## 2011-07-20 MED ORDER — METHOCARBAMOL 100 MG/ML IJ SOLN
500.0000 mg | Freq: Four times a day (QID) | INTRAMUSCULAR | Status: DC | PRN
  Filled 2011-07-20: qty 5

## 2011-07-20 MED ORDER — CEFAZOLIN SODIUM-DEXTROSE 2-3 GM-% IV SOLR
2.0000 g | INTRAVENOUS | Status: AC
  Administered 2011-07-20: 2 g via INTRAVENOUS

## 2011-07-20 MED ORDER — PROPOFOL 10 MG/ML IV EMUL
INTRAVENOUS | Status: DC | PRN
  Administered 2011-07-20: 250 mg via INTRAVENOUS

## 2011-07-20 MED ORDER — CEFAZOLIN SODIUM-DEXTROSE 2-3 GM-% IV SOLR
2.0000 g | Freq: Four times a day (QID) | INTRAVENOUS | Status: AC
  Administered 2011-07-20 – 2011-07-21 (×2): 2 g via INTRAVENOUS
  Filled 2011-07-20 (×2): qty 50

## 2011-07-20 MED ORDER — SODIUM CHLORIDE 0.9 % IR SOLN
Status: DC | PRN
  Administered 2011-07-20: 3000 mL

## 2011-07-20 MED ORDER — HYDROMORPHONE HCL PF 1 MG/ML IJ SOLN: 0.5000 mg | INTRAMUSCULAR | Status: DC | PRN

## 2011-07-20 MED ORDER — FLEET ENEMA 7-19 GM/118ML RE ENEM: 1.0000 | ENEMA | Freq: Once | RECTAL | Status: AC | PRN

## 2011-07-20 MED ORDER — LIDOCAINE HCL (CARDIAC) 20 MG/ML IV SOLN
INTRAVENOUS | Status: DC | PRN
  Administered 2011-07-20: 80 mg via INTRAVENOUS

## 2011-07-20 MED ORDER — DOCUSATE SODIUM 100 MG PO CAPS
100.0000 mg | ORAL_CAPSULE | Freq: Two times a day (BID) | ORAL | Status: DC
  Administered 2011-07-20 – 2011-07-24 (×8): 100 mg via ORAL

## 2011-07-20 MED ORDER — LACTATED RINGERS IV SOLN: INTRAVENOUS | Status: DC

## 2011-07-20 MED ORDER — VANCOMYCIN HCL 1000 MG IV SOLR
1750.0000 mg | INTRAVENOUS | Status: DC
  Administered 2011-07-21: 1750 mg via INTRAVENOUS
  Filled 2011-07-20 (×2): qty 1750

## 2011-07-20 MED ORDER — ALUM & MAG HYDROXIDE-SIMETH 200-200-20 MG/5ML PO SUSP: 30.0000 mL | ORAL | Status: DC | PRN

## 2011-07-20 MED ORDER — ZOLPIDEM TARTRATE 5 MG PO TABS: 5.0000 mg | ORAL_TABLET | Freq: Every evening | ORAL | Status: DC | PRN

## 2011-07-20 MED ORDER — OLMESARTAN MEDOXOMIL 40 MG PO TABS
40.0000 mg | ORAL_TABLET | Freq: Every day | ORAL | Status: DC
  Administered 2011-07-20 – 2011-07-23 (×4): 40 mg via ORAL
  Filled 2011-07-20 (×5): qty 1

## 2011-07-20 SURGICAL SUPPLY — 40 items
BAG ZIPLOCK 12X15 (MISCELLANEOUS) ×2 IMPLANT
BANDAGE ELASTIC 6 VELCRO ST LF (GAUZE/BANDAGES/DRESSINGS) ×2 IMPLANT
BANDAGE ESMARK 6X9 LF (GAUZE/BANDAGES/DRESSINGS) ×1 IMPLANT
BANDAGE GAUZE ELAST BULKY 4 IN (GAUZE/BANDAGES/DRESSINGS) ×4 IMPLANT
BNDG ESMARK 6X9 LF (GAUZE/BANDAGES/DRESSINGS) ×2
CLOTH BEACON ORANGE TIMEOUT ST (SAFETY) ×2 IMPLANT
CUFF TOURN SGL QUICK 18 (TOURNIQUET CUFF) IMPLANT
CUFF TOURN SGL QUICK 24 (TOURNIQUET CUFF)
CUFF TOURN SGL QUICK 34 (TOURNIQUET CUFF)
CUFF TOURN SGL QUICK 44 (TOURNIQUET CUFF) ×2 IMPLANT
CUFF TRNQT CYL 24X4X40X1 (TOURNIQUET CUFF) IMPLANT
CUFF TRNQT CYL 34X4X40X1 (TOURNIQUET CUFF) IMPLANT
DRAIN PENROSE 18X1/2 LTX STRL (DRAIN) ×2 IMPLANT
DRSG PAD ABDOMINAL 8X10 ST (GAUZE/BANDAGES/DRESSINGS) ×8 IMPLANT
DURAPREP 26ML APPLICATOR (WOUND CARE) ×2 IMPLANT
ELECT REM PT RETURN 9FT ADLT (ELECTROSURGICAL) ×2
ELECTRODE REM PT RTRN 9FT ADLT (ELECTROSURGICAL) ×1 IMPLANT
GAUZE PACKING IODOFORM 1 (PACKING) ×2 IMPLANT
GAUZE XEROFORM 5X9 LF (GAUZE/BANDAGES/DRESSINGS) ×2 IMPLANT
GLOVE BIOGEL PI IND STRL 7.5 (GLOVE) ×1 IMPLANT
GLOVE BIOGEL PI IND STRL 8 (GLOVE) ×1 IMPLANT
GLOVE BIOGEL PI INDICATOR 7.5 (GLOVE) ×1
GLOVE BIOGEL PI INDICATOR 8 (GLOVE) ×1
GLOVE ECLIPSE 8.0 STRL XLNG CF (GLOVE) IMPLANT
GLOVE ORTHO TXT STRL SZ7.5 (GLOVE) ×4 IMPLANT
GLOVE SURG ORTHO 8.0 STRL STRW (GLOVE) ×2 IMPLANT
GOWN BRE IMP PREV XXLGXLNG (GOWN DISPOSABLE) ×4 IMPLANT
GOWN STRL NON-REIN LRG LVL3 (GOWN DISPOSABLE) ×2 IMPLANT
HANDPIECE INTERPULSE COAX TIP (DISPOSABLE) ×1
KIT BASIN OR (CUSTOM PROCEDURE TRAY) ×2 IMPLANT
MANIFOLD NEPTUNE II (INSTRUMENTS) ×2 IMPLANT
PACK LOWER EXTREMITY WL (CUSTOM PROCEDURE TRAY) ×2 IMPLANT
PAD CAST 4YDX4 CTTN HI CHSV (CAST SUPPLIES) ×1 IMPLANT
PADDING CAST COTTON 4X4 STRL (CAST SUPPLIES) ×1
POSITIONER SURGICAL ARM (MISCELLANEOUS) ×2 IMPLANT
SET HNDPC FAN SPRY TIP SCT (DISPOSABLE) ×1 IMPLANT
SOL PREP PROV IODINE SCRUB 4OZ (MISCELLANEOUS) ×2 IMPLANT
SPONGE GAUZE 4X4 12PLY (GAUZE/BANDAGES/DRESSINGS) ×8 IMPLANT
SYR CONTROL 10ML LL (SYRINGE) ×2 IMPLANT
TOWEL OR 17X26 10 PK STRL BLUE (TOWEL DISPOSABLE) ×4 IMPLANT

## 2011-07-20 NOTE — Transfer of Care (Signed)
Immediate Anesthesia Transfer of Care Note  Patient: [name]  Procedure(s) Performed: Procedure(s) (LRB): IRRIGATION AND DEBRIDEMENT EXTREMITY (Right)  Patient Location: PACU  Anesthesia Type: General  Level of Consciousness: awake, alert , oriented and patient cooperative  Airway & Oxygen Therapy: Patient Spontanous Breathing and Patient connected to face mask oxygen  Post-op Assessment: Report given to PACU RN, Post -op Vital signs reviewed and stable and Patient moving all extremities  Post vital signs: Reviewed and stable  Complications: No apparent anesthesia complications

## 2011-07-20 NOTE — Brief Op Note (Signed)
07/20/2011  [facility]  MRN: [id number] CSN:  [id number]   5:15 PM  PATIENT:  [name]  46 y.o. female  PRE-OPERATIVE DIAGNOSIS:  status post op I&D right leg abscess with recurrent hematoma in right leg  POST-OPERATIVE DIAGNOSIS:  status post op I&D right leg abscess with recurrent hematoma, recurrent infection in right leg  PROCEDURE:  Procedure(s) (LRB):  Repeat Incisional debridement right leg abcess IRRIGATION AND DEBRIDEMENT EXTREMITY (Right)  SURGEON:  Surgeon(s) and Role:    * Shelda Pal, MD - Primary  PHYSICIAN ASSISTANT: Lanney Gins, PA-C   ANESTHESIA:   general  EBL:   minimal  BLOOD ADMINISTERED:none  DRAINS: none   LOCAL MEDICATIONS USED:  NONE  SPECIMEN:  Source of Specimen:  right leg stump abcess  DISPOSITION OF SPECIMEN:  PATHOLOGY  COUNTS:  YES  TOURNIQUET:   Total Tourniquet Time Documented: Thigh (Right) - 25 minutes  DICTATION: .Other Dictation: Dictation Number [id number]  PLAN OF CARE: Admit to inpatient   PATIENT DISPOSITION:  PACU - hemodynamically stable.   Delay start of Pharmacological VTE agent (>24hrs) due to surgical blood loss or risk of bleeding: no

## 2011-07-20 NOTE — Progress Notes (Signed)
ANTIBIOTIC CONSULT NOTE - INITIAL  Pharmacy Consult for Vancomycin Indication: Recurrent abscess of R stump  Allergies  Allergen Reactions  . Fish Allergy Anaphylaxis and Itching  . Travatan (Travoprost) Itching   Patient Measurements: Height: 6\' 4"  (193 cm) Weight: 303 lb (137.44 kg) IBW/kg (Calculated) : 82.3   Vital Signs: Temp: 98.1 F (36.7 C) (05/20 1851) Temp src: Oral (05/20 1851) BP: 143/85 mmHg (05/20 1851) Pulse Rate: 80  (05/20 1851) Intake/Output from previous day:   Intake/Output from this shift:    Labs:  Basename 07/20/11 1310  WBC 5.2  HGB 8.6*  PLT 200  LABCREA --  CREATININE 1.62*   Estimated Creatinine Clearance: 69.9 ml/min (by C-G formula based on Cr of 1.62). No results found for this basename: VANCOTROUGH:2,VANCOPEAK:2,VANCORANDOM:2,GENTTROUGH:2,GENTPEAK:2,GENTRANDOM:2,TOBRATROUGH:2,TOBRAPEAK:2,TOBRARND:2,AMIKACINPEAK:2,AMIKACINTROU:2,AMIKACIN:2, in the last 72 hours   Medical History: Past Medical History  Diagnosis Date  . Hypertension   . Thyroid disease   . Hypothyroidism   . Diabetic retinopathy   . Thalassemia minor   . Charcot ankle     AND FOOT - LEFT  . Dysrhythmia     PAST HX SVT--HAD HEART ABLATION 2002--NO PROBLEMS SINCE  . Osteomyelitis of foot 1993    PT REQUIRED BELOW THE KNEE AMPUTATION   . Diabetes mellitus     PT HAS INSULIN PUMP-DR. ALTHEIMER MANAGES PT'S PUMP  . Blood transfusion   . Chronic kidney disease     RENAL INSUFFICIENCY  . GERD (gastroesophageal reflux disease)     PT WOULD TAKE PROTONIX IF HAVING GERD  . Headache     2010--DX WITH CLUSTER H/A'S -JUST OCCAS BAD HEADACHE NOW  . 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 Depression   . Anxiety   . Sleep apnea     PT STATES SLEEP STUDY ABOUT 13 YRS AGO-TOLD SHE HAD SLEEP APNEA BUT NEVER GIVEN CPAP   Assessment:  7 YOF with h/o R BKA ~20 years ago.  In Feb, 2013, pt trip/fall and landed on R knee  (prosthetic leg).  Patient is s/p I&D 4/23/201 but with ongoing drainage from incision site, recurrent hematoma.  Patient is now s/p repeat I&D (5/20) to begin vancomycin.  Patient has order for Ancef x 2 doses post-op and MD would like to give Vancomycin with Ancef.  ID has been consulted to evaluate course of treatment.  Scr 1.62, Wt = 137.4 kg.  CrCl 70 ml/min, normalized 57 ml/min.  Of note, 4/23 abscess culture was negative. Pending 5/20 abscess culture  Goal of Therapy:  Vancomycin trough level 15-20 mcg/ml  Plan:   Vancomycin 2500 mg IV x 1 as loading dose   Then 1750 mg IV q24h  Pharmacy will f/u daily  Geoffry Paradise Thi 07/20/2011,7:16 PM

## 2011-07-20 NOTE — Preoperative (Signed)
Beta Blockers   Reason not to administer Beta Blockers:Coreg taken 07-20-11 at 0800

## 2011-07-20 NOTE — Anesthesia Preprocedure Evaluation (Addendum)
Anesthesia Evaluation  Patient identified by MRN, date of birth, ID band Patient awake    Reviewed: Allergy & Precautions, H&P , NPO status , Patient's Chart, lab work & pertinent test results  History of Anesthesia Complications Negative for: history of anesthetic complications  Airway Mallampati: II TM Distance: >3 FB Neck ROM: Full    Dental  (+) Teeth Intact, Caps and Dental Advisory Given,    Pulmonary sleep apnea ,  breath sounds clear to auscultation  Pulmonary exam normal       Cardiovascular hypertension, Pt. on medications + dysrhythmias Atrial Fibrillation Rhythm:Regular Rate:Normal     Neuro/Psych  Headaches, PSYCHIATRIC DISORDERS Anxiety Depression    GI/Hepatic Neg liver ROS, GERD-  Medicated,  Endo/Other  Diabetes mellitus-, Poorly Controlled, Type 2, Insulin DependentHypothyroidism Morbid obesity  Renal/GU negative Renal ROS  negative genitourinary   Musculoskeletal negative musculoskeletal ROS (+)   Abdominal   Peds  Hematology negative hematology ROS (+)   Anesthesia Other Findings   Reproductive/Obstetrics negative OB ROS                           Anesthesia Physical Anesthesia Plan  ASA: III  Anesthesia Plan: General   Post-op Pain Management:    Induction: Intravenous  Airway Management Planned: LMA  Additional Equipment:   Intra-op Plan:   Post-operative Plan: Extubation in OR  Informed Consent: I have reviewed the patients History and Physical, chart, labs and discussed the procedure including the risks, benefits and alternatives for the proposed anesthesia with the patient or authorized representative who has indicated his/her understanding and acceptance.   Dental advisory given  Plan Discussed with: CRNA  Anesthesia Plan Comments:         Anesthesia Quick Evaluation

## 2011-07-20 NOTE — Interval H&P Note (Signed)
History and Physical Interval Note:  07/20/2011 3:42 PM  [name]  has presented today for surgery, with the diagnosis of status post op I&D right leg abscess with recurrent hematoma in right leg  The various methods of treatment have been discussed with the patient and family. After consideration of risks, benefits and other options for treatment, the patient has consented to  Procedure(s) (LRB): Evacuation of right leg hematoma,  IRRIGATION AND DEBRIDEMENT EXTREMITY (Right) as a surgical intervention .  The patients' history has been reviewed, patient examined, no change in status, stable for surgery.  I have reviewed the patients' chart and labs.  Questions were answered to the patient's satisfaction.     Shelda Pal

## 2011-07-20 NOTE — Anesthesia Postprocedure Evaluation (Signed)
Anesthesia Post Note  Patient: [name]  Procedure(s) Performed: Procedure(s) (LRB): IRRIGATION AND DEBRIDEMENT EXTREMITY (Right)  Anesthesia type: General  Patient location: PACU  Post pain: Pain level controlled  Post assessment: Post-op Vital signs reviewed  Last Vitals:  Filed Vitals:   07/20/11 1730  BP: 150/80  Pulse: 82  Temp:   Resp: 20    Post vital signs: Reviewed  Level of consciousness: sedated  Complications: No apparent anesthesia complications

## 2011-07-21 DIAGNOSIS — L02419 Cutaneous abscess of limb, unspecified: Secondary | ICD-10-CM

## 2011-07-21 DIAGNOSIS — L03119 Cellulitis of unspecified part of limb: Secondary | ICD-10-CM

## 2011-07-21 LAB — BASIC METABOLIC PANEL
BUN: 21 mg/dL (ref 6–23)
Chloride: 101 mEq/L (ref 96–112)
Creatinine, Ser: 1.39 mg/dL — ABNORMAL HIGH (ref 0.50–1.10)
GFR calc Af Amer: 51 mL/min — ABNORMAL LOW (ref >90)
GFR calc non Af Amer: 44 mL/min — ABNORMAL LOW (ref >90)
Potassium: 4.1 mEq/L (ref 3.5–5.1)

## 2011-07-21 LAB — CBC
MCHC: 30.4 g/dL (ref 30.0–36.0)
Platelets: 256 10*3/uL (ref 150–400)
RDW: 14.9 % (ref 11.5–15.5)
WBC: 7.2 10*3/uL (ref 4.0–10.5)

## 2011-07-21 LAB — GLUCOSE, CAPILLARY
Glucose-Capillary: 117 mg/dL — ABNORMAL HIGH (ref 70–99)
Glucose-Capillary: 119 mg/dL — ABNORMAL HIGH (ref 70–99)

## 2011-07-21 MED ORDER — TRAMADOL HCL 50 MG PO TABS
50.0000 mg | ORAL_TABLET | Freq: Four times a day (QID) | ORAL | Status: DC | PRN
  Administered 2011-07-21 – 2011-07-22 (×3): 50 mg via ORAL
  Filled 2011-07-21 (×3): qty 1

## 2011-07-21 MED ORDER — PIPERACILLIN-TAZOBACTAM 3.375 G IVPB
3.3750 g | Freq: Three times a day (TID) | INTRAVENOUS | Status: DC
  Administered 2011-07-21 – 2011-07-23 (×5): 3.375 g via INTRAVENOUS
  Filled 2011-07-21 (×7): qty 50

## 2011-07-21 NOTE — Progress Notes (Signed)
Inpatient Diabetes Program  Pt is a 46 y.o. female complaining persistent drainage from incision from the I&D of the right leg abscess on 06/23/2011.  Has insulin pump and sees Dr. Leslie Dales for diabetes management.States she has been under stress lately with the loss of her job at [employer].  HgbA1C last week was 9.4%.  Brought pump supplies and plans to change site this afternoon.     Results for FREDI, HURTADO (MRN [id number]) as of 07/21/2011 10:34  Ref. Range 07/20/2011 17:23 07/20/2011 19:32 07/20/2011 22:08 07/21/2011 07:17  Glucose-Capillary Latest Range: 70-99 mg/dL 147 (H) 829 (H) 562 (H) 117 (H)  Results for JALIN, ERPELDING (MRN [id number]) as of 07/21/2011 10:34  Ref. Range 07/20/2011 13:10 07/21/2011 05:35  Glucose Latest Range: 70-99 mg/dL 696 (H) 86   Continue with pt managing insulin pump.  Spoke with RN re: documentation of bolus insulin.  Please D/C insulin correction orders (Novolog mod tidwc and hs) as pt is managing blood sugars with pump.  Will continue to follow.

## 2011-07-21 NOTE — Consult Note (Signed)
Infectious Diseases Initial Consultation  Reason for Consultation:  Lower extremity wound   HPI: [name] is a 46 y.o. 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 She sustained a ground level fall on valentine's day, where her prosthetic leg was caught on a floor matt and she fell onto her right knee. She again fell within a week's time. She started to notice pain and swelling at the end of her stump. She felt that it had increased in size, warm to touch and would have intermittent chills at home. She underwent  incision from the I&D of the right leg abscess on 06/23/2011, thought to be related to hematoma. She was being cared for at wound clinic by Dr. Kelly Splinter. The patient felt that her leg was still warm to touch, increasing in size and having chills. Dr. Kelly Splinter had removed a suture from her surgery and expressed a large clot. The patient was placed on cefazolin for roughly 3 wks, however, cutlures have been negative. She had seen Dr. Charlann Boxer last week who felt she would need a repeat I x D. On the day of admit, the patient felt increasingly worse, with a low grade fever, malaise and felt like she had lymph nodes palpable concerning for infection. Thus, she was admitted for I X D and exploration and abtx therapy.   Past Medical History  Diagnosis Date  . Hypertension   . Thyroid disease   . Hypothyroidism   . Diabetic retinopathy   . Thalassemia minor   . Charcot ankle     AND FOOT - LEFT  . Dysrhythmia     PAST HX SVT--HAD HEART ABLATION 2002--NO PROBLEMS SINCE  . Osteomyelitis of foot 1993    PT REQUIRED BELOW THE KNEE AMPUTATION   . Diabetes mellitus     PT HAS INSULIN PUMP-DR. ALTHEIMER MANAGES PT'S PUMP  . Blood transfusion   . Chronic kidney disease     RENAL INSUFFICIENCY  . GERD (gastroesophageal reflux disease)     PT WOULD TAKE PROTONIX IF HAVING GERD  .  Headache     2010--DX WITH CLUSTER H/A'S -JUST OCCAS BAD HEADACHE NOW  . 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 Depression   . Anxiety   . Sleep apnea     PT STATES SLEEP STUDY ABOUT 13 YRS AGO-TOLD SHE HAD SLEEP APNEA BUT NEVER GIVEN CPAP  - left foot TMA - hx of MRSA infection - hx of xanthamonas infection - hx of diabetic foot infection - s/p right carpal tunnel release = s/p catarct surgery bilateral - s/p retinal detachment repair - s/p hashimotos thyroiditis requiring surgery - s/p total hip replacement Left - s/p breast lumpectomy -benign  Allergies:  Allergies  Allergen Reactions  . Fish Allergy Anaphylaxis and Itching  . Travatan (Travoprost) Itching    Current antibiotics: vanco #2 Cefazolin #2 (however has been on IV home therapy for 3 wks)  MEDICATIONS:    . atorvastatin  40 mg Oral QHS  . carvedilol  6.25 mg Oral BID WC  .  ceFAZolin (ANCEF) IV  2 g Intravenous 60 min Pre-Op  .  ceFAZolin (ANCEF) IV  2 g Intravenous Q6H  . docusate sodium  100 mg Oral BID  . escitalopram  20 mg Oral QPC breakfast  . felodipine  5 mg Oral QPC breakfast  . fentaNYL      . ferrous sulfate  325 mg Oral TID PC  . fluticasone  1 spray Each Nare Daily  . olmesartan  40 mg Oral QHS   And  . hydrochlorothiazide  12.5 mg Oral QHS  . HYDROcodone-acetaminophen  1-2 tablet Oral Q4H  . insulin aspart  0-15 Units Subcutaneous TID WC  . insulin aspart  0-5 Units Subcutaneous QHS  . insulin pump  1 each Subcutaneous See admin instructions  . insulin regular  4.1 Units Intravenous Once  . levothyroxine  250 mcg Oral QAC breakfast  . loratadine  10 mg Oral Daily  . mupirocin ointment      . pantoprazole  20 mg Oral Q breakfast  . polyethylene glycol  17 g Oral BID  . rivaroxaban  10 mg Oral Q24H  . vancomycin  1,750 mg Intravenous Q24H  . vancomycin  2,500 mg Intravenous Once  . DISCONTD: chlorhexidine  60 mL Topical Once  .  DISCONTD: olmesartan-hydrochlorothiazide  1 tablet Oral QHS    History  Substance Use Topics  . Smoking status: Never Smoker   . Smokeless tobacco: Never Used  . Alcohol Use: Yes     RARELY - GLASS OF WINE  - has master's in nursing. 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 From [place], [place]. Previously worked in [place], and now in [place]. No smoking and rare alcohol. No pets, no recent travel. Not married.  Fh: diabetes, heart disease  Review of Systems  Constitutional: positive for fever, chills, diaphoresis, no activity change, appetite change, fatigue and unexpected weight change.  HENT: Negative for congestion, sore throat, rhinorrhea, sneezing, trouble swallowing and sinus pressure.  Eyes: Negative for photophobia and visual disturbance.  Respiratory: Negative for cough, chest tightness, shortness of breath, wheezing and stridor.  Cardiovascular: Negative for chest pain, palpitations and leg swelling.  Gastrointestinal: Negative for nausea, vomiting, abdominal pain, diarrhea, constipation, blood in stool, abdominal distention and anal bleeding.  Genitourinary: Negative for dysuria, hematuria, flank pain and difficulty urinating.  Musculper hpi Neurological: Negative for dizziness, tremors, weakness and light-headedness.  Hematological: Negative for adenopathy. Does not bruise/bleed easily.  Psychiatric/Behavioral: Negative for behavioral problems, confusion, sleep disturbance, dysphoric mood, decreased concentration and agitation.    OBJECTIVE: Temp:  [97.9 F (36.6 C)-99.8 F (37.7 C)] 99.2 F (37.3 C) (05/21 1339) Pulse Rate:  [76-92] 90  (05/21 1339) Resp:  [15-20] 16  (05/21 1339) BP: (117-164)/(51-127) 146/77 mmHg (05/21 1339) SpO2:  [94 %-100 %] 95 % (05/21 1339) Weight:  [303 lb (137.44 kg)] 303 lb (137.44 kg) (05/20 1851) BP 146/77  Pulse 90  Temp(Src) 99.2 F (37.3 C)  (Oral)  Resp 16  Ht 6\' 4"  (1.93 m)  Wt 303 lb (137.44 kg)  BMI 36.88 kg/m2  SpO2 95%  General Appearance:    Alert, cooperative, no distress, appears stated age  Head:    Normocephalic, without obvious abnormality, atraumatic  Eyes:    PERRL, conjunctiva/corneas clear, EOM's intact,     Nose:   Nares normal, septum midline, mucosa normal, no drainage    or sinus tenderness  Throat:   Lips, mucosa, and tongue normal; teeth and gums normal  Neck:   Supple, symmetrical, trachea midline, no adenopathy;    Surgical scar from prior thyroid surgery  Back:     Symmetric, no curvature, ROM normal, no  CVA tenderness  Lungs:     Clear to auscultation bilaterally, respirations unlabored      Heart:    Regular rate and rhythm, S1 and S2 normal, no murmur, rub   or gallop     Abdomen:     Soft, non-tender, bowel sounds active all four quadrants,    no masses, no organomegaly        Extremities:   Extremities normal, atraumatic, no cyanosis or edema  Pulses:   2+ and symmetric all extremities  Skin:   Multiple hyperpigmented healed skin lesions for fore arms and legs  Lymph nodes:   Cervical, supraclavicular, and axillary nodes normal  Neurologic:   CNII-XII intact, normal strength, sensation and reflexes    throughout   LABS: Results for orders placed during the hospital encounter of 07/20/11 (from the past 48 hour(s))  CBC     Status: Abnormal   Collection Time   07/20/11  1:10 PM      Component Value Range Comment   WBC 5.2  4.0 - 10.5 (K/uL)    RBC 3.95  3.87 - 5.11 (MIL/uL)    Hemoglobin 8.6 (*) 12.0 - 15.0 (g/dL)    HCT 91.4 (*) 78.2 - 46.0 (%)    MCV 69.4 (*) 78.0 - 100.0 (fL)    MCH 21.8 (*) 26.0 - 34.0 (pg)    MCHC 31.4  30.0 - 36.0 (g/dL)    RDW 95.6  21.3 - 08.6 (%)    Platelets 200  150 - 400 (K/uL)   DIFFERENTIAL     Status: Normal   Collection Time   07/20/11  1:10 PM      Component Value Range Comment   Neutrophils Relative 57  43 - 77 (%) CORRECTED ON 05/20 AT 1643:  PREVIOUSLY REPORTED AS 0   Lymphocytes Relative 33  12 - 46 (%) CORRECTED ON 05/20 AT 1643: PREVIOUSLY REPORTED AS 0   Monocytes Relative 11  3 - 12 (%) CORRECTED ON 05/20 AT 1643: PREVIOUSLY REPORTED AS 0   Eosinophils Relative 0  0 - 5 (%)    Basophils Relative 0  0 - 1 (%)    Band Neutrophils 0  0 - 10 (%)    Metamyelocytes Relative 0      Myelocytes 0      Promyelocytes Absolute 0      Blasts 0      Smear Review MORPHOLOGY UNREMARKABLE      Neutro Abs 3.0  1.7 - 7.7 (K/uL) CORRECTED ON 05/20 AT 1643: PREVIOUSLY REPORTED AS 0.0   Lymphs Abs 1.7  0.7 - 4.0 (K/uL) CORRECTED ON 05/20 AT 1643: PREVIOUSLY REPORTED AS 0.0   Monocytes Absolute 0.6  0.1 - 1.0 (K/uL) CORRECTED ON 05/20 AT 1643: PREVIOUSLY REPORTED AS 0.0   Eosinophils Absolute 0.0  0.0 - 0.7 (K/uL)    Basophils Absolute 0.0  0.0 - 0.1 (K/uL)   BASIC METABOLIC PANEL     Status: Abnormal   Collection Time   07/20/11  1:10 PM      Component Value Range Comment   Sodium 135  135 - 145 (mEq/L)    Potassium 4.1  3.5 - 5.1 (mEq/L)    Chloride 103  96 - 112 (mEq/L)    CO2 20  19 - 32 (mEq/L)    Glucose, Bld 182 (*) 70 - 99 (mg/dL)    BUN 28 (*) 6 - 23 (mg/dL)    Creatinine, Ser 5.78 (*) 0.50 - 1.10 (mg/dL)  Calcium 8.7  8.4 - 10.5 (mg/dL)    GFR calc non Af Amer 37 (*) >90 (mL/min)    GFR calc Af Amer 42 (*) >90 (mL/min)   PROTIME-INR     Status: Normal   Collection Time   07/20/11  1:10 PM      Component Value Range Comment   Prothrombin Time 13.5  11.6 - 15.2 (seconds)    INR 1.01  0.00 - 1.49    APTT     Status: Normal   Collection Time   07/20/11  1:10 PM      Component Value Range Comment   aPTT 24  24 - 37 (seconds)   TYPE AND SCREEN     Status: Normal   Collection Time   07/20/11  1:10 PM      Component Value Range Comment   ABO/RH(D) O POS      Antibody Screen NEG      Sample Expiration 07/23/2011     GLUCOSE, CAPILLARY     Status: Abnormal   Collection Time   07/20/11  1:19 PM      Component Value Range  Comment   Glucose-Capillary 175 (*) 70 - 99 (mg/dL)    Comment 1 Documented in Chart     SURGICAL PCR SCREEN     Status: Normal   Collection Time   07/20/11  1:45 PM      Component Value Range Comment   MRSA, PCR NEGATIVE  NEGATIVE     Staphylococcus aureus NEGATIVE  NEGATIVE    CULTURE, ROUTINE-ABSCESS     Status: Normal (Preliminary result)   Collection Time   07/20/11  5:05 PM      Component Value Range Comment   Specimen Description LEG RIGHT      Special Requests NONE      Gram Stain        Value: RARE WBC PRESENT,BOTH PMN AND MONONUCLEAR     RARE SQUAMOUS EPITHELIAL CELLS PRESENT     NO ORGANISMS SEEN   Culture NO GROWTH      Report Status PENDING     ANAEROBIC CULTURE     Status: Normal (Preliminary result)   Collection Time   07/20/11  5:05 PM      Component Value Range Comment   Specimen Description LEG RIGHT      Special Requests NONE      Gram Stain        Value: RARE WBC PRESENT,BOTH PMN AND MONONUCLEAR     RARE SQUAMOUS EPITHELIAL CELLS PRESENT     NO ORGANISMS SEEN   Culture        Value: NO ANAEROBES ISOLATED; CULTURE IN PROGRESS FOR 5 DAYS   Report Status PENDING     GLUCOSE, CAPILLARY     Status: Abnormal   Collection Time   07/20/11  5:23 PM      Component Value Range Comment   Glucose-Capillary 193 (*) 70 - 99 (mg/dL)    Comment 1 Documented in Chart      Comment 2 Notify RN     GLUCOSE, CAPILLARY     Status: Abnormal   Collection Time   07/20/11  7:32 PM      Component Value Range Comment   Glucose-Capillary 140 (*) 70 - 99 (mg/dL)   GLUCOSE, CAPILLARY     Status: Abnormal   Collection Time   07/20/11 10:08 PM      Component Value Range Comment   Glucose-Capillary 193 (*) 70 -  99 (mg/dL)   CBC     Status: Abnormal   Collection Time   07/21/11  5:35 AM      Component Value Range Comment   WBC 7.2  4.0 - 10.5 (K/uL)    RBC 4.11  3.87 - 5.11 (MIL/uL)    Hemoglobin 8.8 (*) 12.0 - 15.0 (g/dL)    HCT 09.8 (*) 11.9 - 46.0 (%)    MCV 70.3 (*) 78.0 -  100.0 (fL)    MCH 21.4 (*) 26.0 - 34.0 (pg)    MCHC 30.4  30.0 - 36.0 (g/dL)    RDW 14.7  82.9 - 56.2 (%)    Platelets 256  150 - 400 (K/uL)   BASIC METABOLIC PANEL     Status: Abnormal   Collection Time   07/21/11  5:35 AM      Component Value Range Comment   Sodium 135  135 - 145 (mEq/L)    Potassium 4.1  3.5 - 5.1 (mEq/L)    Chloride 101  96 - 112 (mEq/L)    CO2 25  19 - 32 (mEq/L)    Glucose, Bld 86  70 - 99 (mg/dL)    BUN 21  6 - 23 (mg/dL)    Creatinine, Ser 1.30 (*) 0.50 - 1.10 (mg/dL)    Calcium 8.6  8.4 - 10.5 (mg/dL)    GFR calc non Af Amer 44 (*) >90 (mL/min)    GFR calc Af Amer 51 (*) >90 (mL/min)   GLUCOSE, CAPILLARY     Status: Abnormal   Collection Time   07/21/11  7:17 AM      Component Value Range Comment   Glucose-Capillary 117 (*) 70 - 99 (mg/dL)    Comment 1 Notify RN      Comment 2 Documented in Chart     GLUCOSE, CAPILLARY     Status: Abnormal   Collection Time   07/21/11 10:43 AM      Component Value Range Comment   Glucose-Capillary 119 (*) 70 - 99 (mg/dL)    Comment 1 Notify RN      Comment 2 Documented in Chart     GLUCOSE, CAPILLARY     Status: Abnormal   Collection Time   07/21/11 12:55 PM      Component Value Range Comment   Glucose-Capillary 181 (*) 70 - 99 (mg/dL)     MICRO:  wound cx = PENDING   Assessment/Plan:  46 yo F with diabetes and deep wound infection:  1) recommend to change antibiotics to vancomycin (since she has history of MRSA) and piptazo (possible gram negatives, including pseudomonal coverage). Await culture results to decide how to narrow her antibiotic regimen.   Will provide more recommnedations once micro results return.  Duke Salvia Drue Second MD MPH [facility] [phone].

## 2011-07-21 NOTE — Progress Notes (Signed)
ANTIBIOTIC CONSULT NOTE - FOLLOW-UP  Pharmacy Consult for Vancomycin/Zosyn Indication: Recurrent abscess of R stump  Allergies  Allergen Reactions  . Fish Allergy Anaphylaxis and Itching  . Travatan (Travoprost) Itching   Patient Measurements: Height: 6\' 4"  (193 cm) Weight: 303 lb (137.44 kg) IBW/kg (Calculated) : 82.3   Vital Signs: Temp: 99.2 F (37.3 C) (05/21 1339) Temp src: Oral (05/21 1040) BP: 146/77 mmHg (05/21 1339) Pulse Rate: 90  (05/21 1339) Intake/Output from previous day: 05/20 0701 - 05/21 0700 In: 1300 [P.O.:600; I.V.:700] Out: 2311 [Urine:2301; Blood:10] Intake/Output from this shift: Total I/O In: 480 [P.O.:480] Out: -   Labs:  Basename 07/21/11 0535 07/20/11 1310  WBC 7.2 5.2  HGB 8.8* 8.6*  PLT 256 200  LABCREA -- --  CREATININE 1.39* 1.62*   Estimated Creatinine Clearance: 81.5 ml/min (by C-G formula based on Cr of 1.39). No results found for this basename: VANCOTROUGH:2,VANCOPEAK:2,VANCORANDOM:2,GENTTROUGH:2,GENTPEAK:2,GENTRANDOM:2,TOBRATROUGH:2,TOBRAPEAK:2,TOBRARND:2,AMIKACINPEAK:2,AMIKACINTROU:2,AMIKACIN:2, in the last 72 hours   Medical History: Past Medical History  Diagnosis Date  . Hypertension   . Thyroid disease   . Hypothyroidism   . Diabetic retinopathy   . Thalassemia minor   . Charcot ankle     AND FOOT - LEFT  . Dysrhythmia     PAST HX SVT--HAD HEART ABLATION 2002--NO PROBLEMS SINCE  . Osteomyelitis of foot 1993    PT REQUIRED BELOW THE KNEE AMPUTATION   . Diabetes mellitus     PT HAS INSULIN PUMP-DR. ALTHEIMER MANAGES PT'S PUMP  . Blood transfusion   . Chronic kidney disease     RENAL INSUFFICIENCY  . GERD (gastroesophageal reflux disease)     PT WOULD TAKE PROTONIX IF HAVING GERD  . Headache     2010--DX WITH CLUSTER H/A'S -JUST OCCAS BAD HEADACHE NOW  . 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 Depression   . Anxiety   . Sleep apnea     PT STATES SLEEP STUDY  ABOUT 13 YRS AGO-TOLD SHE HAD SLEEP APNEA BUT NEVER GIVEN CPAP   Assessment:  18 YOF with h/o R BKA ~20 years ago.  In Feb, 2013, pt trip/fall and landed on R knee (prosthetic leg).  Patient is s/p I&D 4/23/201 but with ongoing drainage from incision site, recurrent hematoma.  Patient is now s/p repeat I&D (5/20) and further debridement (5/21) and began vancomycin.   Patient received Ancef x 2 doses post-op (last dose 5/21 @ 05:26) and ID was consulted giving recommendation to initiate Zosyn in combination with Vancomycin.    Scr 1.39, Wt = 137.4 kg.  CrCl 81.5 ml/min, normalized 56 ml/min.  Of note, 4/23 abscess culture was negative. Pending 5/20 abscess culture  Goal of Therapy:  Vancomycin trough level 15-20 mcg/ml  Plan:   Vancomycin 1750 mg IV q24h  Begin Zosyn 3.375gm IV q8h (extended infusion)  Pharmacy will f/u daily  Gus Littler Trefz 07/21/2011,5:56 PM

## 2011-07-21 NOTE — Progress Notes (Signed)
CARE MANAGEMENT NOTE 07/21/2011  Patient:  [name], [name]   Account Number:  [id number]  Date Initiated:  07/21/2011  Documentation initiated by:  Colleen Can  Subjective/Objective Assessment:   dx recurrent hematoma rt leg; s/p rt leg abscess ; INcision and drainage 07/20/2011     Action/Plan:   CM spoke with patient. Plans are for patient to return to her home upon discharge. Pt states she already has DME. Has Picc line in place. Wants to use [facility] for [facility] services if needed. Was previously on home IV abx prior to this admission.   Anticipated DC Date:  07/22/2011   Anticipated DC Plan:  HOME W HOME HEALTH SERVICES  In-house referral  NA      DC Planning Services  CM consult      [facility] Choice  HOME HEALTH   Choice offered to / List presented to:  C-1 Patient   DME arranged  NA      DME agency  NA        [facility] agency  [facility]   Status of service:  In process, will continue to follow Medicare Important Message given?  NO (If response is "NO", the following Medicare IM given date fields will be blank)  Per UR Regulation:  Reviewed for med. necessity/level of care/duration of stay  If discussed at Long Length of Stay Meetings, dates discussed:    Comments:  LIst of ASgencies placed on shadow chart. Raynelle Bring BSN CCM [phone]

## 2011-07-21 NOTE — Op Note (Signed)
NAMECLEMENTINA, Simmons                ACCOUNT NO.:  [id number]  MEDICAL RECORD NO.:  [id number]  LOCATION:  1602                         FACILITY:  [facility]  PHYSICIAN:  Madlyn Frankel. Charlann Boxer, M.D.  DATE OF BIRTH:  1966-03-01  DATE OF PROCEDURE:  07/20/2011 DATE OF DISCHARGE:                              OPERATIVE REPORT   PREOPERATIVE DIAGNOSIS:  Right leg hematoma versus recurrent infection in a right below-the-knee amputation stump following recent incision and drainage for abscess.  POSTOPERATIVE DIAGNOSIS/FINDINGS:  The patient noted to have recurrent abscess collection within the right leg wound, with retained old hematoma within this area.  OTHER FINDINGS:  The posterior wound that was debrided at the last operation has healed to near completion at this point, without the use of graft.  PROCEDURE:  Repeat incisional debridement approximately 6 cm in length, excising the old skin and removing subcutaneous tissue old hematoma followed by irrigation with 3 L normal saline solution, followed by primary wound closure with a 2 cm segment left open for packing with iodoform gauze.  SURGEON:  Madlyn Frankel. Charlann Boxer, M.D.  ASSISTANT:  Lanney Gins, PA.  Note, that Mr. Carmon Sails was present for the entirety of the case, helpful for soft tissue management and primary wound closure.  ANESTHESIA:  General.  SPECIMENS:  I did sent new infected fluid sample from the right leg to Pathology for stat Gram stain, culture.  DRAINS:  None utilized as the wound was now packed with iodoform gauze.  TOURNIQUET TIME:  25 minutes at 250 mmHg.  ESTIMATED BLOOD LOSS:  Minimal.  INDICATIONS FOR PROCEDURE:  Ms. [name] is a pleasant 46 year old female with history of diabetes and longstanding right below the knee amputation.  Approximately 3 weeks ago,  she underwent an I and D due to an MRI confirmed abscess in the right leg.  She had recurrent swelling upon evaluation at the wound center with two separate  attempts at open it to drain blood.  She had progressive swelling and pain with low-grade fevers.  I felt at this point, it was best for [place] to take her back to the operating room to explore this, to try to make certain there was no recurrent infection or retained blood and then reclosed the wound.  The risks of recurrent complications or hematoma formation were discussed in this setting and consent was obtained for above.  PROCEDURE IN DETAIL:  The patient was brought to operative theater. Once adequate anesthesia and preoperative antibiotics, Ancef administered.  She was positioned supine with the right thigh tourniquet placed.  Utilizing a Betadine paint and scrub based on the open wound posteriorly, we scrubbed and painted the distal portion of the leg and then used DuraPrep on the proximal portion to the tourniquet.  Following prep and drape in a sterile fashion, time-out was performed identifying the patient, planned procedure, and extremity.  Based on elevation, I went ahead and elevated the tourniquet to 250 mmHg.  I utilized the similar incision excising a portion of the incision medially, which had been opened previously by the wound center and then extending it over anterolateral aspect of the stump.  Upon entering the deeper soft tissues, we encountered  a fluid collection that appeared purulent in nature and cultures were retaken and sent off for stat Gram stain, culture.  At this point, I continued to debride the incisional debridement, removing the subcutaneous tissue, and then removing old coagulated hematoma and then debriding the soft tissues in the surrounding area using a Cobb elevator.  Following this incisional and abrasive debridement, I irrigated the wound with 3 L normal saline solution.  At this point, the wound appeared to be relatively intact and clean appearing without evidence of any palpable tracks to the more proximal segments of her leg.  Following the  irrigation of the leg, I reapproximated the wound.  Given the fact that it was approximately 6-8 cm in length, we reapproximated all, but about a cm and half of it and then packed Iodoform gauze into this void.  The incision was then dressed with Xeroform, as was the posterior old wound, gauze, ABDs, and a sterile bulky wrap.  She was then extubated and brought to recovery room in stable condition. Tourniquet was let down at 25 minutes without significant hemostasis required.  We planned and contacted Infectious Disease for assistance in management.  Placed on vancomycin prophylactically postoperatively due to the recurrence of her infection.  She will be admitted as opposed to observe for recommendations of ID.  She already has a PICC line in place which we will maintain.     Madlyn Frankel Charlann Boxer, M.D.     MDO/MEDQ  D:  07/20/2011  T:  07/21/2011  Job:  [id number]

## 2011-07-22 ENCOUNTER — Encounter (HOSPITAL_COMMUNITY): Payer: Self-pay | Admitting: Orthopedic Surgery

## 2011-07-22 LAB — BASIC METABOLIC PANEL
CO2: 25 mEq/L (ref 19–32)
Calcium: 8.3 mg/dL — ABNORMAL LOW (ref 8.4–10.5)
Chloride: 101 mEq/L (ref 96–112)
Glucose, Bld: 180 mg/dL — ABNORMAL HIGH (ref 70–99)
Sodium: 134 mEq/L — ABNORMAL LOW (ref 135–145)

## 2011-07-22 LAB — GLUCOSE, CAPILLARY
Glucose-Capillary: 165 mg/dL — ABNORMAL HIGH (ref 70–99)
Glucose-Capillary: 126 mg/dL — ABNORMAL HIGH (ref 70–99)
Glucose-Capillary: 122 mg/dL — ABNORMAL HIGH (ref 70–99)
Glucose-Capillary: 97 mg/dL (ref 70–99)

## 2011-07-22 LAB — CBC
Hemoglobin: 8.5 g/dL — ABNORMAL LOW (ref 12.0–15.0)
MCH: 21.8 pg — ABNORMAL LOW (ref 26.0–34.0)
MCV: 69.7 fL — ABNORMAL LOW (ref 78.0–100.0)
RBC: 3.9 MIL/uL (ref 3.87–5.11)
WBC: 4.7 10*3/uL (ref 4.0–10.5)

## 2011-07-22 MED ORDER — HYDROCODONE-ACETAMINOPHEN 5-325 MG PO TABS
1.0000 | ORAL_TABLET | ORAL | Status: DC | PRN
  Administered 2011-07-22 – 2011-07-23 (×5): 2 via ORAL
  Filled 2011-07-22 (×5): qty 2

## 2011-07-22 NOTE — Progress Notes (Signed)
INFECTIOUS DISEASE PROGRESS NOTE  ID: [name] is a 46 y.o. female with  Diabetes who presents with deep wound infection secondary to trauma s/p ix d x 2  Subjective: Afebrile. Had nausea with pain medications. Wound was looked at this morning with surgery team and it appears still draining blood, good overall.   24hr events = few GNR growing from OR culture  Abtx: vanco #3 piptazo #2   Medications:    . atorvastatin  40 mg Oral QHS  . carvedilol  6.25 mg Oral BID WC  . docusate sodium  100 mg Oral BID  . escitalopram  20 mg Oral QPC breakfast  . felodipine  5 mg Oral QPC breakfast  . ferrous sulfate  325 mg Oral TID PC  . fluticasone  1 spray Each Nare Daily  . olmesartan  40 mg Oral QHS   And  . hydrochlorothiazide  12.5 mg Oral QHS  . insulin aspart  0-15 Units Subcutaneous TID WC  . insulin aspart  0-5 Units Subcutaneous QHS  . insulin pump  1 each Subcutaneous See admin instructions  . levothyroxine  250 mcg Oral QAC breakfast  . loratadine  10 mg Oral Daily  . pantoprazole  20 mg Oral Q breakfast  . piperacillin-tazobactam (ZOSYN)  IV  3.375 g Intravenous Q8H  . polyethylene glycol  17 g Oral BID  . rivaroxaban  10 mg Oral Q24H  . vancomycin  1,750 mg Intravenous Q24H  . DISCONTD: HYDROcodone-acetaminophen  1-2 tablet Oral Q4H    Objective: Vital signs in last 24 hours: Temp:  [98.4 F (36.9 C)-99.2 F (37.3 C)] 98.8 F (37.1 C) (05/22 0610) Pulse Rate:  [78-91] 91  (05/22 0610) Resp:  [16] 16  (05/22 0610) BP: (120-146)/(71-79) 144/79 mmHg (05/22 0610) SpO2:  [94 %-95 %] 94 % (05/22 0610)  gen = a x o by 3, in NAD Skin= right leg is not excessively warm, less swelling. Surgical site is wrapped  Lab Results  Basename 07/22/11 0400 07/21/11 0535  WBC 4.7 7.2  HGB 8.5* 8.8*  HCT 27.2* 28.9*  NA 134* 135  K 4.2 4.1  CL 101 101  CO2 25 25  BUN 21 21  CREATININE 1.51* 1.39*  GLU -- --    Microbiology: Recent Results (from the past 240  hour(s))  SURGICAL PCR SCREEN     Status: Normal   Collection Time   07/20/11  1:45 PM      Component Value Range Status Comment   MRSA, PCR NEGATIVE  NEGATIVE  Final    Staphylococcus aureus NEGATIVE  NEGATIVE  Final   CULTURE, ROUTINE-ABSCESS     Status: Normal (Preliminary result)   Collection Time   07/20/11  5:05 PM      Component Value Range Status Comment   Specimen Description LEG RIGHT   Final    Special Requests NONE   Final    Gram Stain     Final    Value: RARE WBC PRESENT,BOTH PMN AND MONONUCLEAR     RARE SQUAMOUS EPITHELIAL CELLS PRESENT     NO ORGANISMS SEEN   Culture FEW GRAM NEGATIVE RODS   Final    Report Status PENDING   Incomplete   ANAEROBIC CULTURE     Status: Normal (Preliminary result)   Collection Time   07/20/11  5:05 PM      Component Value Range Status Comment   Specimen Description LEG RIGHT   Final    Special Requests  NONE   Final    Gram Stain     Final    Value: RARE WBC PRESENT,BOTH PMN AND MONONUCLEAR     RARE SQUAMOUS EPITHELIAL CELLS PRESENT     NO ORGANISMS SEEN   Culture     Final    Value: NO ANAEROBES ISOLATED; CULTURE IN PROGRESS FOR 5 DAYS   Report Status PENDING   Incomplete     Assessment/Plan: Deep wound infection = would recommend 2 wks of antibiotics, regimen TBA depending on culture results. It appears a few GNRs are growing, await identification and susceptibilities. Until then, can continue with piptazo. Will discontinue vanco, since no GPC, especially MRSA. Depending on identification and susceptibilties, we maybe able to find an oral regimen.  Rasha Ibe Infectious Diseases 07/22/2011, 1:20 PM

## 2011-07-22 NOTE — Progress Notes (Signed)
  Subjective: 2 Days Post-Op Procedure(s) (LRB): IRRIGATION AND DEBRIDEMENT EXTREMITY (Right)   Patient reports pain as mild, pain well controlled. No events throughout the night. Antibiotics were changed per ID.   Objective:   VITALS:   Filed Vitals:   07/22/11 0610  BP: 144/79  Pulse: 91  Temp: 98.8 F (37.1 C)  Resp: 16    Incision: scant drainage No cellulitis present Compartment soft  LABS  Basename 07/22/11 0400 07/21/11 0535 07/20/11 1310  HGB 8.5* 8.8* 8.6*  HCT 27.2* 28.9* 27.4*  WBC 4.7 7.2 5.2  PLT 267 256 200     Basename 07/22/11 0400 07/21/11 0535 07/20/11 1310  NA 134* 135 135  K 4.2 4.1 4.1  BUN 21 21 28*  CREATININE 1.51* 1.39* 1.62*  GLUCOSE 180* 86 182*     Assessment/Plan: 2 Days Post-Op Procedure(s) (LRB): IRRIGATION AND DEBRIDEMENT EXTREMITY (Right)   Dressing changed without difficulty, patient tolerated it well Wound repacked with saline guaze, the stump was rewrapped and ACE bandage was applied. Plan for discharge tomorrow if she continues to do well. Waiting to see the results of the wound cultures.   Anastasio Auerbach Garvey Westcott   PAC  07/22/2011, 10:32 AM

## 2011-07-23 DIAGNOSIS — L03119 Cellulitis of unspecified part of limb: Secondary | ICD-10-CM

## 2011-07-23 DIAGNOSIS — L02419 Cutaneous abscess of limb, unspecified: Secondary | ICD-10-CM

## 2011-07-23 LAB — GLUCOSE, CAPILLARY
Glucose-Capillary: 79 mg/dL (ref 70–99)
Glucose-Capillary: 98 mg/dL (ref 70–99)
Glucose-Capillary: 84 mg/dL (ref 70–99)
Glucose-Capillary: 78 mg/dL (ref 70–99)

## 2011-07-23 LAB — CULTURE, ROUTINE-ABSCESS

## 2011-07-23 MED ORDER — CIPROFLOXACIN HCL 750 MG PO TABS
750.0000 mg | ORAL_TABLET | Freq: Two times a day (BID) | ORAL | Status: DC
  Administered 2011-07-23 – 2011-07-24 (×2): 750 mg via ORAL
  Filled 2011-07-23 (×5): qty 1

## 2011-07-23 NOTE — Progress Notes (Addendum)
INFECTIOUS DISEASE PROGRESS NOTE  ID: RHYLIN Simmons is a 46 y.o. female with  Diabetes who presents with deep wound infection secondary to trauma s/p ix d x 2  Subjective: Afebrile. Episode of Bs in 70s, but asymptomatic. Doing well otherwise.   24hr events = cx identified serratia in deep wound cx  Abtx: vanco #3 piptazo #2   Medications:    . atorvastatin  40 mg Oral QHS  . carvedilol  6.25 mg Oral BID WC  . docusate sodium  100 mg Oral BID  . escitalopram  20 mg Oral QPC breakfast  . felodipine  5 mg Oral QPC breakfast  . ferrous sulfate  325 mg Oral TID PC  . fluticasone  1 spray Each Nare Daily  . olmesartan  40 mg Oral QHS   And  . hydrochlorothiazide  12.5 mg Oral QHS  . insulin aspart  0-15 Units Subcutaneous TID WC  . insulin aspart  0-5 Units Subcutaneous QHS  . insulin pump  1 each Subcutaneous See admin instructions  . levothyroxine  250 mcg Oral QAC breakfast  . loratadine  10 mg Oral Daily  . pantoprazole  20 mg Oral Q breakfast  . piperacillin-tazobactam (ZOSYN)  IV  3.375 g Intravenous Q8H  . polyethylene glycol  17 g Oral BID  . rivaroxaban  10 mg Oral Q24H    Objective: Vital signs in last 24 hours: Temp:  [97.7 F (36.5 C)-98.6 F (37 C)] 97.7 F (36.5 C) (05/23 0550) Pulse Rate:  [74-78] 78  (05/23 0550) Resp:  [16-18] 18  (05/23 0550) BP: (113-125)/(70-78) 125/78 mmHg (05/23 0550) SpO2:  [98 %] 98 % (05/23 0550)  gen = a x o by 3, in NAD Skin= right leg has less swelling. Surgical site is wrapped  Lab Results  Basename 07/22/11 0400 07/21/11 0535  WBC 4.7 7.2  HGB 8.5* 8.8*  HCT 27.2* 28.9*  NA 134* 135  K 4.2 4.1  CL 101 101  CO2 25 25  BUN 21 21  CREATININE 1.51* 1.39*  GLU -- --    Microbiology: Recent Results (from the past 240 hour(s))  SURGICAL PCR SCREEN     Status: Normal   Collection Time   07/20/11  1:45 PM      Component Value Range Status Comment   MRSA, PCR NEGATIVE  NEGATIVE  Final    Staphylococcus aureus  NEGATIVE  NEGATIVE  Final   CULTURE, ROUTINE-ABSCESS     Status: Normal   Collection Time   07/20/11  5:05 PM      Component Value Range Status Comment   Specimen Description LEG RIGHT   Final    Special Requests NONE   Final    Gram Stain     Final    Value: RARE WBC PRESENT,BOTH PMN AND MONONUCLEAR     RARE SQUAMOUS EPITHELIAL CELLS PRESENT     NO ORGANISMS SEEN   Culture FEW SERRATIA MARCESCENS   Final    Report Status 07/23/2011 FINAL   Final    Organism ID, Bacteria SERRATIA MARCESCENS   Final   ANAEROBIC CULTURE     Status: Normal (Preliminary result)   Collection Time   07/20/11  5:05 PM      Component Value Range Status Comment   Specimen Description LEG RIGHT   Final    Special Requests NONE   Final    Gram Stain     Final    Value: RARE WBC PRESENT,BOTH PMN AND  MONONUCLEAR     RARE SQUAMOUS EPITHELIAL CELLS PRESENT     NO ORGANISMS SEEN   Culture     Final    Value: NO ANAEROBES ISOLATED; CULTURE IN PROGRESS FOR 5 DAYS   Report Status PENDING   Incomplete     Assessment/Plan: Deep wound infection = would recommend 2 wks of ciprofloxacin 750mg  PO twice a day for serratia ( R- cefazolin, S- cipro) deep wound infection -- Will discontinue IV piptazo. --  will discontinue picc line since no need for IV antibiotics  Will sign off.  Call if questions  Sydney Beecham B. Drue Second MD MPH [facility] [phone]  07/23/2011, 2:27 PM

## 2011-07-23 NOTE — Progress Notes (Signed)
Nurse tech notified me that patient stated she "Felt weird" and asked if her sugar could be checked. CBG read 50. Graham crackers and juice were given to patient at this time. Will recheck sugar in 15 minutes.

## 2011-07-23 NOTE — Progress Notes (Signed)
Patient's CBG rechecked and read 79. Patient is alert and oriented and shows no s/sx of distress or hypoglycemic symptoms. Will continue to monitor patient.

## 2011-07-24 LAB — GLUCOSE, CAPILLARY
Glucose-Capillary: 132 mg/dL — ABNORMAL HIGH (ref 70–99)
Glucose-Capillary: 124 mg/dL — ABNORMAL HIGH (ref 70–99)

## 2011-07-24 MED ORDER — DIPHENHYDRAMINE HCL 25 MG PO CAPS: 25.0000 mg | ORAL_CAPSULE | Freq: Four times a day (QID) | ORAL | Status: DC | PRN

## 2011-07-24 MED ORDER — CIPROFLOXACIN HCL 750 MG PO TABS: 750.0000 mg | ORAL_TABLET | Freq: Two times a day (BID) | ORAL | Status: AC

## 2011-07-24 MED ORDER — HYDROCODONE-ACETAMINOPHEN 5-325 MG PO TABS: 1.0000 | ORAL_TABLET | ORAL | Status: AC | PRN

## 2011-07-24 MED ORDER — ASPIRIN EC 325 MG PO TBEC: 325.0000 mg | DELAYED_RELEASE_TABLET | Freq: Two times a day (BID) | ORAL | Status: AC

## 2011-07-24 MED ORDER — POLYETHYLENE GLYCOL 3350 17 G PO PACK: 17.0000 g | PACK | Freq: Two times a day (BID) | ORAL | Status: AC | PRN

## 2011-07-24 MED ORDER — METHOCARBAMOL 500 MG PO TABS: 500.0000 mg | ORAL_TABLET | Freq: Four times a day (QID) | ORAL | Status: AC | PRN

## 2011-07-24 MED ORDER — DSS 100 MG PO CAPS: 100.0000 mg | ORAL_CAPSULE | Freq: Two times a day (BID) | ORAL | Status: AC | PRN

## 2011-07-24 NOTE — Progress Notes (Signed)
Patient and aunt given discharge information and prescriptions. Questions answered about home health nurse visits. Taken by wheelchair to personal vehicle.

## 2011-07-24 NOTE — Progress Notes (Signed)
Subjective: 4 Days Post-Op Procedure(s) (LRB): IRRIGATION AND DEBRIDEMENT EXTREMITY (Right)   Patient reports pain as mild, pain well controlled, No events throughout the night. Ready to be discharged home.  Objective:   VITALS:   Filed Vitals:   07/24/11 0840  BP: 132/79  Pulse: 76  Temp: 98.2 (36.8)  Resp: 16    Incision: scant drainage No cellulitis present Compartment soft  LABS  Basename 07/22/11 0400  HGB 8.5*  HCT 27.2*  WBC 4.7  PLT 267     Basename 07/22/11 0400  NA 134*  K 4.2  BUN 21  CREATININE 1.51*  GLUCOSE 180*     Assessment/Plan: 4 Days Post-Op Procedure(s) (LRB): IRRIGATION AND DEBRIDEMENT EXTREMITY (Right)   Dressing changed with Dr. Charlann Boxer without difficulty, patient tolerated it well  Wound repacked with saline guaze, the stump was rewrapped and ACE bandage was applied.  Plan for discharge today.  Final results of the cultures are in and ID has implemented their plan. Their input is very much appreciated.  We have arranged with Home health for wet to dry dressing changes, with a saline soaked 4x4 packing. Xeroform to the posterior wound and 4x4 gauze and ABD to anterior area. Rewrapped and cover in ACE bandage.    Anastasio Auerbach Gloristine Turrubiates   PAC  07/24/2011, 10:45 AM

## 2011-07-24 NOTE — Care Management Note (Signed)
    Page 1 of 2   07/24/2011     3:23:42 PM   CARE MANAGEMENT NOTE 07/24/2011  Patient:  [name], [name]   Account Number:  [id number]  Date Initiated:  07/21/2011  Documentation initiated by:  Colleen Can  Subjective/Objective Assessment:   dx recurrent hematoma rt leg; s/p rt leg abscess ; INcision and drainage 07/20/2011     Action/Plan:   CM spoke with patient. Plans are for patient to return to her home upon discharge. Pt states she already has DME. Has Picc line in place. Wants to use [facility] for [facility] services if needed. Was previously on home IV abx prior to this admission.   Anticipated DC Date:  07/22/2011   Anticipated DC Plan:  HOME W HOME HEALTH SERVICES  In-house referral  NA      DC Planning Services  CM consult      [facility] Choice  HOME HEALTH   Choice offered to / List presented to:  C-1 Patient   DME arranged  NA      DME agency  NA     HH arranged  HH-1 RN      [facility] agency  [facility]   Status of service:  Completed, signed off Medicare Important Message given?  NO (If response is "NO", the following Medicare IM given date fields will be blank) Date Medicare IM given:   Date Additional Medicare IM given:    Discharge Disposition:  HOME W HOME HEALTH SERVICES  Per UR Regulation:  Reviewed for med. necessity/level of care/duration of stay  If discussed at Long Length of Stay Meetings, dates discussed:    Comments:  07/24/2011 Raynelle Bring BSN CCM [phone] Pt for discharge today. Genevieve Norlander will provide HHrn for wound care/services to start tomorrow 07/25/2011. LIst of ASgencies placed on shadow chart. Raynelle Bring BSN CCM [phone]

## 2011-07-24 NOTE — Progress Notes (Signed)
Subjective: 3 Days Post-Op Procedure(s) (LRB): IRRIGATION AND DEBRIDEMENT EXTREMITY (Right)   Patient reports pain as mild, pain well controlled. No events throughout the night.   Objective:   VITALS:   Filed Vitals:   07/23/11 0550  BP: 125/78  Pulse: 78  Temp: 97.7 (36.5)  Resp: 18    Incision: scant drainage No cellulitis present Compartment soft  LABS No new labs   Assessment/Plan: 3 Days Post-Op Procedure(s) (LRB): IRRIGATION AND DEBRIDEMENT EXTREMITY (Right)   No dressing change today, will wait for tomorrow. Plan for discharge tomorrow if she continues to do well.  Waiting to see the results of the wound cultures and IDs final suggestions on antibiotics, which is greatly appreciated.    Anastasio Auerbach [name]   PAC  07/24/2011, 10:36 AM

## 2011-07-24 NOTE — Progress Notes (Signed)
Utilization review completed.  

## 2011-07-25 LAB — ANAEROBIC CULTURE

## 2011-07-27 NOTE — Progress Notes (Signed)
Discharge summary sent to payer through [facility]  

## 2011-08-03 NOTE — Progress Notes (Signed)
Subjective: 1 Day Post-Op Procedure(s) (LRB): IRRIGATION AND DEBRIDEMENT EXTREMITY (Right)   Patient reports pain as mild, pain well controlled. No events throughout the night. ID should see and evaluate for appropriateness of antibiotic treatment.   Objective:   VITALS:   Filed Vitals:   07/24/11   BP: 117/51  Pulse: 92  Temp: 99.8 F (37.7 C)   Resp: 16    Incision: scant drainage No cellulitis present Compartment soft   LABS   Basename  07/21/11 0535   HGB  8.8*   HCT  28.9*   WBC  7.2   PLT  256      Assessment/Plan: 1 Day Post-Op Procedure(s) (LRB): IRRIGATION AND DEBRIDEMENT EXTREMITY (Right)   Plan on changing the dressing tomorrow. No issues throughout the night  ID to see and check appropriateness of antibiotics, which is greatly appreciated.    [name]   PAC  08/03/2011, 8:53 AM

## 2011-08-03 NOTE — Discharge Summary (Signed)
Physician Discharge Summary  Patient ID: FAJR FIFE MRN: [id number] DOB/AGE: Aug 20, 1965 46 y.o.  Admit date: 07/20/2011 Discharge date:  07/24/2011  Procedures:  Procedure(s) (LRB): IRRIGATION AND DEBRIDEMENT EXTREMITY (Right)  Attending Physician:  Dr. 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 female complaining persistent drainage from incision from the I&D of the right leg abscess on 06/23/2011. When seen in the office she has persistent drainage from what is diagnosised as recurrent hematomas. Patient had a previous right BKA approximately 20 years ago. This had been doing well for her up until about a month ago. The patient states that, on April 16, 2011, she went out to [facility] to get some wings. As she was leaving the restraurant, her prosthetic leg got caught on a mat, causing her to trip and fall, landing on her right knee. Since that time, she has had an I&D of the right leg which has been doing well except for the constant draining. Risks, benefits and expectations were discussed with the patient. Patient understand the risks, benefits and expectations and wishes to proceed with surgery.   PCP: Alva Garnet., MD, MD   Discharged  Condition: good  Hospital Course:  Patient underwent the above stated procedure on 07/20/2011. Patient tolerated the procedure well and brought to the recovery room in good condition and subsequently to the floor.  POD #1 BP: 117/51 ; Pulse: 92 ; Temp: 99.8 F (37.7 C) ; Resp: 16  Pt's foley was removed, as well as the hemovac drain removed. IV was changed to a saline lock. Incision: scant drainage, no cellulitis present and compartment soft   LABS  Basename  07/21/11 0535   HGB  8.8  HCT  28.9   POD #2  BP: 144/79 ; Pulse: 91 ; Temp: 98.8 F (37.1 C) ; Resp: 16  Patient reports pain as mild, pain well controlled. No events throughout the night. Antibiotics were changed per ID. Incision: scant drainage, no cellulitis present and compartment soft. Packing was removed and saline soaked sterile 4x4 guaze was packed under sterile technique at the bedside. This was well tolerated.  LABS  Basename  07/22/11 0400   HGB  8.5  HCT  27.2   POD #3  BP: 125/78 ; Pulse: 78 ; Temp: 97.7 (36.5) ; Resp: 18 Patient reports pain as mild, pain well controlled. No events throughout the night. Incision: scant drainage, no cellulitis present and compartment soft  LABS   No new labs  POD #4 BP: 132/79 ; Pulse: 76 ; Temp: 98.2 (36.8) ; Resp: 16  Patient reports pain as mild, pain well controlled, No events throughout the night. Ready to be  discharged home. Incision: scant drainage, no cellulitis present and compartment soft. Packing was removed and saline soaked sterile 4x4 guaze was packed under sterile technique at the bedside. This was well tolerated.  LABS   No new labs   Discharge Exam: General appearance: alert, cooperative and no distress  Disposition: Home with home health and follow up in 2 weeks  Follow-up Information    Follow up with OLIN,Brenda Cowher D in 2 weeks.   Contact information:   [facility] [address] [place]  [address] [phone]          Discharge Orders    Future Appointments: Provider: Department: Dept Phone: Center:   08/12/2011 8:00 AM [facility] [phone] [facility]     Future Orders Please Complete By Expires   Diet - low sodium heart healthy      Call MD / Call 911      Comments:   If you experience chest pain or shortness of breath, CALL 911 and be transported to the hospital emergency room.  If you develope a fever above 101 F, pus (white drainage) or increased drainage or redness at the wound, or calf pain, call your surgeon's office.   Discharge instructions      Comments:   Home health to come out and change dressing daily if possible, every other day if not. Wet to dry dressing with packing the wound with a sterile 4x4 soaked in saline.  Cover the area with 4x4 guaze, ABD and dressing with ACE bandage. Posterior wound can have a Xeroform or other non-stick petroleum dressing and covered with 4x4 guaze.  Keep the area dry and clean until follow up. Follow up in 2 weeks at [facility]. Call with any questions or concerns.     Constipation Prevention      Comments:   Drink plenty of fluids.  Prune juice may be helpful.  You may use a stool softener, such as Colace (over the counter) 100 mg twice a day.  Use MiraLax (over the counter) for constipation as needed.   Increase activity slowly as tolerated      Driving restrictions      Comments:   No driving for 4 weeks   Change dressing      Comments:   Home health to come out and change dressing daily if possible, every other day if not. Wet to dry dressing with packing the wound with a sterile 4x4 soaked in saline.  Cover the area with 4x4 guaze, ABD and dressing with ACE bandage. Posterior wound can have a Xeroform or other non-stick petroleum dressing and covered with 4x4 guaze.  Keep the area dry and clean.      Discharge Medication List as of 07/24/2011 12:54 PM    START taking these  medications   Details  aspirin EC 325 MG tablet Take 1 tablet (325 mg total) by mouth 2 (two) times daily. X 4 weeks, Starting 07/24/2011, Until Mon 08/03/11, No Print    ciprofloxacin (CIPRO) 750 MG tablet Take 1 tablet (750 mg total) by mouth 2 (two) times daily., Starting 07/24/2011, Until Mon 08/03/11, Print    docusate sodium 100 MG CAPS Take 100 mg by mouth 2 (two) times daily as needed for constipation., Starting 07/24/2011, Until Mon 08/03/11, No Print    HYDROcodone-acetaminophen (NORCO) 5-325 MG per tablet Take 1-2 tablets by mouth every 4 (four) hours as needed for pain., Starting 07/24/2011, Until Mon 08/03/11, Print  methocarbamol (ROBAXIN) 500 MG tablet Take 1 tablet (500 mg total) by mouth every 6 (six) hours as needed (muscle spasms)., Starting 07/24/2011, Until Mon 08/03/11, Print    polyethylene glycol (MIRALAX / GLYCOLAX) packet Take 17 g by mouth 2 (two) times daily as needed., Starting 07/24/2011, Until Mon 07/27/11, No Print      CONTINUE these medications which have CHANGED   Details  diphenhydrAMINE (BENADRYL) 25 mg capsule Take 1 capsule (25 mg total) by mouth every 6 (six) hours as needed for itching, allergies or sleep., Starting 07/24/2011, Until Mon 08/03/11, No Print      CONTINUE these medications which have NOT CHANGED   Details  atorvastatin (LIPITOR) 40 MG tablet Take 40 mg by mouth at bedtime. , Until Discontinued, Historical Med    carvedilol (COREG) 6.25 MG tablet Take 6.25 mg by mouth 2 (two) times daily. , Until Discontinued, Historical Med    Cholecalciferol (VITAMIN D-3) 5000 UNITS TABS Take 5,000 Units by mouth daily., Until Discontinued, Historical Med    escitalopram (LEXAPRO) 20 MG tablet Take 20 mg by mouth daily after breakfast., Until Discontinued, Historical Med    felodipine (PLENDIL) 5 MG 24 hr tablet Take 5 mg by mouth daily after breakfast. , Until Discontinued, Historical Med    ferrous sulfate 325 (65 FE) MG tablet Take 1 tablet (325 mg total) by  mouth 3 (three) times daily after meals., Starting 06/24/2011, Until Thu 06/23/12, No Print    fexofenadine (ALLEGRA) 180 MG tablet Take 180 mg by mouth daily., Until Discontinued, Historical Med    Insulin Human (INSULIN PUMP) 100 unit/ml SOLN Inject 1 each into the skin See admin instructions. Patient is taking 2.25 units per hour., Until Discontinued, Historical Med    levothyroxine (SYNTHROID, LEVOTHROID) 125 MCG tablet Take 250 mcg by mouth daily before breakfast., Until Discontinued, Historical Med    mometasone (NASONEX) 50 MCG/ACT nasal spray Place 2 sprays into the nose daily as needed. Allergies , Until Discontinued, Historical Med    Multiple Vitamin (MULITIVITAMIN WITH MINERALS) TABS Take 1 tablet by mouth daily., Until Discontinued, Historical Med    olmesartan-hydrochlorothiazide (BENICAR HCT) 40-12.5 MG per tablet Take 1 tablet by mouth at bedtime., Until Discontinued, Historical Med    Olopatadine HCl (PATANASE) 0.6 % SOLN Place 1 drop into the nose daily as needed. Allergies , Until Discontinued, Historical Med    omega-3 acid ethyl esters (LOVAZA) 1 G capsule Take 2 g by mouth daily., Until Discontinued, Historical Med    OVER THE COUNTER MEDICATION SALINE NASAL SPRAY -OTC--WOULD DO ONE SPRAY EACH NOSTRIL PRIOR TO USING ANY OTHER NASAL SPRAY FOR HER ALLERGIES, Until Discontinued, Historical Med    pantoprazole (PROTONIX) 20 MG tablet Take 20 mg by mouth daily with breakfast. ONCE A DAY AS NEEDED--ONLY IF HAVING REFLUX-DOES NOT TAKE EVERYDAY, Until Discontinued, Historical Med      STOP taking these medications     ceFAZolin (ANCEF) 2-3 GM-% SOLR Comments:  Reason for Stopping:            Signed: Anastasio Auerbach. Dianah Pruett   PAC  08/03/2011, 8:42 AM

## 2011-08-05 ENCOUNTER — Encounter (HOSPITAL_BASED_OUTPATIENT_CLINIC_OR_DEPARTMENT_OTHER): Payer: BC Managed Care – PPO

## 2011-08-12 ENCOUNTER — Encounter (HOSPITAL_BASED_OUTPATIENT_CLINIC_OR_DEPARTMENT_OTHER): Payer: BC Managed Care – PPO

## 2012-01-25 ENCOUNTER — Other Ambulatory Visit (HOSPITAL_COMMUNITY): Payer: Self-pay | Admitting: *Deleted

## 2012-01-26 ENCOUNTER — Encounter (HOSPITAL_COMMUNITY)
Admission: RE | Admit: 2012-01-26 | Discharge: 2012-01-26 | Disposition: A | Discharge status: Home / Self Care | Payer: BC Managed Care – PPO | Source: Ambulatory Visit | Attending: Nephrology | Admitting: Nephrology

## 2012-01-26 DIAGNOSIS — D509 Iron deficiency anemia, unspecified: Secondary | ICD-10-CM | POA: Insufficient documentation

## 2012-01-26 MED ORDER — FERUMOXYTOL INJECTION 510 MG/17 ML
INTRAVENOUS | Status: AC
  Administered 2012-01-26: 510 mg via INTRAVENOUS
  Filled 2012-01-26: qty 17

## 2012-01-26 MED ORDER — SODIUM CHLORIDE 0.9 % IV SOLN
INTRAVENOUS | Status: DC
  Administered 2012-01-26: 11:00:00 via INTRAVENOUS

## 2012-01-26 MED ORDER — FERUMOXYTOL INJECTION 510 MG/17 ML
510.0000 mg | INTRAVENOUS | Status: DC
  Administered 2012-01-26: 510 mg via INTRAVENOUS

## 2012-02-01 ENCOUNTER — Inpatient Hospital Stay (HOSPITAL_COMMUNITY)
Admission: AD | Admit: 2012-02-01 | Discharge: 2012-02-09 | DRG: 217 | Disposition: A | Discharge status: Home Health | Payer: BC Managed Care – PPO | Source: Ambulatory Visit | Attending: Internal Medicine | Admitting: Internal Medicine

## 2012-02-01 ENCOUNTER — Encounter (HOSPITAL_COMMUNITY): Payer: Self-pay | Admitting: General Practice

## 2012-02-01 DIAGNOSIS — E785 Hyperlipidemia, unspecified: Secondary | ICD-10-CM | POA: Diagnosis present

## 2012-02-01 DIAGNOSIS — E11319 Type 2 diabetes mellitus with unspecified diabetic retinopathy without macular edema: Secondary | ICD-10-CM | POA: Diagnosis present

## 2012-02-01 DIAGNOSIS — F341 Dysthymic disorder: Secondary | ICD-10-CM | POA: Diagnosis present

## 2012-02-01 DIAGNOSIS — I119 Hypertensive heart disease without heart failure: Secondary | ICD-10-CM | POA: Diagnosis present

## 2012-02-01 DIAGNOSIS — E039 Hypothyroidism, unspecified: Secondary | ICD-10-CM | POA: Diagnosis present

## 2012-02-01 DIAGNOSIS — T8743 Infection of amputation stump, right lower extremity: Secondary | ICD-10-CM | POA: Diagnosis present

## 2012-02-01 DIAGNOSIS — K219 Gastro-esophageal reflux disease without esophagitis: Secondary | ICD-10-CM | POA: Diagnosis present

## 2012-02-01 DIAGNOSIS — Y835 Amputation of limb(s) as the cause of abnormal reaction of the patient, or of later complication, without mention of misadventure at the time of the procedure: Secondary | ICD-10-CM | POA: Diagnosis present

## 2012-02-01 DIAGNOSIS — E1140 Type 2 diabetes mellitus with diabetic neuropathy, unspecified: Secondary | ICD-10-CM | POA: Diagnosis present

## 2012-02-01 DIAGNOSIS — G4733 Obstructive sleep apnea (adult) (pediatric): Secondary | ICD-10-CM | POA: Diagnosis present

## 2012-02-01 DIAGNOSIS — Z79899 Other long term (current) drug therapy: Secondary | ICD-10-CM

## 2012-02-01 DIAGNOSIS — L02419 Cutaneous abscess of limb, unspecified: Secondary | ICD-10-CM | POA: Diagnosis present

## 2012-02-01 DIAGNOSIS — E1142 Type 2 diabetes mellitus with diabetic polyneuropathy: Secondary | ICD-10-CM | POA: Diagnosis present

## 2012-02-01 DIAGNOSIS — E1039 Type 1 diabetes mellitus with other diabetic ophthalmic complication: Secondary | ICD-10-CM | POA: Diagnosis present

## 2012-02-01 DIAGNOSIS — E063 Autoimmune thyroiditis: Secondary | ICD-10-CM | POA: Diagnosis present

## 2012-02-01 DIAGNOSIS — N184 Chronic kidney disease, stage 4 (severe): Secondary | ICD-10-CM | POA: Diagnosis present

## 2012-02-01 DIAGNOSIS — M129 Arthropathy, unspecified: Secondary | ICD-10-CM | POA: Diagnosis present

## 2012-02-01 DIAGNOSIS — D563 Thalassemia minor: Secondary | ICD-10-CM | POA: Diagnosis present

## 2012-02-01 DIAGNOSIS — E108 Type 1 diabetes mellitus with unspecified complications: Secondary | ICD-10-CM | POA: Diagnosis present

## 2012-02-01 DIAGNOSIS — Z794 Long term (current) use of insulin: Secondary | ICD-10-CM

## 2012-02-01 DIAGNOSIS — E1049 Type 1 diabetes mellitus with other diabetic neurological complication: Secondary | ICD-10-CM | POA: Diagnosis present

## 2012-02-01 DIAGNOSIS — N183 Chronic kidney disease, stage 3 unspecified: Secondary | ICD-10-CM | POA: Diagnosis present

## 2012-02-01 DIAGNOSIS — T8740 Infection of amputation stump, unspecified extremity: Principal | ICD-10-CM | POA: Diagnosis present

## 2012-02-01 DIAGNOSIS — E7800 Pure hypercholesterolemia, unspecified: Secondary | ICD-10-CM | POA: Diagnosis present

## 2012-02-01 DIAGNOSIS — E1065 Type 1 diabetes mellitus with hyperglycemia: Secondary | ICD-10-CM | POA: Diagnosis present

## 2012-02-01 DIAGNOSIS — L03119 Cellulitis of unspecified part of limb: Secondary | ICD-10-CM | POA: Diagnosis present

## 2012-02-01 DIAGNOSIS — D509 Iron deficiency anemia, unspecified: Secondary | ICD-10-CM | POA: Diagnosis present

## 2012-02-01 DIAGNOSIS — Z96649 Presence of unspecified artificial hip joint: Secondary | ICD-10-CM

## 2012-02-01 DIAGNOSIS — I129 Hypertensive chronic kidney disease with stage 1 through stage 4 chronic kidney disease, or unspecified chronic kidney disease: Secondary | ICD-10-CM | POA: Diagnosis present

## 2012-02-01 HISTORY — DX: Osteoarthritis of hip, unspecified: M16.9

## 2012-02-01 HISTORY — DX: Iron deficiency anemia, unspecified: D50.9

## 2012-02-01 HISTORY — DX: Supraventricular tachycardia: I47.1

## 2012-02-01 HISTORY — DX: Pure hypercholesterolemia, unspecified: E78.00

## 2012-02-01 HISTORY — DX: Autoimmune thyroiditis: E06.3

## 2012-02-01 HISTORY — DX: Type 1 diabetes mellitus without complications: E10.9

## 2012-02-01 HISTORY — DX: Chronic kidney disease, stage 2 (mild): N18.2

## 2012-02-01 LAB — GLUCOSE, CAPILLARY
Glucose-Capillary: 320 mg/dL — ABNORMAL HIGH (ref 70–99)
Glucose-Capillary: 302 mg/dL — ABNORMAL HIGH (ref 70–99)
Glucose-Capillary: 308 mg/dL — ABNORMAL HIGH (ref 70–99)

## 2012-02-01 LAB — COMPREHENSIVE METABOLIC PANEL
Albumin: 3.1 g/dL — ABNORMAL LOW (ref 3.5–5.2)
BUN: 40 mg/dL — ABNORMAL HIGH (ref 6–23)
Calcium: 8.7 mg/dL (ref 8.4–10.5)
Creatinine, Ser: 2.1 mg/dL — ABNORMAL HIGH (ref 0.50–1.10)
GFR calc Af Amer: 31 mL/min — ABNORMAL LOW (ref >90)
Glucose, Bld: 325 mg/dL — ABNORMAL HIGH (ref 70–99)
Total Protein: 7.5 g/dL (ref 6.0–8.3)

## 2012-02-01 LAB — SEDIMENTATION RATE: Sed Rate: 63 mm/hr — ABNORMAL HIGH (ref 0–22)

## 2012-02-01 MED ORDER — PANTOPRAZOLE SODIUM 20 MG PO TBEC
20.0000 mg | DELAYED_RELEASE_TABLET | Freq: Every day | ORAL | Status: DC
  Administered 2012-02-02 – 2012-02-09 (×8): 20 mg via ORAL
  Filled 2012-02-01 (×9): qty 1

## 2012-02-01 MED ORDER — LORATADINE 10 MG PO TABS
10.0000 mg | ORAL_TABLET | Freq: Every day | ORAL | Status: DC
  Administered 2012-02-02 – 2012-02-09 (×7): 10 mg via ORAL
  Filled 2012-02-01 (×8): qty 1

## 2012-02-01 MED ORDER — CARVEDILOL 6.25 MG PO TABS
6.2500 mg | ORAL_TABLET | Freq: Two times a day (BID) | ORAL | Status: DC
  Administered 2012-02-01 – 2012-02-08 (×15): 6.25 mg via ORAL
  Filled 2012-02-01 (×17): qty 1

## 2012-02-01 MED ORDER — VITAMIN D-3 125 MCG (5000 UT) PO TABS: 5000.0000 [IU] | ORAL_TABLET | Freq: Every day | ORAL | Status: DC

## 2012-02-01 MED ORDER — INSULIN PUMP
SUBCUTANEOUS | Status: DC
  Administered 2012-02-01: 25.2 via SUBCUTANEOUS
  Administered 2012-02-01: 0.2 via SUBCUTANEOUS
  Administered 2012-02-01: 17.4 via SUBCUTANEOUS
  Administered 2012-02-02 – 2012-02-03 (×5): via SUBCUTANEOUS
  Administered 2012-02-04: 12.5 via SUBCUTANEOUS
  Administered 2012-02-04: 04:00:00 via SUBCUTANEOUS
  Administered 2012-02-05: 23.7 via SUBCUTANEOUS
  Administered 2012-02-06: 16.7 via SUBCUTANEOUS
  Administered 2012-02-06: 16 via SUBCUTANEOUS
  Administered 2012-02-06: 13.3 via SUBCUTANEOUS
  Administered 2012-02-06: 10.5 via SUBCUTANEOUS
  Administered 2012-02-06: 2.9 via SUBCUTANEOUS
  Administered 2012-02-07: 25.5 via SUBCUTANEOUS
  Administered 2012-02-07: 24.6 via SUBCUTANEOUS
  Administered 2012-02-08: 7 via SUBCUTANEOUS
  Administered 2012-02-08: 7.2 via SUBCUTANEOUS
  Administered 2012-02-08: 15.5 via SUBCUTANEOUS
  Administered 2012-02-08: 18.4 via SUBCUTANEOUS
  Filled 2012-02-01: qty 1

## 2012-02-01 MED ORDER — VANCOMYCIN HCL 1000 MG IV SOLR
2000.0000 mg | Freq: Once | INTRAVENOUS | Status: AC
  Administered 2012-02-01: 2000 mg via INTRAVENOUS
  Filled 2012-02-01: qty 2000

## 2012-02-01 MED ORDER — FELODIPINE ER 5 MG PO TB24
5.0000 mg | ORAL_TABLET | Freq: Every day | ORAL | Status: DC
  Administered 2012-02-02 – 2012-02-09 (×8): 5 mg via ORAL
  Filled 2012-02-01 (×9): qty 1

## 2012-02-01 MED ORDER — ACETAMINOPHEN 325 MG PO TABS
650.0000 mg | ORAL_TABLET | Freq: Four times a day (QID) | ORAL | Status: DC | PRN
  Administered 2012-02-01 – 2012-02-02 (×2): 650 mg via ORAL
  Filled 2012-02-01 (×2): qty 2

## 2012-02-01 MED ORDER — MORPHINE SULFATE 2 MG/ML IJ SOLN: 2.0000 mg | INTRAMUSCULAR | Status: DC | PRN

## 2012-02-01 MED ORDER — SODIUM CHLORIDE 0.9 % IV SOLN: 250.0000 mL | INTRAVENOUS | Status: DC | PRN

## 2012-02-01 MED ORDER — LEVOTHYROXINE SODIUM 125 MCG PO TABS
250.0000 ug | ORAL_TABLET | Freq: Every day | ORAL | Status: DC
  Administered 2012-02-02 – 2012-02-09 (×8): 250 ug via ORAL
  Filled 2012-02-01 (×9): qty 2

## 2012-02-01 MED ORDER — PIPERACILLIN-TAZOBACTAM 3.375 G IVPB
3.3750 g | Freq: Three times a day (TID) | INTRAVENOUS | Status: DC
  Administered 2012-02-01 – 2012-02-09 (×24): 3.375 g via INTRAVENOUS
  Filled 2012-02-01 (×28): qty 50

## 2012-02-01 MED ORDER — VITAMIN D3 25 MCG (1000 UNIT) PO TABS
5000.0000 [IU] | ORAL_TABLET | Freq: Every day | ORAL | Status: DC
  Administered 2012-02-02 – 2012-02-09 (×7): 5000 [IU] via ORAL
  Filled 2012-02-01 (×8): qty 5

## 2012-02-01 MED ORDER — SODIUM CHLORIDE 0.9 % IJ SOLN
3.0000 mL | Freq: Two times a day (BID) | INTRAMUSCULAR | Status: DC
  Administered 2012-02-02 – 2012-02-08 (×5): 3 mL via INTRAVENOUS

## 2012-02-01 MED ORDER — HYDROCODONE-ACETAMINOPHEN 5-325 MG PO TABS
1.0000 | ORAL_TABLET | ORAL | Status: DC | PRN
  Administered 2012-02-06: 2 via ORAL
  Filled 2012-02-01: qty 2

## 2012-02-01 MED ORDER — OMEGA-3-ACID ETHYL ESTERS 1 G PO CAPS
2.0000 g | ORAL_CAPSULE | Freq: Every day | ORAL | Status: DC
  Administered 2012-02-02 – 2012-02-09 (×7): 2 g via ORAL
  Filled 2012-02-01 (×8): qty 2

## 2012-02-01 MED ORDER — VANCOMYCIN HCL 10 G IV SOLR
2000.0000 mg | INTRAVENOUS | Status: DC
  Filled 2012-02-01: qty 2000

## 2012-02-01 MED ORDER — ATORVASTATIN CALCIUM 40 MG PO TABS
40.0000 mg | ORAL_TABLET | Freq: Every day | ORAL | Status: DC
  Administered 2012-02-01 – 2012-02-08 (×8): 40 mg via ORAL
  Filled 2012-02-01 (×10): qty 1

## 2012-02-01 MED ORDER — FLUTICASONE PROPIONATE 50 MCG/ACT NA SUSP
1.0000 | Freq: Every day | NASAL | Status: DC
  Administered 2012-02-06 – 2012-02-07 (×2): 1 via NASAL
  Filled 2012-02-01: qty 16

## 2012-02-01 MED ORDER — ADULT MULTIVITAMIN W/MINERALS CH
1.0000 | ORAL_TABLET | Freq: Every day | ORAL | Status: DC
  Administered 2012-02-02 – 2012-02-09 (×7): 1 via ORAL
  Filled 2012-02-01 (×8): qty 1

## 2012-02-01 MED ORDER — ENOXAPARIN SODIUM 80 MG/0.8ML ~~LOC~~ SOLN
70.0000 mg | SUBCUTANEOUS | Status: DC
  Administered 2012-02-01 – 2012-02-02 (×2): 70 mg via SUBCUTANEOUS
  Filled 2012-02-01 (×3): qty 0.8

## 2012-02-01 MED ORDER — LOSARTAN POTASSIUM 50 MG PO TABS
50.0000 mg | ORAL_TABLET | Freq: Every day | ORAL | Status: DC
  Administered 2012-02-02 – 2012-02-07 (×6): 50 mg via ORAL
  Filled 2012-02-01 (×6): qty 1

## 2012-02-01 MED ORDER — ACETAMINOPHEN 650 MG RE SUPP: 650.0000 mg | Freq: Four times a day (QID) | RECTAL | Status: DC | PRN

## 2012-02-01 MED ORDER — SODIUM CHLORIDE 0.9 % IJ SOLN: 3.0000 mL | INTRAMUSCULAR | Status: DC | PRN

## 2012-02-01 MED ORDER — FERROUS SULFATE 325 (65 FE) MG PO TABS
325.0000 mg | ORAL_TABLET | Freq: Three times a day (TID) | ORAL | Status: DC
  Administered 2012-02-02 – 2012-02-09 (×19): 325 mg via ORAL
  Filled 2012-02-01 (×25): qty 1

## 2012-02-01 MED ORDER — FUROSEMIDE 80 MG PO TABS
80.0000 mg | ORAL_TABLET | Freq: Every day | ORAL | Status: DC
  Administered 2012-02-01 – 2012-02-03 (×3): 80 mg via ORAL
  Filled 2012-02-01 (×4): qty 1

## 2012-02-01 MED ORDER — ESCITALOPRAM OXALATE 20 MG PO TABS
20.0000 mg | ORAL_TABLET | Freq: Every day | ORAL | Status: DC
  Administered 2012-02-02 – 2012-02-09 (×8): 20 mg via ORAL
  Filled 2012-02-01 (×9): qty 1

## 2012-02-01 NOTE — H&P (Signed)
Vital Signs  Entered weight:  329  lbs., Calculated Weight: 329 lbs., ( 149.23 kg) Height: 74 in., ( 187.96 cm) Temperature: 98.6 deg F, Temperature site: oral Pulse rate: 92 Pulse rhythm: regular  Blood Pressure #1: 118 / 70 mm Hg    BMI: 42.24 BSA: 2.69 Wt Chg: -4 lbs since 01/21/2012  Vitals entered by: Sara Chu LPN on February 01, 2012 10:37 AM        Risk Factors:   Tobacco use:  never smoker Passive smoke exposure:  yes Drug use:  no HIV high-risk behavior:  no Caffeine use:  1 drinks per day Alcohol use:  yes    Type:  rarely    Drinks per day:  <1 Seatbelt use:  100 % Sun Exposure:  occasionally  Colonoscopy History:    Date of Last Colonoscopy:  10/21/2010  Mammogram History:    Date of Last Mammogram:  11/09/2011  PAP Smear History:    Date of Last PAP Smear:  11/09/2011  History of Present Illness  History from: patient Reason for visit: See chief complaint Chief Complaint: Open area, swelling on stump-running fever since saturday History of Present Illness: Patient presents with swelling and fever around right BKA since Saturday.  Dr. Waynard Edwards called her in Doxy 100mg  bid and she started this Saturday.  Fever continued yesterday to 100.1.   Had I&D of site April and May this year was on IV abx at home for 6 weeks into June by Dr. Charlann Boxer.  Entire body hurts.  Blood sugars have been ranging in the 300s since this started.  Had been doing better prior to this starting since [place] increased her basal rates to 2.9 units/hr. (Basal:  12a 2.9 Bolus:  CHO 4, ISF 14, target 110) ROS as follows:   Review of Systems  General:       Complains of fevers, chills, headache, anorexia, malaise.   Ears/Nose/Throat:       Complains of earache, nasal congestion.        Denies sore throat, hoarseness, dysphagia.   Cardiovascular:       Complains of dyspnea on exertion.        Denies chest pains, claudication, palpitations, syncope.   Respiratory:       Complains of  cough, dyspnea.        Denies excessive sputum, wheezing.   Gastrointestinal:       Denies nausea, vomiting, change in bowel habits, abdominal pain.   Musculoskeletal:       Complains of see HPI.        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   Physicians: DM - Endocrine - Dr Altheimer  prior PCP - IM - Dr Andi Devon  retinal bleeding, cataracts - Optho - Dr Norma Fredrickson w/ [facility]  GYN - Dr Lloyd Huger  Podiatry - Dr Tawanna Cooler - Dr Durene Romans   Surgical History (reviewed - no changes required): I&D of R knee stump 06/2011  uterine biopsy 2012 - benign  L hip arthroplasty 2011 R cataract removal 2007 Laser surgery of R eye for retinal bleeding 2005 L cataract removal 2005 carpal tunnel release 2004 vitrectomy w/ rentinal  reattachment 2004 Laser surgery of L eye for retinal bleeding 2004  R stump BKA I&D 2004   Family History (reviewed - no changes required): Dad - d- goiter, DM, HTN, gout, CKD  Mom - NIDDM, HTN, glaucoma, MVP, arthritis, gout siblings - RA, bastric bypass, leukemia, crohn's disease, epilepsy  great aunt - colon cancer  great aunt - breast cancer   Social History (reviewed - no changes required): masters education in nursing. completing PhD in education. lives at home alone. single.  tob - none EtOH - 0-1 per day Drugs - none   Physical Exam  General appearance: tall AA female w/ NAD   Eyes  External: conjunctivae and lids normal Pupils: minimally reactive pupil to light B/L. EOMI   Ears, Nose and Throat  External ears: normal, no lesions or deformities External nose: normal, no lesions or deformities Otoscopic: canals clear,  tympanic membranes intact, slight erytheam right tm Hearing: grossly intact Pharynx: tongue normal, protrudes mid line,  posterior pharynx without erythema or exudate  Neck  Neck: supple, no masses, trachea midline  Respiratory  Respiratory effort: no intercostal retractions or use of accessory muscles Auscultation: no rales, rhonchi, or wheezes  Cardiovascular  Auscultation: S1, S2, no murmur, rub, or gallop Pedal pulses: pulses 2+ on left  Periph. circulation: no cyanosis, clubbing, edema on LLE ; R BKA   Gastrointestinal  Abdomen: soft, non-tender, no masses, bowel sounds normal  Musculoskeletal  Gait and station: right BKA with prosthesis walks with a cane RLE: BKA-swelling below patella anteriorally, warmth from tip of prosthesis up femor to right hip, some mild erythema. Opening below patella dressing with some minimal serous drainage not able to reproduce, no acute abscess noted but palpation reveals fluctuance below patella  LLE: 2+ DP pulse, transmetatarsal amputation, no ulcers or limited ROM    Skin  Inspection: sub-CM, erythematous, open wounds on her arms and legs B/L   Mental Status Exam  Judgment, insight: intact Orientation: oriented to time, place, and person Mood and affect: Normal Mood  LABS-  CBC normal- WBC 7.5, Hg 11.0, Platelets 295 Sed Rate and CRP pending  Impression & Recommendations:  Problem # 1:  CELLULITIS, KNEE, RIGHT (ICD-682.6) Assessed by Dr. Levy Pupa completed 5 doses of Doxy and afebrile currently WBC normal-stat labs ordered With previous history and exam recommend she proceed to hospital admission for IV abx.  Bed obtained at [facility].  Instructed to go straight to admission and orders would be initiated by Dr. Clelia Croft- (on call for Primary-Dr. Link Snuffer) Patient verbalizes understanding-scheduled for Iron infusion at [facility] outpatient tommorow 12/13 Orders: Venipuncture ([id number]) CBC/Platelets ([id number]) Sed Rate ([id number]) CRP.  Quantitative ([id number])   Problem # 2:  BKA, RIGHT LEG (ICD-V49.75)  Orders: Sed Rate ([id number]) CRP. Quantitative [phone])   Problem # 3:  CHRONIC KIDNEY DISEASE STAGE III (MODERATE) (ICD-585.3)  Labs Reviewed: BUN: 39 (11/09/2011)   Cr: 2.1 (11/09/2011)    Ca++: 9.8 (11/09/2011)      Problem # 4:  ANEMIA, IRON DEFICIENCY, CHRONIC (ICD-280.9)  Her updated medication list for this problem includes:    [facility] Iron 325 (65 Fe) Mg Tabs (Ferrous sulfate) .Marland Kitchen... Take 2 tabs daily  Scheduled for FeraHeme infusion 02/02/12 [place] Her updated medication list for this problem includes:    [facility] Iron 325 (65 Fe) Mg Tabs (Ferrous sulfate) .Marland Kitchen... Take 2 tabs daily   Problem # 5:  DIABETES MELLITUS, TYPE I, UNCONTROLLED, WITH COMPLICATIONS (ICD-250.93) Basal change:  12a 2.9 Bolus:  CHO 4, ISF14,  target 110 Her updated medication list for this problem includes:    Humalog 100 Unit/ml Soln (Insulin lispro (human)) ..... Use in insulin pump - up to 150 units per day    Adult Aspirin Ec Low Strength 81 Mg Tbec (Aspirin) .Marland Kitchen... Take 1 tab daily  Labs Reviewed: HgBA1c: 10.3 (11/09/2011)   Creat: 2.1 (11/09/2011)     Last Eye Exam: appt 8/29 Dr Jamie Kato (11/09/2011)       Current Meds:  ONETOUCH VERIO IQ STRP (GLUCOSE BLOOD) Use to self-monitor blood glucose 10 times daily and as needed FUROSEMIDE 40 MG TABS (FUROSEMIDE) Take 1 tablet twice daily HUMALOG 100 UNIT/ML SOLN (INSULIN LISPRO (HUMAN)) Use in insulin pump - up to 150 units per day HYDROXYZINE HCL 50 MG TABS (HYDROXYZINE HCL) take 1 tab by mouth every 6 hours as needed for itching [facility] IRON 325 (65 FE) MG TABS (FERROUS SULFATE) take 2 tabs daily PATANASE 0.6 % SOLN (OLOPATADINE HCL) take 2 sprays in each nostril as needed two times per day CARVEDILOL 6.25 MG TABS (CARVEDILOL) take 1 tab two times per day FELODIPINE ER 5 MG XR24H-TAB (FELODIPINE) take 1 tab daily ATORVASTATIN CALCIUM 40 MG TABS (ATORVASTATIN CALCIUM) take 1 tab at  bedtime ESCITALOPRAM OXALATE 20 MG TABS (ESCITALOPRAM OXALATE) take 1 tab daily LOVAZA 1 GM CAPS (OMEGA-3-ACID ETHYL ESTERS) take 1 cap daily ADULT ASPIRIN EC LOW STRENGTH 81 MG TBEC (ASPIRIN) take 1 tab daily HM VITAMIN D3 4000 UNIT CAPS (CHOLECALCIFEROL) take 1 tab daily ALLEGRA ALLERGY 180 MG TABS (FEXOFENADINE HCL) take 1 tab daily NASONEX 50 MCG/ACT SUSP (MOMETASONE FUROATE) 2 sprays in each nostril as needed daily   FLOWSHEET Values: Colonoscopy:done Dr Man (10/21/2010 8:26:33 AM) Chest X-Ray: EKG: DEXA: 2011 - normal  (11/09/2011 8:26:33 AM) Smoking Status: never smoker (02/01/2012 10:37:26 AM) Td Vaccine: never  (11/09/2011 8:26:33 AM) Pneumovax:  Zostavax:  Eye Examination:  appt 8/29 Dr Jamie Kato (11/09/2011 8:26:33 AM) EGFR: 30.4 (?) (11/09/2011 3:51:00 PM) ,25.1 (?) (11/09/2011 3:51:00 PM)    FOLLOW UP PLANS    Next office visit in : pending  Patient Goals/Instructions: To [facility] admitting-bed already in process    Electronically signed by Georga Hacking  NP on 02/01/2012 at 1:34 PM  I have seen and examined patient with Georga Hacking, NP and agree with above documentation.  Patient with persistent right lower extremity cellulitis associated with fluctuance and probable abscess.  Has failed outpatient oral antibiotics.  Will admit for IV Vanc/Zosyn and Orthopaedic evaluation (discussed with Dr. Charlann Boxer).  Likely will require I&D.  Will continue insulin pump at home settings and involve DM educator due to poorly controlled DM1 which is likely contributing to recurrent infectious issues.

## 2012-02-01 NOTE — Progress Notes (Signed)
Pt's CBG - 302.  MD notified and new orders received.  Pt increased basal rate to 3 units/hr.  Will pass information along to next shift RN.    Hector Shade [place]

## 2012-02-01 NOTE — Progress Notes (Signed)
Dr. Charlann Boxer notified of pt's room number.  Hector Shade [place]

## 2012-02-01 NOTE — Progress Notes (Signed)
Pt's CBG - 320.  MD notified and STAT serum ketones ordered.  MD stated to have pt give bolus and recheck CBG in 1 hr.  Will carry out orders and continue to monitor.  Hector Shade [place]

## 2012-02-01 NOTE — Progress Notes (Signed)
ANTIBIOTIC CONSULT NOTE - INITIAL  Pharmacy Consult for Vancomycin, Zosyn Indication: Cellulitis, probable abscess  Allergies  Allergen Reactions  . Fish Allergy Anaphylaxis and Itching  . Travatan (Travoprost) Itching    Patient Measurements: Height: 6\' 2"  (188 cm) Weight: 329 lb (149.233 kg) IBW/kg (Calculated) : 77.7   Vital Signs:   Intake/Output from previous day:   Intake/Output from this shift:    Labs: No results found for this basename: WBC:3,HGB:3,PLT:3,LABCREA:3,CREATININE:3 in the last 72 hours Estimated Creatinine Clearance: 76.5 ml/min (by C-G formula based on Cr of 1.51). No results found for this basename: VANCOTROUGH:2,VANCOPEAK:2,VANCORANDOM:2,GENTTROUGH:2,GENTPEAK:2,GENTRANDOM:2,TOBRATROUGH:2,TOBRAPEAK:2,TOBRARND:2,AMIKACINPEAK:2,AMIKACINTROU:2,AMIKACIN:2, in the last 72 hours   Microbiology: No results found for this or any previous visit (from the past 720 hour(s)).  Medical History: Past Medical History  Diagnosis Date  . Hypertension   . Thyroid disease   . Hypothyroidism   . Diabetic retinopathy   . Thalassemia minor   . Charcot ankle     AND FOOT - LEFT  . Dysrhythmia     PAST HX SVT--HAD HEART ABLATION 2002--NO PROBLEMS SINCE  . Osteomyelitis of foot 1993    PT REQUIRED BELOW THE KNEE AMPUTATION   . Diabetes mellitus     PT HAS INSULIN PUMP-DR. ALTHEIMER MANAGES PT'S PUMP  . Blood transfusion   . Chronic kidney disease     RENAL INSUFFICIENCY  . GERD (gastroesophageal reflux disease)     PT WOULD TAKE PROTONIX IF HAVING GERD  . Headache     2010--DX WITH CLUSTER H/A'S -JUST OCCAS BAD HEADACHE NOW  . 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 Depression   . Anxiety   . Sleep apnea     PT STATES SLEEP STUDY ABOUT 13 YRS AGO-TOLD SHE HAD SLEEP APNEA BUT NEVER GIVEN CPAP    Medications:  Prescriptions prior to admission  Medication Sig Dispense Refill  . atorvastatin (LIPITOR) 40  MG tablet Take 40 mg by mouth at bedtime.       . carvedilol (COREG) 6.25 MG tablet Take 6.25 mg by mouth 2 (two) times daily.       . Cholecalciferol (VITAMIN D-3) 5000 UNITS TABS Take 5,000 Units by mouth daily.      . diphenhydrAMINE (BENADRYL) 25 mg capsule Take 1 capsule (25 mg total) by mouth every 6 (six) hours as needed for itching, allergies or sleep.      . diphenhydrAMINE (BENADRYL) 25 mg capsule Take 1 capsule (25 mg total) by mouth every 6 (six) hours as needed for itching, allergies or sleep.      Marland Kitchen escitalopram (LEXAPRO) 20 MG tablet Take 20 mg by mouth daily after breakfast.      . felodipine (PLENDIL) 5 MG 24 hr tablet Take 5 mg by mouth daily after breakfast.       . ferrous sulfate 325 (65 FE) MG tablet Take 1 tablet (325 mg total) by mouth 3 (three) times daily after meals.      . fexofenadine (ALLEGRA) 180 MG tablet Take 180 mg by mouth daily.      . Insulin Human (INSULIN PUMP) 100 unit/ml SOLN Inject 1 each into the skin See admin instructions. Patient is taking 2.25 units per hour.      . levothyroxine (SYNTHROID, LEVOTHROID) 125 MCG tablet Take 250 mcg by mouth daily before breakfast.      . mometasone (NASONEX) 50 MCG/ACT nasal spray Place 2 sprays into the nose daily  as needed. Allergies       . Multiple Vitamin (MULITIVITAMIN WITH MINERALS) TABS Take 1 tablet by mouth daily.      Marland Kitchen olmesartan-hydrochlorothiazide (BENICAR HCT) 40-12.5 MG per tablet Take 1 tablet by mouth at bedtime.      . Olopatadine HCl (PATANASE) 0.6 % SOLN Place 1 drop into the nose daily as needed. Allergies       . omega-3 acid ethyl esters (LOVAZA) 1 G capsule Take 2 g by mouth daily.      Marland Kitchen OVER THE COUNTER MEDICATION SALINE NASAL SPRAY -OTC--WOULD DO ONE SPRAY EACH NOSTRIL PRIOR TO USING ANY OTHER NASAL SPRAY FOR HER ALLERGIES      . pantoprazole (PROTONIX) 20 MG tablet Take 20 mg by mouth daily with breakfast. ONCE A DAY AS NEEDED--ONLY IF HAVING REFLUX-DOES NOT TAKE EVERYDAY        Assessment: [name] has a hx of DM, R BKA and presents with persistent RLE cellulitis associated with probable abscess, who failed PO antibiotics. She presents with swelling around R BKA and persistent fever. Received orders to initiate IV, broad spectrum abx with plans for I & D. No lab data available from today, but historically looks like patient has had ClCr ~50.  Goal of Therapy:  Vancomycin trough level 10-15 mcg/ml  Plan:  - Zosyn 3.375gm IV Q8h, each dose infused over 4 hours. - Vancomycin 2gm IV now, will f/up Scr to order subsequent doses - Will monitor cx/spec/sens, renal fn and clinical status daily. - Lovenox dose adjusted to 0.5 mg/kg/day for BMI > 30 Kg/m2 (53). - Will monitor CBC  Thanks, Sheelah Ritacco K. Allena Katz, PharmD, BCPS.  Clinical Pharmacist Pager [phone]. 02/01/2012 2:17 PM

## 2012-02-02 ENCOUNTER — Inpatient Hospital Stay (HOSPITAL_COMMUNITY): Payer: BC Managed Care – PPO

## 2012-02-02 ENCOUNTER — Encounter (HOSPITAL_COMMUNITY): Payer: BC Managed Care – PPO

## 2012-02-02 DIAGNOSIS — N184 Chronic kidney disease, stage 4 (severe): Secondary | ICD-10-CM | POA: Diagnosis present

## 2012-02-02 LAB — GLUCOSE, CAPILLARY
Glucose-Capillary: 163 mg/dL — ABNORMAL HIGH (ref 70–99)
Glucose-Capillary: 205 mg/dL — ABNORMAL HIGH (ref 70–99)
Glucose-Capillary: 76 mg/dL (ref 70–99)
Glucose-Capillary: 84 mg/dL (ref 70–99)

## 2012-02-02 LAB — C-REACTIVE PROTEIN: CRP: 11.1 mg/dL — ABNORMAL HIGH (ref <0.60)

## 2012-02-02 LAB — BASIC METABOLIC PANEL
BUN: 38 mg/dL — ABNORMAL HIGH (ref 6–23)
CO2: 26 mEq/L (ref 19–32)
Chloride: 101 mEq/L (ref 96–112)
GFR calc non Af Amer: 26 mL/min — ABNORMAL LOW (ref >90)
Glucose, Bld: 61 mg/dL — ABNORMAL LOW (ref 70–99)
Potassium: 3.8 mEq/L (ref 3.5–5.1)
Sodium: 139 mEq/L (ref 135–145)

## 2012-02-02 LAB — CBC
HCT: 31.7 % — ABNORMAL LOW (ref 36.0–46.0)
Hemoglobin: 10.3 g/dL — ABNORMAL LOW (ref 12.0–15.0)
MCH: 22.6 pg — ABNORMAL LOW (ref 26.0–34.0)
MCHC: 32.5 g/dL (ref 30.0–36.0)
MCV: 69.5 fL — ABNORMAL LOW (ref 78.0–100.0)
RBC: 4.56 MIL/uL (ref 3.87–5.11)

## 2012-02-02 MED ORDER — VANCOMYCIN HCL 10 G IV SOLR
2000.0000 mg | INTRAVENOUS | Status: DC
  Administered 2012-02-02 – 2012-02-03 (×2): 2000 mg via INTRAVENOUS
  Filled 2012-02-02 (×3): qty 2000

## 2012-02-02 NOTE — Progress Notes (Signed)
Subjective: She feels a little better though still having "pain over the tibia".  Sugars dropped some this am, very high yesterday.  Objective: Vital signs in last 24 hours: Temp:  [98.8 F (37.1 C)-99.3 F (37.4 C)] 98.8 F (37.1 C) (12/03 0642) Pulse Rate:  [81-88] 81  (12/03 0642) Resp:  [18] 18  (12/03 0642) BP: (138-180)/(62-77) 138/63 mmHg (12/03 0642) SpO2:  [97 %-100 %] 98 % (12/03 0642) Weight:  [149.233 kg (329 lb)] 149.233 kg (329 lb) (12/02 1400) Weight change:  Last BM Date: 02/01/12  CBG (last 3)   Basename 02/02/12 0813 02/02/12 0406 02/02/12 0033  GLUCAP 76 84 163*    Intake/Output from previous day: 12/02 0701 - 12/03 0700 In: 240 [P.O.:240] Out: 2500 [Urine:2500] Intake/Output this shift:    General appearance: alert and no distress Eyes: no scleral icterus Throat: oropharynx moist without erythema Resp: clear to auscultation bilaterally Cardio: regular rate and rhythm, S1, S2 normal, no murmur, click, rub or gallop GI: soft, non-tender; bowel sounds normal; no masses,  no organomegaly Extremities: no clubbing, cyanosis; slight decrease in warmth over right lower extremity stump; soft tissue swelling/fluctuance over stump  Lab Results:  Basename 02/02/12 0600 02/01/12 1508  NA 139 132*  K 3.8 4.2  CL 101 95*  CO2 26 24  GLUCOSE 61* 325*  BUN 38* 40*  CREATININE 2.15* 2.10*  CALCIUM 9.2 8.7  MG -- --  PHOS -- --    Basename 02/01/12 1508  AST 18  ALT 21  ALKPHOS 72  BILITOT 0.3  PROT 7.5  ALBUMIN 3.1*    Basename 02/02/12 0600  WBC 6.5  NEUTROABS --  HGB 10.3*  HCT 31.7*  MCV 69.5*  PLT 256   Lab Results  Component Value Date   INR 1.01 07/20/2011   INR 1.02 06/22/2011   INR 1.01 11/21/2010   Sed Rate 63 CRP 11.1  Studies/Results: No results found.   Medications: Scheduled:   . atorvastatin  40 mg Oral QHS  . carvedilol  6.25 mg Oral BID  . cholecalciferol  5,000 Units Oral Daily  . enoxaparin (LOVENOX) injection   70 mg Subcutaneous Q24H  . escitalopram  20 mg Oral QPC breakfast  . felodipine  5 mg Oral QPC breakfast  . ferrous sulfate  325 mg Oral TID PC  . fluticasone  1 spray Each Nare Daily  . furosemide  80 mg Oral Daily  . insulin pump   Subcutaneous Q4H  . levothyroxine  250 mcg Oral QAC breakfast  . loratadine  10 mg Oral Daily  . losartan  50 mg Oral Daily  . multivitamin with minerals  1 tablet Oral Daily  . omega-3 acid ethyl esters  2 g Oral Daily  . pantoprazole  20 mg Oral Q breakfast  . piperacillin-tazobactam (ZOSYN)  IV  3.375 g Intravenous Q8H  . sodium chloride  3 mL Intravenous Q12H  . [COMPLETED] vancomycin  2,000 mg Intravenous Once  . vancomycin  2,000 mg Intravenous Q24H  . [DISCONTINUED] Vitamin D-3  5,000 Units Oral Daily   Continuous:   Assessment/Plan: Principal Problem: 1. *Right BKA infection- high concern for abscess.  Cellulitis seems a little better.  Discussed with Dr. Charlann Boxer.  Will obtain MRI right stump for further evaluation with consideration of I&D tomorrow pending results.  Continue empiric Vanc/Zosyn.  Active Problems: 2. Diabetes mellitus type 1, uncontrolled with neurologic/renal complications- fluctuating sugars.  Basal rates increased from 2.9 to 3.0 units yesterday.  Will decrease  back to 2.9 from 12am-6am due to am hypoglycemia.   3. 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  Increases risk of ulceration and infections. 4. HYPERTENSION- BP a little high today.  Continue home regimen and monitor.  Consider increase in Carvedilol or felodipine. 5. CRI, Stage IV- monitor with ongoing antibiotic use. 6. Disposition- anticipate discharge to home in 2-3 days pending MRI results and need for surgical management, ongoing IV antibiotics.  Will likely need PICC line if prolonged antibiotics are needed.   LOS: 1 day   Shalinda Burkholder,W DOUGLAS 02/02/2012, 8:47 AM

## 2012-02-02 NOTE — Clinical Social Work Psychosocial (Signed)
     Clinical Social Work Department BRIEF PSYCHOSOCIAL ASSESSMENT 02/02/2012  Patient:  [name], [name]     Account Number:  [id number]     Admit date:  02/01/2012  Clinical Social Worker:  Hulan Fray  Date/Time:  02/02/2012 04:25 PM  Referred by:  RN  Date Referred:  02/01/2012 Referred for  Advanced Directives   Other Referral:   Interview type:  Patient Other interview type:    PSYCHOSOCIAL DATA Living Status:  ALONE Admitted from facility:   Level of care:   Primary support name:  Alyxandria Wentz Primary support relationship to patient:  SIBLING Degree of support available:   adequate    CURRENT CONCERNS Current Concerns  Financial Resources  Other - See comment   Other Concerns:   employment resources    SOCIAL WORK ASSESSMENT / PLAN Clinical Social Worker received referral for advance directive request. CSW introduced self and explained reason for visit. Patient was agreeable to CSW providing advance directive packet and MOST form. CSW explained forms inside and informed patient that documents can be notarized during hospital admission and also in the community. Patient reported that her first emergency contact will be her sister and then her mother. Patient did not have any further questions regarding advance directives.    Patient did report concerns regarding her medication and resources for assistance in job search. Patient reported that she is currently not working and was last employed at [employer]. Patient reported that she currently lives alone and is struggling to pay her rent, because she does not have any income coming in. Patient reported that she has a younger sister in [place] who wants the patient to move in with her. Per patient, she had previously lived in [place] before and had a bad experience with it and is hoping not to return back. Patient reported that her goal, if she was moving, is to return back to [place], [place], but financially she cannot  do so. Patient reported feeling like her sister or mother are not making efforts to assist her in moving back to [place]. Patient reported that if she had to stay in [place] with her sister because she cannot find a job here, she will do it. Patient stated, "I don't really have a choice in being picky." CSW provided patient with resources from [employer] and their emergency assistance program and a [employer]. Patient was appreciative of CSW's assistance. CSW will sign of, as social work intervention is no longer needed.   Assessment/plan status:  Information/Referral to [facility] Other assessment/ plan:   Information/referral to community resources:   Advance directive packet  MOST form  Teacher, adult education [employer] List    PATIENTS/FAMILYS RESPONSE TO PLAN OF CARE: Patient was appreciative of CSW's visit and assistance with resources provided for advance directives, assitance with bills and job search.

## 2012-02-02 NOTE — Progress Notes (Signed)
Inpatient Diabetes Program Recommendations  AACE/ADA: New Consensus Statement on Inpatient Glycemic Control (2013)  Target Ranges:  Prepandial:   less than 140 mg/dL      Peak postprandial:   less than 180 mg/dL (1-2 hours)      Critically ill patients:  140 - 180 mg/dL   Reason for Visit: Insulin pump management in the hospital Spoke with patient for quite a while regarding all she is going through as a result of her diabetes. Pt has  recently lost her job with [employer] and now her unemployment has run out. Pt is concerned about her health insurance, paying her rent, and having no income. I will make sure she has a case management referral and that the financial counselor can see her as well. Inspected her insulin pump which she changed out her insertion site and infusion site last evening. Site is now in her left upper quadrant of abdoment. Basal rates are as follows (Dr Clelia Croft instructed her to decrease her basal rate from 3.0 units per hour to 2.9 units per hour only between 12 midnight and 6 am since pt has a glucose of 61 mg/dL this am). From 6am until 12 midnight, her basal rates remain at 3.0 units per hour. Total basal insulin per 24 hrs now at 71.4 units. Her bolus set up is as follows: insulin:CHO ratio is 1 unit per 4 grams carb (24 hours a day) Sensitivity factor is set at 14 (1 unit lowers her glucose 14 mg/dL).  Her target glucose is set at 110-110 mg/dL. Pt completely able to operate pump and wishes to continue. Will check on patient again tomorrow.  Note: Thank you, Lenor Coffin, RN, CNS, Diabetes Coordinator [phone])

## 2012-02-02 NOTE — Consult Note (Signed)
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 Charlann Boxer.  [name] is an 46 y.o. female.  HPI: Patient presents with swelling and fever around right BKA since Saturday. Dr. Waynard Edwards called her in Doxy 100mg  bid and she started this Saturday. Fever continued yesterday to 100.1. Presents to the ER for possible abscess / infection, her symptoms are similar to previous infections and went tot he hospital for further evaluation. Previous I&D of right stump in April and May this year was on IV abx at home for 6 weeks into June by Dr. Charlann Boxer. Entire body hurts. Blood sugars have been ranging in the 300s since this started. Again another common symptoms for her that corollate with her and infection.   Past Medical History  Diagnosis Date  . Hypertension   . Hypothyroidism   . Diabetic retinopathy(362.0)   . Charcot ankle     AND FOOT - LEFT  . Osteomyelitis of foot 1993    PT REQUIRED BELOW THE KNEE AMPUTATION   . Blood transfusion     "multiple" (02/01/2012)  . GERD (gastroesophageal reflux disease)     PT WOULD TAKE PROTONIX IF HAVING GERD  . Depression   . Anxiety   . Sleep apnea     PT STATES SLEEP STUDY ABOUT 13 YRS AGO-TOLD SHE HAD SLEEP APNEA BUT NEVER GIVEN CPAP  . Hypercholesteremia   . SVT (supraventricular tachycardia) 2002    PAST HX SVT--HAD HEART ABLATION 2002--NO PROBLEMS SINCE  . Exertional dyspnea     "lately" (02/01/2012)  . Hashimoto thyroiditis, fibrous variant 1997  . Type 1 diabetes     PT HAS INSULIN PUMP-DR. ALTHEIMER MANAGES PT'S PUMP  . Thalassemia minor   . Iron deficiency anemia   . Headache     2010--DX WITH CLUSTER H/A'S -JUST OCCAS BAD HEADACHE NOW  . Sinus headache   . Migraine     "q couple years" (02/01/2012)  . DJD (degenerative joint disease) of hip     HX OF DJD LEFT HIP (S/P LEFT TOTAL HIP ARTHROPLASTY )AND ARTHRITIS LEFT ANKLE  . Arthritis     "shoulders; left  ankle, foot, knee" (02/01/2012)  . Chronic kidney disease, stage 2 (mild)     Past Surgical History  Procedure Date  . Below knee leg amputation 1993    RIGHT  . Thyroidectomy 1997  . Retinal detachment repair w/ scleral buckle le 2004    "left" (02/01/2012)  . Transmetatarsal amputation left foot 1998  . Breast lumpectomy 2000    RIGHT BREAST LUMPECTOMY-BENIGN  . Cataract extraction w/ intraocular lens  implant, bilateral 2005-2007  . Joint replacement 2011    "left hip" (02/01/2012)  . Carpal tunnel release 2003    "right" (02/01/2012)  . Irrigation and debridement knee 06/23/2011    Procedure: IRRIGATION AND DEBRIDEMENT KNEE;  Surgeon: Shelda Pal, MD;  Location: [facility];  Service: Orthopedics;  Laterality: Right;  Irrigation and Debridement of Right Knee Abscess   . Refractive surgery 10/2010    retinal hemorrhaging  . I&d extremity 07/20/2011    Procedure: IRRIGATION AND DEBRIDEMENT EXTREMITY;  Surgeon: Shelda Pal, MD;  Location: [facility];  Service: Orthopedics;  Laterality: Right;  I&D of a Right Leg  . Total hip arthroplasty 2011    "left" (02/01/2012)  . Supraventricular tachycardia ablation 2002  . Cervical polypectomy 2012    History reviewed. No pertinent family history.  Social History:  reports that she has never smoked. She has never used smokeless tobacco. She reports that she drinks alcohol. She reports that she does not use illicit drugs.  Allergies:  Allergies  Allergen Reactions  . Fish Allergy Anaphylaxis and Itching    "I can eat tuna, shrimp, salmon; can't eat anything else" (02/01/2012)  . Travatan (Travoprost) Itching  . Other Itching    Grapes "my throat itches" (02/01/2012)     Results for orders placed during the hospital encounter of 02/01/12 (from the past 48 hour(s))  GLUCOSE, CAPILLARY     Status: Abnormal   Collection Time   02/01/12  2:38 PM      Component Value Range Comment   Glucose-Capillary 316 (*) 70 - 99 mg/dL    Comment 1 Notify  RN     COMPREHENSIVE METABOLIC PANEL     Status: Abnormal   Collection Time   02/01/12  3:08 PM      Component Value Range Comment   Sodium 132 (*) 135 - 145 mEq/L    Potassium 4.2  3.5 - 5.1 mEq/L    Chloride 95 (*) 96 - 112 mEq/L    CO2 24  19 - 32 mEq/L    Glucose, Bld 325 (*) 70 - 99 mg/dL    BUN 40 (*) 6 - 23 mg/dL    Creatinine, Ser 0.98 (*) 0.50 - 1.10 mg/dL    Calcium 8.7  8.4 - 11.9 mg/dL    Total Protein 7.5  6.0 - 8.3 g/dL    Albumin 3.1 (*) 3.5 - 5.2 g/dL    AST 18  0 - 37 U/L    ALT 21  0 - 35 U/L    Alkaline Phosphatase 72  39 - 117 U/L    Total Bilirubin 0.3  0.3 - 1.2 mg/dL    GFR calc non Af Amer 27 (*) >90 mL/min    GFR calc Af Amer 31 (*) >90 mL/min   GLUCOSE, CAPILLARY     Status: Abnormal   Collection Time   02/01/12  5:22 PM      Component Value Range Comment   Glucose-Capillary 320 (*) 70 - 99 mg/dL    Comment 1 Notify RN     KETONES, QUALITATIVE     Status: Normal   Collection Time   02/01/12  6:14 PM      Component Value Range Comment   Acetone, Bld NEGATIVE  NEGATIVE   GLUCOSE, CAPILLARY     Status: Abnormal   Collection Time   02/01/12  7:02 PM      Component Value Range Comment   Glucose-Capillary 302 (*) 70 - 99 mg/dL    Comment 1 Notify RN     C-REACTIVE PROTEIN     Status: Abnormal   Collection Time   02/01/12  7:51 PM      Component Value Range Comment   CRP 11.1 (*) <0.60 mg/dL   SEDIMENTATION RATE     Status: Abnormal   Collection Time   02/01/12  7:51 PM      Component Value Range Comment   Sed Rate 63 (*) 0 - 22 mm/hr   GLUCOSE, CAPILLARY     Status: Abnormal   Collection Time   02/01/12  7:55 PM      Component Value Range Comment   Glucose-Capillary 308 (*) 70 - 99 mg/dL    Comment 1 Notify RN     GLUCOSE, CAPILLARY     Status: Abnormal  Collection Time   02/01/12  9:31 PM      Component Value Range Comment   Glucose-Capillary 253 (*) 70 - 99 mg/dL   GLUCOSE, CAPILLARY     Status: Abnormal   Collection Time   02/02/12 12:33 AM        Component Value Range Comment   Glucose-Capillary 163 (*) 70 - 99 mg/dL   GLUCOSE, CAPILLARY     Status: Normal   Collection Time   02/02/12  4:06 AM      Component Value Range Comment   Glucose-Capillary 84  70 - 99 mg/dL   BASIC METABOLIC PANEL     Status: Abnormal   Collection Time   02/02/12  6:00 AM      Component Value Range Comment   Sodium 139  135 - 145 mEq/L    Potassium 3.8  3.5 - 5.1 mEq/L    Chloride 101  96 - 112 mEq/L    CO2 26  19 - 32 mEq/L    Glucose, Bld 61 (*) 70 - 99 mg/dL    BUN 38 (*) 6 - 23 mg/dL    Creatinine, Ser 1.61 (*) 0.50 - 1.10 mg/dL    Calcium 9.2  8.4 - 09.6 mg/dL    GFR calc non Af Amer 26 (*) >90 mL/min    GFR calc Af Amer 30 (*) >90 mL/min   CBC     Status: Abnormal   Collection Time   02/02/12  6:00 AM      Component Value Range Comment   WBC 6.5  4.0 - 10.5 K/uL    RBC 4.56  3.87 - 5.11 MIL/uL    Hemoglobin 10.3 (*) 12.0 - 15.0 g/dL    HCT 04.5 (*) 40.9 - 46.0 %    MCV 69.5 (*) 78.0 - 100.0 fL    MCH 22.6 (*) 26.0 - 34.0 pg    MCHC 32.5  30.0 - 36.0 g/dL    RDW 81.1  91.4 - 78.2 %    Platelets 256  150 - 400 K/uL   GLUCOSE, CAPILLARY     Status: Normal   Collection Time   02/02/12  8:13 AM      Component Value Range Comment   Glucose-Capillary 76  70 - 99 mg/dL    Comment 1 Notify RN        Review of Systems  Constitutional: Positive for fever. Negative for chills and weight loss.  HENT: Negative.   Eyes: Negative.   Respiratory: Negative.   Cardiovascular: Negative.   Gastrointestinal: Negative.   Genitourinary: Negative.   Musculoskeletal: Positive for joint pain (right stump / over tibia).  Skin: Negative.   Neurological: Negative.   Endo/Heme/Allergies: Negative.   Psychiatric/Behavioral: Negative.    Blood pressure 138/63, pulse 81, temperature 98.8 F (37.1 C), temperature source Oral, resp. rate 18, height 6\' 2"  (1.88 m), weight 149.233 kg (329 lb), SpO2 98.00%. Physical Exam  Constitutional: She is oriented  to person, place, and time. She appears well-developed and well-nourished.  HENT:  Head: Normocephalic and atraumatic.  Mouth/Throat: Oropharynx is clear and moist.  Eyes: Pupils are equal, round, and reactive to light.  Neck: Neck supple. No JVD present. No tracheal deviation present.  Cardiovascular: Normal rate.   Respiratory: Effort normal and breath sounds normal. No respiratory distress. She has no wheezes.  GI: Soft. There is no tenderness. There is no guarding.  Musculoskeletal:       Right knee: She exhibits swelling.  Legs: Lymphadenopathy:    She has no cervical adenopathy.  Neurological: She is alert and oriented to person, place, and time.  Skin: Skin is warm and dry.  Psychiatric: She has a normal mood and affect.    Assessment/Plan: Infection of the right stump. Need to rule out abscess.  Patient was seen with Dr. Charlann Boxer  Dr. Charlann Boxer has discussed the case with Dr. Clelia Croft At this time it is agreed to continue empiric Vanc/Zosyn. Will obtain MRI right stump for further evaluation with consideration of I&D tomorrow pending results.     Gerrit Halls 02/02/2012, 10:53 AM

## 2012-02-03 LAB — BASIC METABOLIC PANEL
BUN: 36 mg/dL — ABNORMAL HIGH (ref 6–23)
Calcium: 9 mg/dL (ref 8.4–10.5)
GFR calc Af Amer: 30 mL/min — ABNORMAL LOW (ref >90)
GFR calc non Af Amer: 26 mL/min — ABNORMAL LOW (ref >90)
Potassium: 3.6 mEq/L (ref 3.5–5.1)
Sodium: 137 mEq/L (ref 135–145)

## 2012-02-03 LAB — GLUCOSE, CAPILLARY
Glucose-Capillary: 100 mg/dL — ABNORMAL HIGH (ref 70–99)
Glucose-Capillary: 93 mg/dL (ref 70–99)
Glucose-Capillary: 169 mg/dL — ABNORMAL HIGH (ref 70–99)
Glucose-Capillary: 96 mg/dL (ref 70–99)
Glucose-Capillary: 103 mg/dL — ABNORMAL HIGH (ref 70–99)

## 2012-02-03 LAB — CBC
MCHC: 32.3 g/dL (ref 30.0–36.0)
RDW: 14.2 % (ref 11.5–15.5)

## 2012-02-03 MED ORDER — ENOXAPARIN SODIUM 80 MG/0.8ML ~~LOC~~ SOLN
70.0000 mg | SUBCUTANEOUS | Status: DC
  Administered 2012-02-03: 70 mg via SUBCUTANEOUS
  Filled 2012-02-03 (×2): qty 0.8

## 2012-02-03 MED ORDER — ENOXAPARIN SODIUM 80 MG/0.8ML ~~LOC~~ SOLN: 70.0000 mg | SUBCUTANEOUS | Status: DC

## 2012-02-03 MED ORDER — ASPIRIN EC 81 MG PO TBEC
81.0000 mg | DELAYED_RELEASE_TABLET | Freq: Every day | ORAL | Status: DC
  Filled 2012-02-03: qty 1

## 2012-02-03 NOTE — Progress Notes (Signed)
02/03/12 Called financial counselor, Frances Maywood to please see patient regarding financial concerns now that she has no income and could qualify for disabiity (has a right below the knee amputation). Case manager consult ordered. Thank you, Lenor Coffin, RN, CNS, Diabetes Coordinator [phone])

## 2012-02-03 NOTE — Progress Notes (Signed)
Patient ID: [name], female   DOB: [birth year], 46 y.o.   MRN: [id number] Doing fine, no major complaints Was hoping to go to OR tonight for I&D of right leg based on MRI results but will now plan to go tomorrow  AFVSS, on IV antibiotics   MRI OF THE RIGHT KNEE WITHOUT CONTRAST  Technique: Multiplanar, multisequence MR imaging of the right knee  was performed. No intravenous contrast was administered.  Comparison: Radiographs dated 05/04/2011  FINDINGS: There is a prominent soft tissue fluid collection  measuring 8.7 x 5.8 x 2.9 cm lying anterior to the stump of the  tibia and extending medially. There is extensive edema in the  adjacent soft tissues. The findings are consistent with an abscess  and cellulitis. An inflamed pseudo bursa could give the same  appearance.  There is no underlying myositis or osteomyelitis. There is no knee  joint effusion. There is focal grade 4 chondromalacia of the  medial central portion of the lateral tibial plateau.  Cruciate and collateral ligaments are normal. Patellar tendon is  normal. Menisci are intact.  IMPRESSION:  Large subcutaneous fluid collection at the anterior medial aspect  of the distal stump consistent with an abscess with adjacent  Cellulitis.  Right BKA stump recurrent abscess with associated cellulitis Plan to go to OR tomorrow pm for incision and irrigational debridement of right leg abscess NPO after 9am, with light breakfast Consent on chart

## 2012-02-03 NOTE — Progress Notes (Signed)
Subjective: Pt has no complaints this AM. Waiting to speak w/ care coordinator about helping w/ her current financial situation.   Objective: Vital signs in last 24 hours: Temp:  [98.3 F (36.8 C)-99 F (37.2 C)] 98.4 F (36.9 C) (12/04 0512) Pulse Rate:  [71-88] 71  (12/04 0512) Resp:  [16-18] 16  (12/04 0512) BP: (128-132)/(64-69) 128/68 mmHg (12/04 0512) SpO2:  [97 %-99 %] 99 % (12/04 0512) Weight change:  Last BM Date: 02/02/12  CBG (last 3)   Basename 02/03/12 0418 02/03/12 02/02/12 2011  GLUCAP 100* 108* 232*    Intake/Output from previous day: 12/03 0701 - 12/04 0700 In: 1360 [P.O.:1060; IV Piggyback:300] Out: 1350 [Urine:1350] Intake/Output this shift:    General appearance: alert and no distress Eyes: no scleral icterus Throat: oropharynx moist without erythema Resp: clear to auscultation bilaterally Cardio: regular rate and rhythm, S1, S2 normal, no murmur, click, rub or gallop GI: soft, non-tender; bowel sounds normal; no masses,  no organomegaly Extremities: no clubbing, cyanosis; slight decrease in warmth over right lower extremity stump; soft tissue swelling/fluctuance over stump  Lab Results:  Basename 02/03/12 0512 02/02/12 0600  NA 137 139  K 3.6 3.8  CL 100 101  CO2 26 26  GLUCOSE 95 61*  BUN 36* 38*  CREATININE 2.17* 2.15*  CALCIUM 9.0 9.2  MG -- --  PHOS -- --    Basename 02/01/12 1508  AST 18  ALT 21  ALKPHOS 72  BILITOT 0.3  PROT 7.5  ALBUMIN 3.1*    Basename 02/03/12 0512 02/02/12 0600  WBC 6.5 6.5  NEUTROABS -- --  HGB 10.0* 10.3*  HCT 31.0* 31.7*  MCV 69.4* 69.5*  PLT 268 256   Lab Results  Component Value Date   INR 1.01 07/20/2011   INR 1.02 06/22/2011   INR 1.01 11/21/2010   Sed Rate 63 CRP 11.1  Studies/Results: Mr Knee Right Wo Contrast  02/02/2012  *RADIOLOGY REPORT*  Clinical Data:  Swelling of the stump of the right BK amputation. Fever.  MRI OF THE RIGHT KNEE WITHOUT CONTRAST  Technique:  Multiplanar,  multisequence MR imaging of the right knee was performed.  No intravenous contrast was administered.  Comparison:  Radiographs dated 05/04/2011  FINDINGS: There is a prominent soft tissue fluid collection measuring 8.7 x 5.8 x 2.9 cm lying anterior to the stump of the tibia and extending medially.  There is extensive edema in the adjacent soft tissues.  The findings are consistent with an abscess and cellulitis.  An inflamed pseudo bursa could give the same appearance.  There is no underlying myositis or osteomyelitis.  There is no knee joint effusion.  There is focal grade 4 chondromalacia of the medial central portion of the lateral tibial plateau.  Cruciate and collateral ligaments are normal.  Patellar tendon is normal. Menisci are intact.  IMPRESSION: Large subcutaneous fluid collection at the anterior medial aspect of the distal stump consistent with an abscess with adjacent cellulitis.   Original Report Authenticated By: Francene Boyers, M.D.      Medications: Scheduled:    . atorvastatin  40 mg Oral QHS  . carvedilol  6.25 mg Oral BID  . cholecalciferol  5,000 Units Oral Daily  . enoxaparin (LOVENOX) injection  70 mg Subcutaneous Q24H  . escitalopram  20 mg Oral QPC breakfast  . felodipine  5 mg Oral QPC breakfast  . ferrous sulfate  325 mg Oral TID PC  . fluticasone  1 spray Each Nare Daily  .  furosemide  80 mg Oral Daily  . insulin pump   Subcutaneous Q4H  . levothyroxine  250 mcg Oral QAC breakfast  . loratadine  10 mg Oral Daily  . losartan  50 mg Oral Daily  . multivitamin with minerals  1 tablet Oral Daily  . omega-3 acid ethyl esters  2 g Oral Daily  . pantoprazole  20 mg Oral Q breakfast  . piperacillin-tazobactam (ZOSYN)  IV  3.375 g Intravenous Q8H  . sodium chloride  3 mL Intravenous Q12H  . vancomycin  2,000 mg Intravenous Q24H  . [DISCONTINUED] vancomycin  2,000 mg Intravenous Q24H   Continuous:   Assessment/Plan: Principal Problem: 1. *Right BKA infection- MRI has  confirmed abscess w/ adjacent cellulitis at the anterior medial aspect of the distal stump on RLE. WBC remains stable and pt remains afebrile.  Continue empiric Vanc/Zosyn that is being managed by pharmacy. Currently vanc 2gm daily and zosyn 3.375 q8 hours. Awaiting ortho recs on possible I&D.   Active Problems: 2. Diabetes mellitus type 1, uncontrolled with neurologic/renal complications- fluctuating sugars.  Basal rates increased from 2.9 to 3.0 units during this hospitalization, but she suffered from low AM sugars, so decreased basal back to 2.9 from 12am-6am. FSBS are now running in the 100s w/o lows. Diabetic coordinator also on board.  3. 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  Increases risk of ulceration and infections. 4. HYPERTENSION- BP stable over past 24 hours.  Continue home regimen and monitor.  Hold on increasign Carvedilol or felodipine. 5. CRI, Stage IV- monitor with ongoing antibiotic use. GFR stable at 30 6. Disposition- anticipate discharge to home in 2-3 days pending  surgical management of abscess, ongoing IV antibiotics.  Will likely need PICC line if prolonged antibiotics are needed.   LOS: 2 days   Kamryn Gauthier 02/03/2012, 7:52 AM

## 2012-02-03 NOTE — Progress Notes (Signed)
UR completed.   Provided pt with Rx discount cards to use for discharge medications.

## 2012-02-04 ENCOUNTER — Inpatient Hospital Stay (HOSPITAL_COMMUNITY): Payer: BC Managed Care – PPO | Admitting: Anesthesiology

## 2012-02-04 ENCOUNTER — Encounter (HOSPITAL_COMMUNITY): Payer: Self-pay | Admitting: Anesthesiology

## 2012-02-04 ENCOUNTER — Encounter (HOSPITAL_COMMUNITY)
Admission: AD | Disposition: A | Discharge status: Home Health | Payer: Self-pay | Source: Ambulatory Visit | Attending: Internal Medicine

## 2012-02-04 HISTORY — PX: I&D EXTREMITY: SHX5045

## 2012-02-04 LAB — CBC
HCT: 31.2 % — ABNORMAL LOW (ref 36.0–46.0)
MCV: 69.5 fL — ABNORMAL LOW (ref 78.0–100.0)
Platelets: 263 10*3/uL (ref 150–400)
RBC: 4.49 MIL/uL (ref 3.87–5.11)
WBC: 6.1 10*3/uL (ref 4.0–10.5)

## 2012-02-04 LAB — BASIC METABOLIC PANEL
BUN: 34 mg/dL — ABNORMAL HIGH (ref 6–23)
CO2: 25 mEq/L (ref 19–32)
Chloride: 101 mEq/L (ref 96–112)
Creatinine, Ser: 2.28 mg/dL — ABNORMAL HIGH (ref 0.50–1.10)
GFR calc Af Amer: 28 mL/min — ABNORMAL LOW (ref >90)
Potassium: 3.8 mEq/L (ref 3.5–5.1)

## 2012-02-04 LAB — VANCOMYCIN, TROUGH: Vancomycin Tr: 22.4 ug/mL — ABNORMAL HIGH (ref 10.0–20.0)

## 2012-02-04 LAB — GLUCOSE, CAPILLARY
Glucose-Capillary: 164 mg/dL — ABNORMAL HIGH (ref 70–99)
Glucose-Capillary: 170 mg/dL — ABNORMAL HIGH (ref 70–99)
Glucose-Capillary: 72 mg/dL (ref 70–99)
Glucose-Capillary: 91 mg/dL (ref 70–99)

## 2012-02-04 SURGERY — IRRIGATION AND DEBRIDEMENT EXTREMITY
Anesthesia: General | Site: Leg Upper | Laterality: Right | Wound class: Dirty or Infected

## 2012-02-04 MED ORDER — ASPIRIN EC 81 MG PO TBEC
81.0000 mg | DELAYED_RELEASE_TABLET | Freq: Every day | ORAL | Status: DC
  Administered 2012-02-05 – 2012-02-09 (×5): 81 mg via ORAL
  Filled 2012-02-04 (×5): qty 1

## 2012-02-04 MED ORDER — ONDANSETRON HCL 4 MG/2ML IJ SOLN: 4.0000 mg | Freq: Once | INTRAMUSCULAR | Status: DC | PRN

## 2012-02-04 MED ORDER — LACTATED RINGERS IV SOLN
INTRAVENOUS | Status: DC | PRN
  Administered 2012-02-04 (×2): via INTRAVENOUS

## 2012-02-04 MED ORDER — FUROSEMIDE 80 MG PO TABS
80.0000 mg | ORAL_TABLET | Freq: Two times a day (BID) | ORAL | Status: DC
  Administered 2012-02-04 – 2012-02-07 (×6): 80 mg via ORAL
  Filled 2012-02-04 (×8): qty 1

## 2012-02-04 MED ORDER — 0.9 % SODIUM CHLORIDE (POUR BTL) OPTIME
TOPICAL | Status: DC | PRN
  Administered 2012-02-04: 1000 mL

## 2012-02-04 MED ORDER — EPHEDRINE SULFATE 50 MG/ML IJ SOLN
INTRAMUSCULAR | Status: DC | PRN
  Administered 2012-02-04: 15 mg via INTRAVENOUS
  Administered 2012-02-04: 10 mg via INTRAVENOUS

## 2012-02-04 MED ORDER — LIDOCAINE HCL (CARDIAC) 20 MG/ML IV SOLN
INTRAVENOUS | Status: DC | PRN
  Administered 2012-02-04: 100 mg via INTRAVENOUS

## 2012-02-04 MED ORDER — SODIUM CHLORIDE 0.9 % IV SOLN
1500.0000 mg | INTRAVENOUS | Status: DC
  Administered 2012-02-04 – 2012-02-07 (×4): 1500 mg via INTRAVENOUS
  Filled 2012-02-04 (×4): qty 1500

## 2012-02-04 MED ORDER — PROPOFOL 10 MG/ML IV BOLUS
INTRAVENOUS | Status: DC | PRN
  Administered 2012-02-04: 200 mg via INTRAVENOUS

## 2012-02-04 MED ORDER — HYDROMORPHONE HCL PF 1 MG/ML IJ SOLN
0.5000 mg | INTRAMUSCULAR | Status: DC | PRN
  Administered 2012-02-04 – 2012-02-05 (×2): 1 mg via INTRAVENOUS
  Filled 2012-02-04 (×2): qty 1

## 2012-02-04 MED ORDER — HYDROMORPHONE HCL PF 1 MG/ML IJ SOLN: 0.2500 mg | INTRAMUSCULAR | Status: DC | PRN

## 2012-02-04 MED ORDER — SUCCINYLCHOLINE CHLORIDE 20 MG/ML IJ SOLN
INTRAMUSCULAR | Status: DC | PRN
  Administered 2012-02-04: 100 mg via INTRAVENOUS

## 2012-02-04 MED ORDER — SODIUM CHLORIDE 0.9 % IR SOLN
Status: DC | PRN
  Administered 2012-02-04: 3000 mL

## 2012-02-04 MED ORDER — ONDANSETRON HCL 4 MG/2ML IJ SOLN
INTRAMUSCULAR | Status: DC | PRN
  Administered 2012-02-04: 4 mg via INTRAVENOUS

## 2012-02-04 MED ORDER — LIDOCAINE HCL 4 % MT SOLN
OROMUCOSAL | Status: DC | PRN
  Administered 2012-02-04: 4 mL via TOPICAL

## 2012-02-04 MED ORDER — ENOXAPARIN SODIUM 80 MG/0.8ML ~~LOC~~ SOLN
70.0000 mg | SUBCUTANEOUS | Status: DC
  Administered 2012-02-05 – 2012-02-08 (×4): 70 mg via SUBCUTANEOUS
  Filled 2012-02-04 (×5): qty 0.8

## 2012-02-04 MED ORDER — FENTANYL CITRATE 0.05 MG/ML IJ SOLN
INTRAMUSCULAR | Status: DC | PRN
  Administered 2012-02-04 (×2): 100 ug via INTRAVENOUS

## 2012-02-04 SURGICAL SUPPLY — 47 items
BANDAGE ELASTIC 4 VELCRO ST LF (GAUZE/BANDAGES/DRESSINGS) ×2 IMPLANT
BANDAGE ELASTIC 6 VELCRO ST LF (GAUZE/BANDAGES/DRESSINGS) ×2 IMPLANT
BANDAGE GAUZE ELAST BULKY 4 IN (GAUZE/BANDAGES/DRESSINGS) ×4 IMPLANT
CLOTH BEACON ORANGE TIMEOUT ST (SAFETY) ×2 IMPLANT
CUFF TOURNIQUET SINGLE 18IN (TOURNIQUET CUFF) ×2 IMPLANT
CUFF TOURNIQUET SINGLE 24IN (TOURNIQUET CUFF) IMPLANT
CUFF TOURNIQUET SINGLE 34IN LL (TOURNIQUET CUFF) IMPLANT
CUFF TOURNIQUET SINGLE 44IN (TOURNIQUET CUFF) IMPLANT
DRAPE SURG 17X23 STRL (DRAPES) ×2 IMPLANT
DRAPE U-SHAPE 47X51 STRL (DRAPES) ×2 IMPLANT
DRSG EMULSION OIL 3X3 NADH (GAUZE/BANDAGES/DRESSINGS) ×2 IMPLANT
DRSG PAD ABDOMINAL 8X10 ST (GAUZE/BANDAGES/DRESSINGS) ×6 IMPLANT
ELECT REM PT RETURN 9FT ADLT (ELECTROSURGICAL)
ELECTRODE REM PT RTRN 9FT ADLT (ELECTROSURGICAL) IMPLANT
EVACUATOR 1/8 PVC DRAIN (DRAIN) ×2 IMPLANT
FACESHIELD LNG OPTICON STERILE (SAFETY) ×2 IMPLANT
GAUZE XEROFORM 5X9 LF (GAUZE/BANDAGES/DRESSINGS) ×2 IMPLANT
GLOVE BIOGEL PI IND STRL 7.5 (GLOVE) ×1 IMPLANT
GLOVE BIOGEL PI IND STRL 8 (GLOVE) ×1 IMPLANT
GLOVE BIOGEL PI INDICATOR 7.5 (GLOVE) ×1
GLOVE BIOGEL PI INDICATOR 8 (GLOVE) ×1
GLOVE ORTHO TXT STRL SZ7.5 (GLOVE) ×4 IMPLANT
GLOVE SURG ORTHO 8.0 STRL STRW (GLOVE) ×2 IMPLANT
GOWN STRL NON-REIN LRG LVL3 (GOWN DISPOSABLE) ×6 IMPLANT
HANDPIECE INTERPULSE COAX TIP (DISPOSABLE)
KIT BASIN OR (CUSTOM PROCEDURE TRAY) ×2 IMPLANT
KIT ROOM TURNOVER OR (KITS) ×2 IMPLANT
MANIFOLD NEPTUNE II (INSTRUMENTS) ×2 IMPLANT
NS IRRIG 1000ML POUR BTL (IV SOLUTION) ×2 IMPLANT
PACK ORTHO EXTREMITY (CUSTOM PROCEDURE TRAY) ×2 IMPLANT
PAD ARMBOARD 7.5X6 YLW CONV (MISCELLANEOUS) ×4 IMPLANT
SET HNDPC FAN SPRY TIP SCT (DISPOSABLE) IMPLANT
SPONGE GAUZE 4X4 12PLY (GAUZE/BANDAGES/DRESSINGS) ×4 IMPLANT
SPONGE LAP 18X18 X RAY DECT (DISPOSABLE) ×2 IMPLANT
SPONGE LAP 4X18 X RAY DECT (DISPOSABLE) ×2 IMPLANT
STOCKINETTE IMPERVIOUS 9X36 MD (GAUZE/BANDAGES/DRESSINGS) ×2 IMPLANT
SUCTION FRAZIER TIP 10 FR DISP (SUCTIONS) IMPLANT
SUT ETHILON 2 0 FS 18 (SUTURE) ×4 IMPLANT
SUT ETHILON 3 0 PS 1 (SUTURE) ×2 IMPLANT
SUT SILK 2 0 FS (SUTURE) ×2 IMPLANT
TOWEL OR 17X24 6PK STRL BLUE (TOWEL DISPOSABLE) ×2 IMPLANT
TOWEL OR 17X26 10 PK STRL BLUE (TOWEL DISPOSABLE) ×2 IMPLANT
TUBE ANAEROBIC SPECIMEN COL (MISCELLANEOUS) ×2 IMPLANT
TUBE CONNECTING 12X1/4 (SUCTIONS) ×2 IMPLANT
UNDERPAD 30X30 INCONTINENT (UNDERPADS AND DIAPERS) ×2 IMPLANT
WATER STERILE IRR 1000ML POUR (IV SOLUTION) ×2 IMPLANT
YANKAUER SUCT BULB TIP NO VENT (SUCTIONS) ×2 IMPLANT

## 2012-02-04 NOTE — Anesthesia Procedure Notes (Addendum)
Procedure Name: Intubation Date/Time: 02/04/2012 7:39 PM Performed by: Garen Lah Pre-anesthesia Checklist: Patient identified, Timeout performed, Emergency Drugs available, Suction available and Patient being monitored Patient Re-evaluated:Patient Re-evaluated prior to inductionOxygen Delivery Method: Circle system utilized Preoxygenation: Pre-oxygenation with 100% oxygen Intubation Type: IV induction Ventilation: Mask ventilation without difficulty Laryngoscope Size: Mac and 3 Grade View: Grade II Tube type: Oral Tube size: 8.0 mm Number of attempts: 1 Airway Equipment and Method: Stylet Placement Confirmation: ETT inserted through vocal cords under direct vision,  positive ETCO2 and breath sounds checked- equal and bilateral Secured at: 23 cm Tube secured with: Tape Dental Injury: Teeth and Oropharynx as per pre-operative assessment    Date/Time: 02/04/2012 7:39 PM Performed by: Para March L Airway Equipment and Method: LTA kit utilized

## 2012-02-04 NOTE — Anesthesia Postprocedure Evaluation (Signed)
  Anesthesia Post-op Note  Patient: [name]  Procedure(s) Performed: Procedure(s) (LRB) with comments: IRRIGATION AND DEBRIDEMENT EXTREMITY (Right)  Patient Location: PACU  Anesthesia Type:General  Level of Consciousness: awake, alert , oriented and patient cooperative  Airway and Oxygen Therapy: Patient Spontanous Breathing  Post-op Pain: mild  Post-op Assessment: Post-op Vital signs reviewed, Patient's Cardiovascular Status Stable, Respiratory Function Stable, Patent Airway, No signs of Nausea or vomiting and Pain level controlled  Post-op Vital Signs: stable  Complications: No apparent anesthesia complications

## 2012-02-04 NOTE — Preoperative (Signed)
Beta Blockers   Reason not to administer Beta Blockers:Not Applicable. Coreg 02/04/12 am

## 2012-02-04 NOTE — Progress Notes (Signed)
Subjective: Did have low FSBS to 50s overnight but responded to crackers and she was euglycemic otherwise. No other complaints   Objective: Vital signs in last 24 hours: Temp:  [98.1 F (36.7 C)-98.6 F (37 C)] 98.5 F (36.9 C) (12/05 0426) Pulse Rate:  [75-81] 77  (12/05 0426) Resp:  [18] 18  (12/05 0426) BP: (112-135)/(44-65) 135/65 mmHg (12/04 2204) SpO2:  [92 %-96 %] 96 % (12/05 0426) Weight change:  Last BM Date: 02/02/12  CBG (last 3)   Basename 02/04/12 0405 02/04/12 0006 02/03/12 2240  GLUCAP 170* 170* 103*    Intake/Output from previous day:   Intake/Output this shift:    General appearance: alert and no distress Eyes: no scleral icterus Throat: oropharynx moist without erythema Resp: clear to auscultation bilaterally Cardio: regular rate and rhythm, S1, S2 normal, no murmur, click, rub or gallop GI: soft, non-tender; bowel sounds normal; no masses,  no organomegaly Extremities: no clubbing, cyanosis; slight decrease in warmth over right lower extremity stump; soft tissue swelling/fluctuance over stump  Lab Results:  Basename 02/03/12 0512 02/02/12 0600  NA 137 139  K 3.6 3.8  CL 100 101  CO2 26 26  GLUCOSE 95 61*  BUN 36* 38*  CREATININE 2.17* 2.15*  CALCIUM 9.0 9.2  MG -- --  PHOS -- --    Basename 02/01/12 1508  AST 18  ALT 21  ALKPHOS 72  BILITOT 0.3  PROT 7.5  ALBUMIN 3.1*    Basename 02/04/12 0520 02/03/12 0512  WBC 6.1 6.5  NEUTROABS -- --  HGB 9.9* 10.0*  HCT 31.2* 31.0*  MCV 69.5* 69.4*  PLT 263 268   Lab Results  Component Value Date   INR 1.01 07/20/2011   INR 1.02 06/22/2011   INR 1.01 11/21/2010   Sed Rate 63 CRP 11.1  Studies/Results: Mr Knee Right Wo Contrast  02/02/2012  *RADIOLOGY REPORT*  Clinical Data:  Swelling of the stump of the right BK amputation. Fever.  MRI OF THE RIGHT KNEE WITHOUT CONTRAST  Technique:  Multiplanar, multisequence MR imaging of the right knee was performed.  No intravenous contrast was  administered.  Comparison:  Radiographs dated 05/04/2011  FINDINGS: There is a prominent soft tissue fluid collection measuring 8.7 x 5.8 x 2.9 cm lying anterior to the stump of the tibia and extending medially.  There is extensive edema in the adjacent soft tissues.  The findings are consistent with an abscess and cellulitis.  An inflamed pseudo bursa could give the same appearance.  There is no underlying myositis or osteomyelitis.  There is no knee joint effusion.  There is focal grade 4 chondromalacia of the medial central portion of the lateral tibial plateau.  Cruciate and collateral ligaments are normal.  Patellar tendon is normal. Menisci are intact.  IMPRESSION: Large subcutaneous fluid collection at the anterior medial aspect of the distal stump consistent with an abscess with adjacent cellulitis.   Original Report Authenticated By: Francene Boyers, M.D.      Medications: Scheduled:    . aspirin EC  81 mg Oral Daily  . atorvastatin  40 mg Oral QHS  . carvedilol  6.25 mg Oral BID  . cholecalciferol  5,000 Units Oral Daily  . enoxaparin (LOVENOX) injection  70 mg Subcutaneous Q24H  . escitalopram  20 mg Oral QPC breakfast  . felodipine  5 mg Oral QPC breakfast  . ferrous sulfate  325 mg Oral TID PC  . fluticasone  1 spray Each Nare Daily  . furosemide  80 mg Oral Daily  . insulin pump   Subcutaneous Q4H  . levothyroxine  250 mcg Oral QAC breakfast  . loratadine  10 mg Oral Daily  . losartan  50 mg Oral Daily  . multivitamin with minerals  1 tablet Oral Daily  . omega-3 acid ethyl esters  2 g Oral Daily  . pantoprazole  20 mg Oral Q breakfast  . piperacillin-tazobactam (ZOSYN)  IV  3.375 g Intravenous Q8H  . sodium chloride  3 mL Intravenous Q12H  . vancomycin  2,000 mg Intravenous Q24H  . [DISCONTINUED] aspirin EC  81 mg Oral Daily  . [DISCONTINUED] enoxaparin (LOVENOX) injection  70 mg Subcutaneous Q24H  . [DISCONTINUED] enoxaparin (LOVENOX) injection  70 mg Subcutaneous Q24H  .  [DISCONTINUED] enoxaparin (LOVENOX) injection  70 mg Subcutaneous Q24H   Continuous:   Assessment/Plan: Principal Problem: 1. *Right BKA infection- MRI has confirmed abscess w/ adjacent cellulitis at the anterior medial aspect of the distal stump on RLE. WBC remains stable and pt remains afebrile.  Continue empiric Vanc/Zosyn that is being managed by pharmacy.  Ortho is planning on taking patient to OR tonight for incision and irrigational debridement of right leg abscess. NPO after 9am, with light breakfast, order placed. Holding her ASA 81 and lovenox today pending OR.    Active Problems: 2. Diabetes mellitus type 1, uncontrolled with neurologic/renal complications- fluctuating sugars.  Basal rates increased from 2.9 to 3.0 units during this hospitalization, but she suffered from low AM sugars, so decreased basal back to 2.9 from 12am-6am. FSBS staying in 100s, except for an isolated 59 at 2200 last night. Will continue to monitor.  3. 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  Increases risk of ulceration and infections. 4. HYPERTENSION- BP stable over past 24 hours.  Continue home regimen and monitor.  Hold on increasign Carvedilol or felodipine. 5. CRI, Stage IV- monitor with ongoing antibiotic use. GFR stable at 30 6. Disposition- will likely need 4-6 weeks IV abx after discharge. Will place social work consult today to work on getting this funded.    LOS: 3 days   [name] 02/04/2012, 6:38 AM

## 2012-02-04 NOTE — Progress Notes (Addendum)
Called [facility] to discuss possibility of getting home IV abx for 4-6 weeks at d/c.  Even though pt shows [employer] coverage in [place], the pt states that this coverage ran out and she can't afford her rent, much less her medications.  Had to leave a voicemail for [facility] RN to call me back.  Await answer and will notify MD of such when received.    ADDENDUM:  Received call back at 1420pm from [facility] and they will make the arrangements for the home IV abx for patient. Pam is their hospital IV meds rep and she will complete this process.

## 2012-02-04 NOTE — Anesthesia Preprocedure Evaluation (Addendum)
Anesthesia Evaluation  Patient identified by MRN, date of birth, ID band Patient awake    Reviewed: Allergy & Precautions, H&P , NPO status , Patient's Chart, lab work & pertinent test results, reviewed documented beta blocker date and time   Airway Mallampati: II TM Distance: >3 FB Neck ROM: full    Dental  (+) Teeth Intact and Dental Advisory Given   Pulmonary shortness of breath, sleep apnea ,          Cardiovascular hypertension, Pt. on medications and Pt. on home beta blockers + dysrhythmias Supra Ventricular Tachycardia Rhythm:regular Rate:Normal     Neuro/Psych  Headaches, PSYCHIATRIC DISORDERS Anxiety Depression    GI/Hepatic GERD-  ,  Endo/Other  diabetes, Type 1, Insulin DependentHypothyroidism   Renal/GU CRFRenal disease     Musculoskeletal   Abdominal   Peds  Hematology  (+) Blood dyscrasia, anemia ,   Anesthesia Other Findings   Reproductive/Obstetrics                          Anesthesia Physical Anesthesia Plan  ASA: III  Anesthesia Plan: General   Post-op Pain Management:    Induction: Intravenous  Airway Management Planned: LMA and Oral ETT  Additional Equipment:   Intra-op Plan:   Post-operative Plan: Extubation in OR  Informed Consent: I have reviewed the patients History and Physical, chart, labs and discussed the procedure including the risks, benefits and alternatives for the proposed anesthesia with the patient or authorized representative who has indicated his/her understanding and acceptance.   Dental advisory given  Plan Discussed with: CRNA, Anesthesiologist and Surgeon  Anesthesia Plan Comments:        Anesthesia Quick Evaluation

## 2012-02-04 NOTE — Progress Notes (Signed)
ANTIBIOTIC CONSULT NOTE - FOLLOW UP  Pharmacy Consult for Vancomycin Indication: Cellulitis, probable abscess  Allergies  Allergen Reactions  . Fish Allergy Anaphylaxis and Itching    "I can eat tuna, shrimp, salmon; can't eat anything else" (02/01/2012)  . Travatan (Travoprost) Itching  . Other Itching    Grapes "my throat itches" (02/01/2012)    Patient Measurements: Height: 6\' 2"  (188 cm) Weight: 329 lb (149.233 kg) IBW/kg (Calculated) : 77.7  Adjusted Body Weight:   Vital Signs: Temp: 98.2 F (36.8 C) (12/05 1200) Temp src: Oral (12/05 1200) BP: 121/53 mmHg (12/05 1200) Pulse Rate: 85  (12/05 0644) Intake/Output from previous day:   Intake/Output from this shift: Total I/O In: 710 [P.O.:710] Out: 850 [Urine:850]  Labs:  [facility] 02/04/12 0520 02/03/12 0512 02/02/12 0600  WBC 6.1 6.5 6.5  HGB 9.9* 10.0* 10.3*  PLT 263 268 256  LABCREA -- -- --  CREATININE 2.28* 2.17* 2.15*   Estimated Creatinine Clearance: 50.6 ml/min (by C-G formula based on Cr of 2.28).  Basename 02/04/12 1505  VANCOTROUGH 22.4*  VANCOPEAK --  VANCORANDOM --  GENTTROUGH --  GENTPEAK --  GENTRANDOM --  TOBRATROUGH --  TOBRAPEAK --  TOBRARND --  AMIKACINPEAK --  AMIKACINTROU --  AMIKACIN --     Microbiology: No results found for this or any previous visit (from the past 720 hour(s)).  Anti-infectives     Start     Dose/Rate Route Frequency Ordered Stop   02/04/12 1630   vancomycin (VANCOCIN) 1,500 mg in sodium chloride 0.9 % 500 mL IVPB        1,500 mg 250 mL/hr over 120 Minutes Intravenous Every 24 hours 02/04/12 1608     02/02/12 1515   vancomycin (VANCOCIN) 2,000 mg in sodium chloride 0.9 % 500 mL IVPB  Status:  Discontinued        2,000 mg 250 mL/hr over 120 Minutes Intravenous Every 24 hours 02/01/12 1604 02/02/12 1053   02/02/12 1500   vancomycin (VANCOCIN) 2,000 mg in sodium chloride 0.9 % 500 mL IVPB  Status:  Discontinued        2,000 mg 250 mL/hr over 120 Minutes  Intravenous Every 24 hours 02/02/12 1053 02/04/12 1609   02/01/12 1430   vancomycin (VANCOCIN) 2,000 mg in sodium chloride 0.9 % 500 mL IVPB        2,000 mg 250 mL/hr over 120 Minutes Intravenous  Once 02/01/12 1418 02/01/12 1717   02/01/12 1430  piperacillin-tazobactam (ZOSYN) IVPB 3.375 g       3.375 g 12.5 mL/hr over 240 Minutes Intravenous 3 times per day 02/01/12 1418            Assessment: 46 yr old female has been getting Vancomycin for cellulitis. Trough was checked prior to today's dose. The trough was 22.4 and the goal trough was 10-15. Therefore, the trough was a little higher than we wanted. Will decrease the dose a little to bring the trough into the desired range. Pt will be going home on IV antibiotics.  Goal of Therapy:  Vancomycin trough level 10-15 mcg/ml  Plan:  Will decrease the daily dose to 1500 mg. Levels when appropriate.   Eugene Garnet 02/04/2012,4:14 PM

## 2012-02-04 NOTE — Progress Notes (Addendum)
Ms. Upham' CBG around 2000 was 96.  She did not give herself any insulin (per her pump) She was given Peanut Butter and crackers to eat.  But she said she waited till around 2200 to eat when she felt shaky.  Notified Dr. Link Snuffer, S. Regarding Pt.'s blood sugar. Explained that it dropped to 54 and increased to 103 with food intervention around 2230 02/03/12. Pt. Has insulin Pump from home.  She may have light breakfast in am then NPO for Surgery later in the day. Charise Carwin, RN.

## 2012-02-04 NOTE — Progress Notes (Signed)
Patient ID: [name], female   DOB: [birth year], 46 y.o.   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

## 2012-02-04 NOTE — Transfer of Care (Signed)
Immediate Anesthesia Transfer of Care Note  Patient: [name]  Procedure(s) Performed: Procedure(s) (LRB) with comments: IRRIGATION AND DEBRIDEMENT EXTREMITY (Right)  Patient Location: PACU  Anesthesia Type:General  Level of Consciousness: awake, alert  and oriented  Airway & Oxygen Therapy: Patient Spontanous Breathing and Patient connected to nasal cannula oxygen  Post-op Assessment: Report given to PACU RN and Post -op Vital signs reviewed and stable  Post vital signs: Reviewed and stable  Complications: No apparent anesthesia complications

## 2012-02-04 NOTE — Brief Op Note (Signed)
02/01/2012 - 02/04/2012  8:44 PM  PATIENT:  [name]  46 y.o. 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  PROCEDURE:  Procedure(s) (LRB) with comments: IRRIGATION AND DEBRIDEMENT EXTREMITY (Right) Incisional debridement sharply with scalpel of skin, subcutaneous tissue, 8 cm in length, with primary closure  SURGEON:  Surgeon(s) and Role:    * Shelda Pal, MD - Primary  PHYSICIAN ASSISTANT: None  ANESTHESIA:   general  EBL:  Total I/O In: 1300 [I.V.:1300] Out: 0   BLOOD ADMINISTERED:none  DRAINS: (1 medium) Hemovact drain(s) in the right leg with  Suction Open   LOCAL MEDICATIONS USED:  NONE  SPECIMEN:  Source of Specimen:  right leg wound  DISPOSITION OF SPECIMEN:  PATHOLOGY  COUNTS:  YES  TOURNIQUET:  * No tourniquets in log *  DICTATION: .Other Dictation: Dictation Number [id number]  PLAN OF CARE: Admit to inpatient   PATIENT DISPOSITION:  PACU - hemodynamically stable.   Delay start of Pharmacological VTE agent (>24hrs) due to surgical blood loss or risk of bleeding: no

## 2012-02-04 NOTE — Progress Notes (Signed)
Inpatient Diabetes Program Recommendations  AACE/ADA: New Consensus Statement on Inpatient Glycemic Control (2013)  Target Ranges:  Prepandial:   less than 140 mg/dL      Peak postprandial:   less than 180 mg/dL (1-2 hours)      Critically ill patients:  140 - 180 mg/dL   Reason for Visit: Pt scheduled for I & D which typically should not be more than an hour or so.  Pt should be able to stay on the insulin pump during this procedure. If it is thought that the procedure should be longer than 1.5 hours, would recommend suspending the pump (and disconnecting) and using the insulin drip per [employer].   Note: Thank you, Lenor Coffin, RN, CNS, Diabetes Coordinator [phone])

## 2012-02-05 ENCOUNTER — Encounter (HOSPITAL_COMMUNITY)
Admission: AD | Disposition: A | Discharge status: Home Health | Payer: Self-pay | Source: Ambulatory Visit | Attending: Internal Medicine

## 2012-02-05 ENCOUNTER — Inpatient Hospital Stay (HOSPITAL_COMMUNITY): Payer: BC Managed Care – PPO

## 2012-02-05 ENCOUNTER — Encounter (HOSPITAL_COMMUNITY): Payer: Self-pay | Admitting: Orthopedic Surgery

## 2012-02-05 ENCOUNTER — Inpatient Hospital Stay (HOSPITAL_COMMUNITY): Payer: BC Managed Care – PPO | Admitting: Anesthesiology

## 2012-02-05 ENCOUNTER — Encounter (HOSPITAL_COMMUNITY): Payer: Self-pay | Admitting: Anesthesiology

## 2012-02-05 DIAGNOSIS — N186 End stage renal disease: Secondary | ICD-10-CM

## 2012-02-05 HISTORY — PX: CENTRAL VENOUS CATHETER INSERTION: SHX401

## 2012-02-05 LAB — GLUCOSE, CAPILLARY
Glucose-Capillary: 100 mg/dL — ABNORMAL HIGH (ref 70–99)
Glucose-Capillary: 128 mg/dL — ABNORMAL HIGH (ref 70–99)
Glucose-Capillary: 149 mg/dL — ABNORMAL HIGH (ref 70–99)
Glucose-Capillary: 226 mg/dL — ABNORMAL HIGH (ref 70–99)
Glucose-Capillary: 59 mg/dL — ABNORMAL LOW (ref 70–99)
Glucose-Capillary: 68 mg/dL — ABNORMAL LOW (ref 70–99)
Glucose-Capillary: 83 mg/dL (ref 70–99)
Glucose-Capillary: 94 mg/dL (ref 70–99)

## 2012-02-05 LAB — BASIC METABOLIC PANEL
BUN: 29 mg/dL — ABNORMAL HIGH (ref 6–23)
Chloride: 100 mEq/L (ref 96–112)
GFR calc Af Amer: 25 mL/min — ABNORMAL LOW (ref >90)
GFR calc non Af Amer: 22 mL/min — ABNORMAL LOW (ref >90)
Potassium: 4.1 mEq/L (ref 3.5–5.1)
Sodium: 136 mEq/L (ref 135–145)

## 2012-02-05 LAB — CBC
MCHC: 31.5 g/dL (ref 30.0–36.0)
Platelets: 242 10*3/uL (ref 150–400)
RDW: 14.4 % (ref 11.5–15.5)
WBC: 6 10*3/uL (ref 4.0–10.5)

## 2012-02-05 SURGERY — INSERTION, CATHETER, CENTRAL VENOUS, HICKMAN
Anesthesia: Monitor Anesthesia Care | Wound class: Clean

## 2012-02-05 MED ORDER — PROMETHAZINE HCL 25 MG/ML IJ SOLN: 6.2500 mg | INTRAMUSCULAR | Status: DC | PRN

## 2012-02-05 MED ORDER — LIDOCAINE-EPINEPHRINE (PF) 1 %-1:200000 IJ SOLN
INTRAMUSCULAR | Status: DC | PRN
  Administered 2012-02-05: 30 mL

## 2012-02-05 MED ORDER — 0.9 % SODIUM CHLORIDE (POUR BTL) OPTIME
TOPICAL | Status: DC | PRN
  Administered 2012-02-05: 1000 mL

## 2012-02-05 MED ORDER — MUPIROCIN 2 % EX OINT
1.0000 "application " | TOPICAL_OINTMENT | Freq: Two times a day (BID) | CUTANEOUS | Status: AC
  Administered 2012-02-05 – 2012-02-09 (×10): 1 via NASAL
  Filled 2012-02-05 (×2): qty 22

## 2012-02-05 MED ORDER — MEPERIDINE HCL 25 MG/ML IJ SOLN: 6.2500 mg | INTRAMUSCULAR | Status: DC | PRN

## 2012-02-05 MED ORDER — HEPARIN SODIUM (PORCINE) 1000 UNIT/ML IJ SOLN
INTRAMUSCULAR | Status: DC | PRN
  Administered 2012-02-05: 10 mL

## 2012-02-05 MED ORDER — OXYCODONE HCL 5 MG PO TABS
5.0000 mg | ORAL_TABLET | Freq: Once | ORAL | Status: DC | PRN
Start: 2012-02-05 — End: 2012-02-05

## 2012-02-05 MED ORDER — FENTANYL CITRATE 0.05 MG/ML IJ SOLN
INTRAMUSCULAR | Status: DC | PRN
  Administered 2012-02-05: 100 ug via INTRAVENOUS

## 2012-02-05 MED ORDER — SODIUM CHLORIDE 0.9 % IR SOLN
Status: DC | PRN
  Administered 2012-02-05: 14:00:00

## 2012-02-05 MED ORDER — CHLORHEXIDINE GLUCONATE CLOTH 2 % EX PADS
6.0000 | MEDICATED_PAD | Freq: Every day | CUTANEOUS | Status: DC
  Administered 2012-02-05 – 2012-02-08 (×4): 6 via TOPICAL

## 2012-02-05 MED ORDER — MIDAZOLAM HCL 5 MG/5ML IJ SOLN
INTRAMUSCULAR | Status: DC | PRN
  Administered 2012-02-05: 2 mg via INTRAVENOUS

## 2012-02-05 MED ORDER — SODIUM CHLORIDE 0.9 % IV SOLN
INTRAVENOUS | Status: DC | PRN
  Administered 2012-02-05: 14:00:00 via INTRAVENOUS

## 2012-02-05 MED ORDER — OXYCODONE HCL 5 MG/5ML PO SOLN: 5.0000 mg | Freq: Once | ORAL | Status: DC | PRN

## 2012-02-05 MED ORDER — HYDROMORPHONE HCL PF 1 MG/ML IJ SOLN: 0.2500 mg | INTRAMUSCULAR | Status: DC | PRN

## 2012-02-05 SURGICAL SUPPLY — 48 items
BAG DECANTER FOR FLEXI CONT (MISCELLANEOUS) ×2 IMPLANT
BENZOIN TINCTURE PRP APPL 2/3 (GAUZE/BANDAGES/DRESSINGS) ×2 IMPLANT
BLADE SURG 11 STRL SS (BLADE) ×2 IMPLANT
CATH CANNON HEMO 15F 50CM (CATHETERS) IMPLANT
CATH CANNON HEMO 15FR 19 (HEMODIALYSIS SUPPLIES) IMPLANT
CATH CANNON HEMO 15FR 23CM (HEMODIALYSIS SUPPLIES) IMPLANT
CATH CANNON HEMO 15FR 31CM (HEMODIALYSIS SUPPLIES) IMPLANT
CATH CANNON HEMO 15FR 32CM (HEMODIALYSIS SUPPLIES) IMPLANT
CLIP LIGATING EXTRA MED SLVR (CLIP) IMPLANT
CLIP LIGATING EXTRA SM BLUE (MISCELLANEOUS) IMPLANT
CLOTH BEACON ORANGE TIMEOUT ST (SAFETY) ×2 IMPLANT
COVER DOME SNAP 22 D (MISCELLANEOUS) ×2 IMPLANT
COVER PROBE W GEL 5X96 (DRAPES) ×2 IMPLANT
COVER SURGICAL LIGHT HANDLE (MISCELLANEOUS) ×2 IMPLANT
DECANTER SPIKE VIAL GLASS SM (MISCELLANEOUS) IMPLANT
DRAPE CHEST BREAST 15X10 FENES (DRAPES) ×2 IMPLANT
GAUZE SPONGE 2X2 8PLY STRL LF (GAUZE/BANDAGES/DRESSINGS) ×2 IMPLANT
GAUZE SPONGE 4X4 16PLY XRAY LF (GAUZE/BANDAGES/DRESSINGS) ×2 IMPLANT
GLOVE BIOGEL PI IND STRL 6 (GLOVE) ×1 IMPLANT
GLOVE BIOGEL PI INDICATOR 6 (GLOVE) ×1
GLOVE SS BIOGEL STRL SZ 7.5 (GLOVE) ×1 IMPLANT
GLOVE SUPERSENSE BIOGEL SZ 7.5 (GLOVE) ×1
GLOVE SURG SS PI 7.0 STRL IVOR (GLOVE) ×4 IMPLANT
GOWN STRL NON-REIN LRG LVL3 (GOWN DISPOSABLE) ×2 IMPLANT
GOWN STRL REIN XL XLG (GOWN DISPOSABLE) ×4 IMPLANT
KIT BASIN OR (CUSTOM PROCEDURE TRAY) ×2 IMPLANT
KIT ROOM TURNOVER OR (KITS) ×2 IMPLANT
NEEDLE 18GX1X1/2 (RX/OR ONLY) (NEEDLE) ×4 IMPLANT
NEEDLE 22X1 1/2 (OR ONLY) (NEEDLE) ×4 IMPLANT
NEEDLE HYPO 25GX1X1/2 BEV (NEEDLE) ×2 IMPLANT
NS IRRIG 1000ML POUR BTL (IV SOLUTION) ×2 IMPLANT
PACK SURGICAL SETUP 50X90 (CUSTOM PROCEDURE TRAY) ×2 IMPLANT
PAD ARMBOARD 7.5X6 YLW CONV (MISCELLANEOUS) ×4 IMPLANT
SOAP 2 % CHG 4 OZ (WOUND CARE) ×2 IMPLANT
SPONGE GAUZE 2X2 STER 10/PKG (GAUZE/BANDAGES/DRESSINGS) ×2
STRIP CLOSURE SKIN 1/2X4 (GAUZE/BANDAGES/DRESSINGS) ×2 IMPLANT
STRIP CLOSURE SKIN 1/4X4 (GAUZE/BANDAGES/DRESSINGS) ×2 IMPLANT
SUT ETHILON 3 0 PS 1 (SUTURE) ×2 IMPLANT
SUT VICRYL 4-0 PS2 18IN ABS (SUTURE) ×2 IMPLANT
SYR 20CC LL (SYRINGE) ×2 IMPLANT
SYR 30ML LL (SYRINGE) IMPLANT
SYR 5ML LL (SYRINGE) ×4 IMPLANT
SYR CONTROL 10ML LL (SYRINGE) ×2 IMPLANT
SYRINGE 10CC LL (SYRINGE) ×4 IMPLANT
TAPE CLOTH SURG 4X10 WHT LF (GAUZE/BANDAGES/DRESSINGS) ×4 IMPLANT
TOWEL OR 17X24 6PK STRL BLUE (TOWEL DISPOSABLE) ×2 IMPLANT
TOWEL OR 17X26 10 PK STRL BLUE (TOWEL DISPOSABLE) ×2 IMPLANT
WATER STERILE IRR 1000ML POUR (IV SOLUTION) IMPLANT

## 2012-02-05 NOTE — Progress Notes (Signed)
Subjective: Had some FSBS in the 60s post op from the debridement and drainage of abscess, but now normalized. Her pain is tolerable this AM. No complications from procedure.   Objective: Vital signs in last 24 hours: Temp:  [97.6 F (36.4 C)-98.4 F (36.9 C)] 98.4 F (36.9 C) (12/06 0421) Pulse Rate:  [74-81] 74  (12/06 0421) Resp:  [12-18] 15  (12/06 0421) BP: (121-142)/(24-77) 136/77 mmHg (12/06 0421) SpO2:  [91 %-100 %] 100 % (12/06 0421) Weight change:  Last BM Date: 02/02/12  CBG (last 3)   Basename 02/05/12 0055 02/04/12 2126 02/04/12 2102  GLUCAP 83 68* 72    Intake/Output from previous day: 12/05 0701 - 12/06 0700 In: 2030 [P.O.:710; I.V.:1300] Out: 850 [Urine:850] Intake/Output this shift:    General appearance: alert and no distress Eyes: no scleral icterus Throat: oropharynx moist without erythema Resp: clear to auscultation bilaterally Cardio: regular rate and rhythm, S1, S2 normal, no murmur, click, rub or gallop GI: soft, non-tender; bowel sounds normal; no masses,  no organomegaly 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   Lab Results:  [facility] 02/04/12 0520 02/03/12 0512  NA 138 137  K 3.8 3.6  CL 101 100  CO2 25 26  GLUCOSE 156* 95  BUN 34* 36*  CREATININE 2.28* 2.17*  CALCIUM 8.6 9.0  MG -- --  PHOS -- --   No results found for this basename: AST:2,ALT:2,ALKPHOS:2,BILITOT:2,PROT:2,ALBUMIN:2 in the last 72 hours  Basename 02/04/12 0520 02/03/12 0512  WBC 6.1 6.5  NEUTROABS -- --  HGB 9.9* 10.0*  HCT 31.2* 31.0*  MCV 69.5* 69.4*  PLT 263 268   Lab Results  Component Value Date   INR 1.01 07/20/2011   INR 1.02 06/22/2011   INR 1.01 11/21/2010   Sed Rate 63 CRP 11.1  Studies/Results: No results found.   Medications: Scheduled:    . aspirin EC  81 mg Oral Daily  . atorvastatin  40 mg Oral QHS  . carvedilol  6.25 mg Oral BID  . Chlorhexidine Gluconate Cloth  6 each Topical Daily  .  cholecalciferol  5,000 Units Oral Daily  . enoxaparin (LOVENOX) injection  70 mg Subcutaneous Q24H  . escitalopram  20 mg Oral QPC breakfast  . felodipine  5 mg Oral QPC breakfast  . ferrous sulfate  325 mg Oral TID PC  . fluticasone  1 spray Each Nare Daily  . furosemide  80 mg Oral BID  . insulin pump   Subcutaneous Q4H  . levothyroxine  250 mcg Oral QAC breakfast  . loratadine  10 mg Oral Daily  . losartan  50 mg Oral Daily  . multivitamin with minerals  1 tablet Oral Daily  . mupirocin ointment  1 application Nasal BID  . omega-3 acid ethyl esters  2 g Oral Daily  . pantoprazole  20 mg Oral Q breakfast  . piperacillin-tazobactam (ZOSYN)  IV  3.375 g Intravenous Q8H  . sodium chloride  3 mL Intravenous Q12H  . vancomycin  1,500 mg Intravenous Q24H  . [DISCONTINUED] vancomycin  2,000 mg Intravenous Q24H   Continuous:   Assessment/Plan: Principal Problem: 1. *Right BKA infection- Pt is post op day 1 from uncomplicated abscess drainage and wound debridement of R BKA stump. Continue empiric Vanc/Zosyn that is being managed by pharmacy. Pt will need 4 weeks abx after discharge. Place PICC today. OK to resume ASA 81 and lovenox. CBC pending this AM  Active Problems: 2. Diabetes  mellitus type 1, uncontrolled with neurologic/renal complications- fluctuating sugars.  Basal rates increased from 2.9 to 3.0 units during this hospitalization, but she suffered from low AM sugars, so decreased basal back to 2.9 from 12am-6am. Will continue to monitor.  3. 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  Increases risk of ulceration and infections. 4. HYPERTENSION- BP stable over past 24 hours.  Continue home regimen and monitor.  Hold on increasign Carvedilol or felodipine. 5. CRI, Stage IV- monitor with ongoing antibiotic use. GFR stable at 30. BMP pending this AM 6. Disposition- will likely need 4 weeks IV abx after discharge. Place PICC today. Monitor over weekend on abx. Care management is working on  getting IV abx after discharge. Likely d/c Monday w/ Dr Charlann Boxer f/u in 2 weeks   LOS: 4 days   Quinta Eimer 02/05/2012, 8:15 AM

## 2012-02-05 NOTE — Anesthesia Postprocedure Evaluation (Signed)
  Anesthesia Post-op Note  Patient: [name]  Procedure(s) Performed: Procedure(s) (LRB) with comments: INSERTION HICKMAN CATHETER (N/A) - Ultrasound guided  Patient Location: PACU  Anesthesia Type:MAC  Level of Consciousness: awake  Airway and Oxygen Therapy: Patient Spontanous Breathing  Post-op Pain: mild  Post-op Assessment: Post-op Vital signs reviewed  Post-op Vital Signs: stable  Complications: No apparent anesthesia complications

## 2012-02-05 NOTE — Progress Notes (Signed)
   Subjective: 1 Day Post-Op Procedure(s) (LRB): IRRIGATION AND DEBRIDEMENT EXTREMITY (Right)   Patient reports pain as mild, pain well controlled. No events throughout the night.  Objective:   VITALS:   Filed Vitals:   02/05/12 0421  BP: 136/77  Pulse: 74  Temp: 98.4 F (36.9 C)  Resp: 15    Incision: moderate drainage No cellulitis present Compartment soft  LABS  Basename 02/04/12 0520 02/03/12 0512  HGB 9.9* 10.0*  HCT 31.2* 31.0*  WBC 6.1 6.5  PLT 263 268     Basename 02/04/12 0520 02/03/12 0512  NA 138 137  K 3.8 3.6  BUN 34* 36*  CREATININE 2.28* 2.17*  GLUCOSE 156* 95     Assessment/Plan: 1 Day Post-Op Procedure(s) (LRB): IRRIGATION AND DEBRIDEMENT EXTREMITY (Right)  Incision of the abscess was preformed yesterday. HV drain was place and this will be kept in place until there is minimal drainage, the rest of the incision was closed. Previous surgeries resulted in a collection of fluid under the skin and we are trying to prevent with keeping the HV drain in place. She will need 4 weeks of antibiotics for soft tissue infection.  Probably should be here through the weekend assure decreased drainage from the HV drain.    Anastasio Auerbach Yazmina Pareja   PAC  02/05/2012, 8:40 AM

## 2012-02-05 NOTE — Op Note (Signed)
OPERATIVE REPORT  DATE OF SURGERY: 02/05/2012  PATIENT: [name], 46 y.o. female MRN: [id number]  DOB: 07/26/1965  PRE-OPERATIVE DIAGNOSIS: Infected wound requiring long-term antibiotic  POST-OPERATIVE DIAGNOSIS:  Same  PROCEDURE: Right IJ single-lumen Hickman  SURGEON:  Gretta Began, M.D.  PHYSICIAN ASSISTANT: None  ANESTHESIA:  1% lidocaine  EBL: Minimal ml  Total I/O In: 360 [P.O.:360] Out: 350 [Urine:350]  BLOOD ADMINISTERED: None  DRAINS: None  SPECIMEN: None  COUNTS CORRECT:  YES  PLAN OF CARE: PACU with chest x-ray pending   PATIENT DISPOSITION:  PACU - hemodynamically stable  PROCEDURE DETAILS: Patient has wound requiring long-term infection. We are consult for placement of a Hickman catheter. No 2 lm Hickman for available in the hospital in interventional radiology or surgical compartment. Single-lumen was placed.  The patient was taken to the operating room where the area the right neck was imaged with ultrasound revealing widely patent jugular vein. The right and left neck and chest were prepped and draped in usual sterile fashion. The patient was placed in Trendelenburg position and using local anesthesia and the vein was entered with an 18-gauge needle and a guidewire was passed down the level of the right atrium. Separate incision was made below the level of the clavicle and a, was created from this to the guidewire entry site. The single-lumen Hickman catheter was brought through the subcutaneous tunnel. The area of the chest was reimaged to determine the appropriate length of the Hickman catheter for positioning in the distal right atrium. The catheter was divided. A dilator and peel-away sheath was passed over the guidewire and the dilator and guidewire removed. The catheter was passed down the peel-away sheath which was then removed as well. The catheter flushed and aspirated easily and was locked with 1000 unit per cc heparin. The catheter was secured to  the skin with a 3-0 nylon stitch and the entry site was closed with a 4 septic Vicryl stitch. Sterile dressing was applied the patient was taken to the recovery room where chest x-ray is pending   Gretta Began, M.D. 02/05/2012 2:45 PM

## 2012-02-05 NOTE — Transfer of Care (Signed)
Immediate Anesthesia Transfer of Care Note  Patient: [name]  Procedure(s) Performed: Procedure(s) (LRB) with comments: INSERTION HICKMAN CATHETER (N/A) - Ultrasound guided  Patient Location: PACU  Anesthesia Type:MAC  Level of Consciousness: awake, alert  and oriented  Airway & Oxygen Therapy: Patient Spontanous Breathing and Patient connected to nasal cannula oxygen  Post-op Assessment: Report given to PACU RN and Post -op Vital signs reviewed and stable  Post vital signs: Reviewed and stable  Complications: No apparent anesthesia complications

## 2012-02-05 NOTE — Anesthesia Preprocedure Evaluation (Signed)
Anesthesia Evaluation  Patient identified by MRN, date of birth, ID band Patient awake    Reviewed: Allergy & Precautions, H&P , NPO status , Patient's Chart, lab work & pertinent test results, reviewed documented beta blocker date and time   Airway Mallampati: II TM Distance: >3 FB Neck ROM: full    Dental  (+) Teeth Intact and Dental Advisory Given   Pulmonary shortness of breath, sleep apnea ,          Cardiovascular hypertension, Pt. on medications and Pt. on home beta blockers + dysrhythmias Supra Ventricular Tachycardia Rhythm:regular Rate:Normal     Neuro/Psych  Headaches, PSYCHIATRIC DISORDERS    GI/Hepatic GERD-  ,  Endo/Other  diabetes, Type 1, Insulin DependentHypothyroidism   Renal/GU CRFRenal disease     Musculoskeletal   Abdominal   Peds  Hematology  (+) Blood dyscrasia, anemia ,   Anesthesia Other Findings   Reproductive/Obstetrics                           Anesthesia Physical Anesthesia Plan  ASA: III  Anesthesia Plan: MAC   Post-op Pain Management:    Induction:   Airway Management Planned:   Additional Equipment:   Intra-op Plan:   Post-operative Plan:   Informed Consent: I have reviewed the patients History and Physical, chart, labs and discussed the procedure including the risks, benefits and alternatives for the proposed anesthesia with the patient or authorized representative who has indicated his/her understanding and acceptance.     Plan Discussed with: CRNA and Surgeon  Anesthesia Plan Comments:         Anesthesia Quick Evaluation

## 2012-02-05 NOTE — Op Note (Signed)
[name], [name] NO.:  [id number]  MEDICAL RECORD NO.:  [id number]  LOCATION:  [id number]                        FACILITY:  [facility]  PHYSICIAN:  Madlyn Frankel. Charlann Boxer, M.D.  DATE OF BIRTH:  February 15, 1966  DATE OF PROCEDURE:  02/04/2012 DATE OF DISCHARGE:                              OPERATIVE REPORT   PREOPERATIVE DIAGNOSIS:  Status post right below-the-knee amputation with a recurrent abscess on the medial aspect of the knee with overlying cellulitic changes.  POSTOPERATIVE DIAGNOSIS:  Status post right below-the-knee amputation with a recurrent abscess on the medial aspect of the knee with overlying cellulitic changes.  PROCEDURE:  Sharp incisional debridement of a right leg abscess using a scalpel measured approximately 8-10 cm including skin, subcutaneous tissue deep with placement of Hemovac drain.  SURGEON:  Madlyn Frankel. Charlann Boxer, MD  ASSISTANT:  Surgical Team.  ANESTHESIA:  General.  SPECIMENS:  Fluid from the abscess area was sent for Gram stain culture.  FINDINGS:  The patient was noted to have a small area of fluid but more of a hyperintense thickened area of tissue over this medial aspect of the leg as confirming based on findings on the MRI.  DRAINS:  One medium Hemovac.  COMPLICATION:  None.  BLOOD LOSS:  200 mL.  Note the patient was on high dose of Lovenox preoperatively.  INDICATION OF THE PROCEDURE:  Ms. [name] is a very pleasant, 46 year old female with a very challenging type 1 diabetes.  She has been a patient of mine from previous management of her right below-the-knee amputation stump, for which we have gotten her to heal at a couple of different times for various superficial issues.  She presented to the hospital a couple of days prior to the procedure with increasing low-grade fevers, erythema, and swelling of the leg consistent with findings for infection.  After reviewing with her the findings including MRI to rule out abscess, she  wished at this point to proceed.  Indication and procedure were discussed.  Benefits have been reviewed.  Risks of recurrence of abscess issues dealt with as well as wound complication were all reviewed. Consent was obtained.  PROCEDURE IN DETAIL:  The patient was brought to the operative theater. Once adequate anesthesia was established, she had been on a course of antibiotics preoperatively with vanc and Zosyn,  The right lower extremity was then prepped and draped in the sterile fashion.  A time-out was performed identifying the patient, planned procedure, and extremity.  A similar incision that had been previously done over the anterior aspect of the knee was extended over the anterior aspect of her leg medially based on MRI findings.  Sharp dissection was carried down to the area of her subcutaneous tissue.  I made an elliptical skin incision and removed some areas of old scar.  Soft dissection was carried down to the area, where the abscess was present, where cultures were obtained.  I then sharply excised the subcutaneous tissue as well as the swelled off area.  After obtaining some hemostasis and noting persistent oozing, I went ahead and irrigated the wound out with 3 L of normal saline solution.  I also irrigated the wound out with  a Betadine and saline solution.  Following this, I placed a medium Hemovac drain deep and finished off the irrigation and then reapproximated the skin edges using 2-0 nylon suture with interrupted horizontal mattress sutures.  The skin was then cleaned, dried, and dressed sterilely with a Hemovac applied.  The Xeroform dressing was placed over the wound with gauze and a bulky wrap.  She was then brought into the recovery room in stable condition tolerating the procedure well.  She will remain on IV antibiotics for approximately 4 weeks for management of this.  We will continue to watch her wound healing in the hospital, then in the office at a  2-week interval.     Madlyn Frankel. Charlann Boxer, M.D.     MDO/MEDQ  D:  02/04/2012  T:  02/05/2012  Job:  [id number]

## 2012-02-05 NOTE — Progress Notes (Signed)
Spoke with Dr. Briant Cedar regarding patients eleveated Creatinine and BUN for PICC placement. He recommended a central line based on the numbers. Rn notified Dr. Link Snuffer.

## 2012-02-05 NOTE — Preoperative (Signed)
Beta Blockers   Reason not to administer Beta Blockers:Not Applicable 

## 2012-02-05 NOTE — Consult Note (Signed)
[facility] [place] [facility] CONSULT NOTE 02/05/2012 DOB: [id number] MRN : [id number]  CC: IV access for long term antibiotics Referring Physician:Scott Link Snuffer, MD   History of Present Illness: [name] is a 46 y.o. female with hx DM, HTN CKD who had  I&D of Right BKA infection. Pt is post op day 1 from uncomplicated abscess drainage and wound debridement of R BKA stump. Continue empiric Vanc/Zosyn that is being managed by pharmacy. Pt will need 4 weeks abx after discharge. We were asked to place a double lumen IJ catheter for IV antibiotics as Renal service would like to avoid placement of a PICC line.   Past Medical History  Diagnosis Date  . Hypertension   . Hypothyroidism   . Diabetic retinopathy(362.0)   . Charcot ankle     AND FOOT - LEFT  . Osteomyelitis of foot 1993    PT REQUIRED BELOW THE KNEE AMPUTATION   . Blood transfusion     "multiple" (02/01/2012)  . GERD (gastroesophageal reflux disease)     PT WOULD TAKE PROTONIX IF HAVING GERD  . Depression   . Anxiety   . Sleep apnea     PT STATES SLEEP STUDY ABOUT 13 YRS AGO-TOLD SHE HAD SLEEP APNEA BUT NEVER GIVEN CPAP  . Hypercholesteremia   . SVT (supraventricular tachycardia) 2002    PAST HX SVT--HAD HEART ABLATION 2002--NO PROBLEMS SINCE  . Exertional dyspnea     "lately" (02/01/2012)  . Hashimoto thyroiditis, fibrous variant 1997  . Type 1 diabetes     PT HAS INSULIN PUMP-DR. ALTHEIMER MANAGES PT'S PUMP  . Thalassemia minor   . Iron deficiency anemia   . Headache     2010--DX WITH CLUSTER H/A'S -JUST OCCAS BAD HEADACHE NOW  . Sinus headache   . Migraine     "q couple years" (02/01/2012)  . DJD (degenerative joint disease) of hip     HX OF DJD LEFT HIP (S/P LEFT TOTAL HIP ARTHROPLASTY )AND ARTHRITIS LEFT ANKLE  . Arthritis     "shoulders; left ankle, foot, knee" (02/01/2012)  . Chronic kidney disease, stage 2 (mild)     Past Surgical History  Procedure Date  . Below knee leg amputation 1993     RIGHT  . Thyroidectomy 1997  . Retinal detachment repair w/ scleral buckle le 2004    "left" (02/01/2012)  . Transmetatarsal amputation left foot 1998  . Breast lumpectomy 2000    RIGHT BREAST LUMPECTOMY-BENIGN  . Cataract extraction w/ intraocular lens  implant, bilateral 2005-2007  . Joint replacement 2011    "left hip" (02/01/2012)  . Carpal tunnel release 2003    "right" (02/01/2012)  . Irrigation and debridement knee 06/23/2011    Procedure: IRRIGATION AND DEBRIDEMENT KNEE;  Surgeon: Shelda Pal, MD;  Location: [facility];  Service: Orthopedics;  Laterality: Right;  Irrigation and Debridement of Right Knee Abscess   . Refractive surgery 10/2010    retinal hemorrhaging  . I&d extremity 07/20/2011    Procedure: IRRIGATION AND DEBRIDEMENT EXTREMITY;  Surgeon: Shelda Pal, MD;  Location: [facility];  Service: Orthopedics;  Laterality: Right;  I&D of a Right Leg  . Total hip arthroplasty 2011    "left" (02/01/2012)  . Supraventricular tachycardia ablation 2002  . Cervical polypectomy 2012     ROS: [x]  Positive  [ ]  Denies    General: [ ]  Weight loss, [ ]  Fever, [ ]  chills Neurologic: [ ]  Dizziness, [ ]  Blackouts, [ ]  Seizure [ ]   Stroke, [ ]  "Mini stroke", [ ]  Slurred speech, [ ]  Temporary blindness; [ ]  weakness in arms or legs, [ ]  Hoarseness Cardiac: [ ]  Chest pain/pressure, [ ]  Shortness of breath at rest [ ]  Shortness of breath with exertion, [ ]  Atrial fibrillation or irregular heartbeat Vascular: [ ]  Pain in legs with walking, [ ]  Pain in legs at rest, [ ]  Pain in legs at night,  [ ]  Non-healing ulcer, [ ]  Blood clot in vein/DVT,   Pulmonary: [ ]  Home oxygen, [ ]  Productive cough, [ ]  Coughing up blood, [ ]  Asthma,  [ ]  Wheezing Musculoskeletal:  [ ]  Arthritis, [ ]  Low back pain, [ ]  Joint pain Hematologic: [ ]  Easy Bruising, [ ]  Anemia; [ ]  Hepatitis Gastrointestinal: [ ]  Blood in stool, [ ]  Gastroesophageal Reflux/heartburn, [ ]  Trouble swallowing Urinary: [x ] chronic Kidney  disease, [ ]  on HD - [ ]  MWF or [ ]  TTHS, [ ]  Burning with urination, [ ]  Difficulty urinating Skin: [ ]  Rashes, [ ]  Wounds Psychological: [ x] Anxiety, [x ] Depression  Social History History  Substance Use Topics  . Smoking status: Never Smoker   . Smokeless tobacco: Never Used  . Alcohol Use: Yes     Comment: 02/01/2012 "couple times/yr might have a glass of wine"    Family History History reviewed. No pertinent family history.  Allergies  Allergen Reactions  . Fish Allergy Anaphylaxis and Itching    "I can eat tuna, shrimp, salmon; can't eat anything else" (02/01/2012)  . Travatan (Travoprost) Itching  . Other Itching    Grapes "my throat itches" (02/01/2012)    Current Facility-Administered Medications  Medication Dose Route Frequency Provider Last Rate Last Dose  . 0.9 %  sodium chloride infusion  250 mL Intravenous PRN Kari Baars, MD      . acetaminophen (TYLENOL) tablet 650 mg  650 mg Oral Q6H PRN Kari Baars, MD   650 mg at 02/02/12 0746   Or  . acetaminophen (TYLENOL) suppository 650 mg  650 mg Rectal Q6H PRN Kari Baars, MD      . aspirin EC tablet 81 mg  81 mg Oral Daily Alysia Penna, MD   81 mg at 02/05/12 0948  . atorvastatin (LIPITOR) tablet 40 mg  40 mg Oral QHS Kari Baars, MD   40 mg at 02/05/12 0011  . carvedilol (COREG) tablet 6.25 mg  6.25 mg Oral BID Kari Baars, MD   6.25 mg at 02/05/12 0949  . Chlorhexidine Gluconate Cloth 2 % PADS 6 each  6 each Topical Daily Alysia Penna, MD      . cholecalciferol (VITAMIN D) tablet 5,000 Units  5,000 Units Oral Daily Brett Fairy, PHARMD   5,000 Units at 02/05/12 0948  . enoxaparin (LOVENOX) injection 70 mg  70 mg Subcutaneous Q24H Alysia Penna, MD      . escitalopram (LEXAPRO) tablet 20 mg  20 mg Oral QPC breakfast W Buren Kos, MD   20 mg at 02/05/12 0949  . felodipine (PLENDIL) 24 hr tablet 5 mg  5 mg Oral QPC breakfast W Buren Kos, MD   5 mg at 02/05/12 0949  . ferrous sulfate tablet 325 mg   325 mg Oral TID PC W Buren Kos, MD   325 mg at 02/05/12 0949  . fluticasone (FLONASE) 50 MCG/ACT nasal spray 1 spray  1 spray Each Nare Daily W Buren Kos, MD      . furosemide (LASIX)  tablet 80 mg  80 mg Oral BID Alysia Penna, MD   80 mg at 02/05/12 0835  . HYDROcodone-acetaminophen (NORCO/VICODIN) 5-325 MG per tablet 1-2 tablet  1-2 tablet Oral Q4H PRN Kari Baars, MD      . HYDROmorphone (DILAUDID) injection 0.5-2 mg  0.5-2 mg Intravenous Q3H PRN Shelda Pal, MD   1 mg at 02/05/12 0306  . insulin pump   Subcutaneous Q4H Kari Baars, MD   23.7 each at 02/05/12 0800  . levothyroxine (SYNTHROID, LEVOTHROID) tablet 250 mcg  250 mcg Oral QAC breakfast Kari Baars, MD   250 mcg at 02/05/12 [phone]  . loratadine (CLARITIN) tablet 10 mg  10 mg Oral Daily W Buren Kos, MD   10 mg at 02/05/12 0948  . losartan (COZAAR) tablet 50 mg  50 mg Oral Daily W Buren Kos, MD   50 mg at 02/05/12 0949  . multivitamin with minerals tablet 1 tablet  1 tablet Oral Daily W Buren Kos, MD   1 tablet at 02/05/12 [phone]  . mupirocin ointment (BACTROBAN) 2 % 1 application  1 application Nasal BID Alysia Penna, MD   1 application at 02/05/12 0200  . omega-3 acid ethyl esters (LOVAZA) capsule 2 g  2 g Oral Daily W Buren Kos, MD   2 g at 02/05/12 [phone]  . pantoprazole (PROTONIX) EC tablet 20 mg  20 mg Oral Q breakfast W Buren Kos, MD   20 mg at 02/05/12 0948  . piperacillin-tazobactam (ZOSYN) IVPB 3.375 g  3.375 g Intravenous Q8H Christella Hartigan, PHARMD   3.375 g at 02/05/12 8119  . sodium chloride 0.9 % injection 3 mL  3 mL Intravenous Q12H W Buren Kos, MD   3 mL at 02/02/12 1131  . sodium chloride 0.9 % injection 3 mL  3 mL Intravenous PRN Kari Baars, MD      . vancomycin [facility]) 1,500 mg in sodium chloride 0.9 % 500 mL IVPB  1,500 mg Intravenous Q24H Alysia Penna, MD   1,500 mg at 02/04/12 1751  . [DISCONTINUED] 0.9 % irrigation (POUR BTL)    PRN Shelda Pal, MD   1,000 mL at 02/04/12  2006  . [DISCONTINUED] HYDROmorphone (DILAUDID) injection 0.25-0.5 mg  0.25-0.5 mg Intravenous Q5 min PRN Rivka Barbara, MD      . [DISCONTINUED] morphine 2 MG/ML injection 2 mg  2 mg Intravenous Q4H PRN Kari Baars, MD      . [DISCONTINUED] ondansetron [facility]) injection 4 mg  4 mg Intravenous Once PRN Rivka Barbara, MD      . [DISCONTINUED] sodium chloride irrigation 0.9 %    PRN Shelda Pal, MD   3,000 mL at 02/04/12 2007  . [DISCONTINUED] vancomycin (VANCOCIN) 2,000 mg in sodium chloride 0.9 % 500 mL IVPB  2,000 mg Intravenous Q24H Christella Hartigan, PHARMD   2,000 mg at 02/03/12 1401   Facility-Administered Medications Ordered in Other Encounters  Medication Dose Route Frequency Provider Last Rate Last Dose  . [DISCONTINUED] ePHEDrine injection    PRN Garen Lah, CRNA   15 mg at 02/04/12 2004  . [DISCONTINUED] fentaNYL (SUBLIMAZE) injection    PRN Garen Lah, CRNA   100 mcg at 02/04/12 1939  . [DISCONTINUED] lactated ringers infusion    Continuous PRN Garen Lah, CRNA      . [DISCONTINUED] lidocaine (cardiac) 100 mg/61ml (XYLOCAINE) 20 MG/ML injection 2%    PRN Garen Lah, CRNA  100 mg at 02/04/12 1939  . [DISCONTINUED] Lidocaine HCl 4 % topical solution    PRN Garen Lah, CRNA   4 mL at 02/04/12 1939  . [DISCONTINUED] ondansetron (ZOFRAN) injection    PRN Garen Lah, CRNA   4 mg at 02/04/12 1958  . [DISCONTINUED] propofol (DIPRIVAN) 10 mg/mL bolus/IV push    PRN Garen Lah, CRNA   200 mg at 02/04/12 1939  . [DISCONTINUED] succinylcholine (ANECTINE) injection    PRN Garen Lah, CRNA   100 mg at 02/04/12 1939     Imaging: No results found.  Significant Diagnostic Studies: CBC Lab Results  Component Value Date   WBC 6.0 02/05/2012   HGB 9.1* 02/05/2012   HCT 28.9* 02/05/2012   MCV 70.5* 02/05/2012   PLT 242 02/05/2012    BMET    Component Value Date/Time   NA 136 02/05/2012 0900   K 4.1 02/05/2012 0900   CL 100 02/05/2012 0900   CO2 25 02/05/2012 0900    GLUCOSE 205* 02/05/2012 0900   BUN 29* 02/05/2012 0900   CREATININE 2.51* 02/05/2012 0900   CALCIUM 8.2* 02/05/2012 0900   GFRNONAA 22* 02/05/2012 0900   GFRAA 25* 02/05/2012 0900    COAG Lab Results  Component Value Date   INR 1.01 07/20/2011   INR 1.02 06/22/2011   INR 1.01 11/21/2010   No results found for this basename: PTT     Physical Examination BP Readings from Last 3 Encounters:  02/05/12 136/71  02/05/12 136/71  01/26/12 178/80   Temp Readings from Last 3 Encounters:  02/05/12 98.6 F (37 C) Oral  02/05/12 98.6 F (37 C) Oral  01/26/12 98.3 F (36.8 C) Oral   SpO2 Readings from Last 3 Encounters:  02/05/12 100%  02/05/12 100%  01/26/12 100%   Pulse Readings from Last 3 Encounters:  02/05/12 78  02/05/12 78  01/26/12 87    General:  WDWN in NAD HENT: WNL Eyes: Pupils equal Pulmonary: normal non-labored breathing , without Rales, rhonchi,  wheezing Cardiac: RRR, without  Murmurs, rubs or gallops; Skin: no rashes, ulcers noted Vascular Exam/Pulses:2+ radial pulses Speech is fluent/normal   ASSESSMENT/PLAN: Placement of Hickman catheter today by Dr. Arbie Cookey I have examined the patient, reviewed and agree with above.  Kamsiyochukwu Spickler, MD 02/05/2012 1:55 PM

## 2012-02-06 LAB — BASIC METABOLIC PANEL
BUN: 28 mg/dL — ABNORMAL HIGH (ref 6–23)
Chloride: 100 mEq/L (ref 96–112)
Creatinine, Ser: 2.55 mg/dL — ABNORMAL HIGH (ref 0.50–1.10)
GFR calc Af Amer: 24 mL/min — ABNORMAL LOW (ref >90)
GFR calc non Af Amer: 21 mL/min — ABNORMAL LOW (ref >90)

## 2012-02-06 LAB — GLUCOSE, CAPILLARY
Glucose-Capillary: 175 mg/dL — ABNORMAL HIGH (ref 70–99)
Glucose-Capillary: 94 mg/dL (ref 70–99)
Glucose-Capillary: 98 mg/dL (ref 70–99)
Glucose-Capillary: 130 mg/dL — ABNORMAL HIGH (ref 70–99)
Glucose-Capillary: 128 mg/dL — ABNORMAL HIGH (ref 70–99)

## 2012-02-06 LAB — CBC
MCHC: 31.7 g/dL (ref 30.0–36.0)
MCV: 70.2 fL — ABNORMAL LOW (ref 78.0–100.0)
Platelets: 254 10*3/uL (ref 150–400)
RDW: 14.2 % (ref 11.5–15.5)
WBC: 5.5 10*3/uL (ref 4.0–10.5)

## 2012-02-06 MED ORDER — SODIUM CHLORIDE 0.9 % IJ SOLN
10.0000 mL | INTRAMUSCULAR | Status: DC | PRN
  Administered 2012-02-06 – 2012-02-09 (×6): 10 mL

## 2012-02-06 NOTE — Progress Notes (Signed)
Subjective: Given her kidney function, she was unable to get a PICC, but had a R IJ Hickman placed yesterday.  Feels ok, has not eaten breakfast yet.  Objective: Vital signs in last 24 hours: Temp:  [97 F (36.1 C)-98.7 F (37.1 C)] 98.3 F (36.8 C) (12/07 0232) Pulse Rate:  [70-79] 77  (12/07 0232) Resp:  [12-17] 16  (12/07 0232) BP: (120-138)/(64-99) 120/99 mmHg (12/07 0232) SpO2:  [94 %-100 %] 98 % (12/07 0232) Weight change:  Last BM Date: 02/02/12  Intake/Output from previous day: 12/06 0701 - 12/07 0700 In: 800 [P.O.:600; I.V.:200] Out: 855 [Urine:850; Blood:5] Intake/Output this shift:   General appearance: alert and no distress  Eyes: no scleral icterus  Throat: oropharynx moist without erythema  Resp: clear to auscultation bilaterally  Cardio: regular rate and rhythm, S1, S2 normal, no murmur, click, rub or gallop  GI: soft, non-tender; bowel sounds normal; no masses, no organomegaly  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    Lab Results:  Basename 02/06/12 0715 02/05/12 0900  WBC 5.5 6.0  HGB 8.8* 9.1*  HCT 27.8* 28.9*  PLT 254 242   BMET  Basename 02/05/12 0900 02/04/12 0520  NA 136 138  K 4.1 3.8  CL 100 101  CO2 25 25  GLUCOSE 205* 156*  BUN 29* 34*  CREATININE 2.51* 2.28*  CALCIUM 8.2* 8.6    Studies/Results: Dg Chest Port 1 View  02/05/2012  *RADIOLOGY REPORT*  Clinical Data: Internal jugular catheter placement  PORTABLE CHEST - 1 VIEW  Comparison: 11/21/2010  Findings: There is a new right internal jugular central venous catheter that has its tip in the lower superior vena cava.  There is no pneumothorax.  The lungs are clear.  There are changes from a prior median sternotomy, stable.  The cardiac silhouette is normal in size.  No mediastinal or hilar masses or adenopathy.  IMPRESSION: New right internal jugular central venous catheter has its tip in the lower superior vena cava, well positioned.  No  pneumothorax. No acute cardiopulmonary disease.   Original Report Authenticated By: Amie Portland, M.D.     Medications:  I have reviewed the patient's current medications. Scheduled:   . aspirin EC  81 mg Oral Daily  . atorvastatin  40 mg Oral QHS  . carvedilol  6.25 mg Oral BID  . Chlorhexidine Gluconate Cloth  6 each Topical Daily  . cholecalciferol  5,000 Units Oral Daily  . enoxaparin (LOVENOX) injection  70 mg Subcutaneous Q24H  . escitalopram  20 mg Oral QPC breakfast  . felodipine  5 mg Oral QPC breakfast  . ferrous sulfate  325 mg Oral TID PC  . fluticasone  1 spray Each Nare Daily  . furosemide  80 mg Oral BID  . insulin pump   Subcutaneous Q4H  . levothyroxine  250 mcg Oral QAC breakfast  . loratadine  10 mg Oral Daily  . losartan  50 mg Oral Daily  . multivitamin with minerals  1 tablet Oral Daily  . mupirocin ointment  1 application Nasal BID  . omega-3 acid ethyl esters  2 g Oral Daily  . pantoprazole  20 mg Oral Q breakfast  . piperacillin-tazobactam (ZOSYN)  IV  3.375 g Intravenous Q8H  . sodium chloride  3 mL Intravenous Q12H  . vancomycin  1,500 mg Intravenous Q24H   Continuous:  ZOX:WRUEAV chloride, acetaminophen, acetaminophen, HYDROcodone-acetaminophen, HYDROmorphone (DILAUDID) injection, sodium chloride, [DISCONTINUED] 0.9 % irrigation (POUR  BTL), [DISCONTINUED] heparin 6000 unit irrigation, [DISCONTINUED] heparin, [DISCONTINUED]  HYDROmorphone (DILAUDID) injection, [DISCONTINUED] lidocaine-EPINEPHrine, [DISCONTINUED] meperidine (DEMEROL) injection, [DISCONTINUED] oxyCODONE, [DISCONTINUED] oxyCODONE [DISCONTINUED] promethazine  Assessment/Plan: Principal Problem:  1. *Right BKA infection- Pt is post op day  from uncomplicated abscess drainage and wound debridement of R BKA stump. Continue empiric Vanc/Zosyn that is being managed by pharmacy. Pt will need 4 weeks abx after discharge. Had Hickman placed yesterday.  2. Diabetes mellitus type 1, uncontrolled with  neurologic/renal complications- fluctuating sugars. Basal rates increased from 2.9 to 3.0 units during this hospitalization, but she suffered from low AM sugars, so decreased basal back to 2.9 from 12am-6am. Will continue to monitor.  3. 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 Increases risk of ulceration and infections.  4. HYPERTENSION- BP stable over past 24 hours. Continue home regimen and monitor.  5. CKD Stage IV-  GFR stable at 30.  6. Disposition- will likely need 4 weeks IV abx after discharge. Now has Hickman to facilitate this. Monitor over weekend on abx. Care management is working on getting IV abx after discharge. Likely d/c Monday w/ Dr Charlann Boxer f/u in 2 weeks   LOS: 5 days   Linder Prajapati W 02/06/2012, 8:45 AM

## 2012-02-06 NOTE — Progress Notes (Signed)
Subjective: 1 Day Post-Op Procedure(s) (LRB): INSERTION HICKMAN CATHETER (N/A) POD 1 Sharp incisional debridement of a right leg abscess using a  scalpel measured approximately 8-10 cm including skin, subcutaneous  tissue deep with placement of Hemovac drain.  Patient reports pain as mild.   She is sitting up in bed eating breakfast. Denies any SOB, CP, Nausea or Vomiting.   Objective: Vital signs in last 24 hours: Temp:  [97 F (36.1 C)-98.7 F (37.1 C)] 98.3 F (36.8 C) (12/07 0232) Pulse Rate:  [70-79] 77  (12/07 0232) Resp:  [12-17] 16  (12/07 0232) BP: (120-138)/(64-99) 120/99 mmHg (12/07 0232) SpO2:  [94 %-100 %] 98 % (12/07 0232)  Intake/Output from previous day: 12/06 0701 - 12/07 0700 In: 800 [P.O.:600; I.V.:200] Out: 855 [Urine:850; Blood:5]  Basename 02/06/12 0715 02/05/12 0900 02/04/12 0520  HGB 8.8* 9.1* 9.9*    Basename 02/06/12 0715 02/05/12 0900  WBC 5.5 6.0  RBC 3.96 4.10  HCT 27.8* 28.9*  PLT 254 242    Basename 02/05/12 0900 02/04/12 0520  NA 136 138  K 4.1 3.8  CL 100 101  CO2 25 25  BUN 29* 34*  CREATININE 2.51* 2.28*  GLUCOSE 205* 156*  CALCIUM 8.2* 8.6   No results found for this basename: LABPT:2,INR:2 in the last 72 hours  EXAM Compartment soft Stump dressing intact.  Leave alone for now. Hemovac still in place and continues to drain.  Will leave in for now. Continue to monitor and record output. Possibly remove drain tomorrow if output decreases.   Assessment/Plan: 1 Day Post-Op Procedure(s) (LRB): INSERTION HICKMAN CATHETER (N/A) POD 1 Sharp incisional debridement of a right leg abscess using a  scalpel measured approximately 8-10 cm including skin, subcutaneous  tissue deep with placement of Hemovac drain.  Plan is for 4 weeks of IV antibiotics.   Keep thru weekend. Possible discharge Monday as per notes in chart.  Wyatt Galvan 02/06/2012, 9:00 AM

## 2012-02-07 LAB — CBC
HCT: 27.9 % — ABNORMAL LOW (ref 36.0–46.0)
Hemoglobin: 8.9 g/dL — ABNORMAL LOW (ref 12.0–15.0)
MCH: 22.5 pg — ABNORMAL LOW (ref 26.0–34.0)
MCHC: 31.9 g/dL (ref 30.0–36.0)
MCV: 70.5 fL — ABNORMAL LOW (ref 78.0–100.0)
Platelets: 263 10*3/uL (ref 150–400)
RBC: 3.96 MIL/uL (ref 3.87–5.11)
RDW: 14.2 % (ref 11.5–15.5)
WBC: 5.7 10*3/uL (ref 4.0–10.5)

## 2012-02-07 LAB — GLUCOSE, CAPILLARY
Glucose-Capillary: 113 mg/dL — ABNORMAL HIGH (ref 70–99)
Glucose-Capillary: 54 mg/dL — ABNORMAL LOW (ref 70–99)
Glucose-Capillary: 121 mg/dL — ABNORMAL HIGH (ref 70–99)
Glucose-Capillary: 251 mg/dL — ABNORMAL HIGH (ref 70–99)
Glucose-Capillary: 246 mg/dL — ABNORMAL HIGH (ref 70–99)
Glucose-Capillary: 277 mg/dL — ABNORMAL HIGH (ref 70–99)

## 2012-02-07 LAB — BASIC METABOLIC PANEL
CO2: 28 mEq/L (ref 19–32)
Calcium: 9.1 mg/dL (ref 8.4–10.5)
GFR calc non Af Amer: 20 mL/min — ABNORMAL LOW (ref >90)
Potassium: 3.7 mEq/L (ref 3.5–5.1)
Sodium: 137 mEq/L (ref 135–145)

## 2012-02-07 LAB — VANCOMYCIN, TROUGH: Vancomycin Tr: 21.4 ug/mL — ABNORMAL HIGH (ref 10.0–20.0)

## 2012-02-07 MED ORDER — VANCOMYCIN HCL 10 G IV SOLR
1500.0000 mg | INTRAVENOUS | Status: DC
  Administered 2012-02-09: 1500 mg via INTRAVENOUS
  Filled 2012-02-07: qty 1500

## 2012-02-07 NOTE — Progress Notes (Signed)
Subjective: 2 Days Post-Op Procedure(s) (LRB): INSERTION HICKMAN CATHETER (N/A) Patient reports pain as mild.   Patient seen in rounds with Dr.Gioffre. Patient is well, and has had no acute complaints or problems. No issues overnight. She reports that her leg is feeling a little better today than yesterday. Denies shortness of breath and chest pain.   Objective: Vital signs in last 24 hours: Temp:  [98.7 F (37.1 C)-99.3 F (37.4 C)] 98.9 F (37.2 C) (12/08 0542) Pulse Rate:  [79-90] 90  (12/08 0542) Resp:  [16-18] 18  (12/08 0542) BP: (104-137)/(35-71) 104/35 mmHg (12/08 0542) SpO2:  [98 %-99 %] 98 % (12/08 0542)  Intake/Output from previous day:  Intake/Output Summary (Last 24 hours) at 02/07/12 0951 Last data filed at 02/07/12 1610  Gross per 24 hour  Intake    490 ml  Output     10 ml  Net    480 ml    Intake/Output this shift: Total I/O In: 10 [I.V.:10] Out: -   Labs:  Basename 02/07/12 0535 02/06/12 0715 02/05/12 0900  HGB 8.9* 8.8* 9.1*    Basename 02/07/12 0535 02/06/12 0715  WBC 5.7 5.5  RBC 3.96 3.96  HCT 27.9* 27.8*  PLT 263 254    Basename 02/07/12 0535 02/06/12 0715  NA 137 137  K 3.7 3.9  CL 100 100  CO2 28 25  BUN 29* 28*  CREATININE 2.64* 2.55*  GLUCOSE 83 105*  CALCIUM 9.1 8.6    EXAM General - Patient is Alert and Oriented Extremity - Neurologically intact Neurovascular intact No cellulitis present Dressing/Incision - clean, scant bloody drainage   Past Medical History  Diagnosis Date  . Hypertension   . Hypothyroidism   . Diabetic retinopathy(362.0)   . Charcot ankle     AND FOOT - LEFT  . Osteomyelitis of foot 1993    PT REQUIRED BELOW THE KNEE AMPUTATION   . Blood transfusion     "multiple" (02/01/2012)  . GERD (gastroesophageal reflux disease)     PT WOULD TAKE PROTONIX IF HAVING GERD  . Depression   . Anxiety   . Sleep apnea     PT STATES SLEEP STUDY ABOUT 13 YRS AGO-TOLD SHE HAD SLEEP APNEA BUT NEVER GIVEN CPAP   . Hypercholesteremia   . SVT (supraventricular tachycardia) 2002    PAST HX SVT--HAD HEART ABLATION 2002--NO PROBLEMS SINCE  . Exertional dyspnea     "lately" (02/01/2012)  . Hashimoto thyroiditis, fibrous variant 1997  . Type 1 diabetes     PT HAS INSULIN PUMP-DR. ALTHEIMER MANAGES PT'S PUMP  . Thalassemia minor   . Iron deficiency anemia   . Headache     2010--DX WITH CLUSTER H/A'S -JUST OCCAS BAD HEADACHE NOW  . Sinus headache   . Migraine     "q couple years" (02/01/2012)  . DJD (degenerative joint disease) of hip     HX OF DJD LEFT HIP (S/P LEFT TOTAL HIP ARTHROPLASTY )AND ARTHRITIS LEFT ANKLE  . Arthritis     "shoulders; left ankle, foot, knee" (02/01/2012)  . Chronic kidney disease, stage 2 (mild)     Assessment/Plan: 2 Days Post-Op Procedure(s) (LRB): INSERTION HICKMAN CATHETER (N/A) Principal Problem:  *Chronic kidney disease, stage III (moderate) Active Problems:  Diabetes mellitus type 1, uncontrolled  POLYNEUROPATHY  HYPERTENSION  Right BKA infection  Estimated Body mass index is 42.24 kg/(m^2) as calculated from the following:   Height as of this encounter: 6\' 2" (1.88 m).   Weight as of this  encounter: 329 lb(149.233 kg).  Advance diet Possible discharge home tomorrow in Iv antibiotics Hemovac pulled and dressing changed. Reinforce dressing as needed  Taylah Dubiel LAUREN 02/07/2012, 9:51 AM

## 2012-02-07 NOTE — Progress Notes (Signed)
ANTIBIOTIC CONSULT NOTE - FOLLOW UP  Pharmacy Consult for Vancomycin Indication: cellulitis with abscess  Allergies  Allergen Reactions  . Fish Allergy Anaphylaxis and Itching    "I can eat tuna, shrimp, salmon; can't eat anything else" (02/01/2012)  . Travatan (Travoprost) Itching  . Other Itching    Grapes "my throat itches" (02/01/2012)    Patient Measurements: Height: 6\' 2"  (188 cm) Weight: 329 lb (149.233 kg) IBW/kg (Calculated) : 77.7    Vital Signs: Temp: 98.3 F (36.8 C) (12/08 1400) Temp src: Oral (12/08 1400) BP: 106/55 mmHg (12/08 1400) Pulse Rate: 83  (12/08 1400) Intake/Output from previous day: 12/07 0701 - 12/08 0700 In: 720 [P.O.:720] Out: 50 [Drains:50] Intake/Output from this shift: Total I/O In: 620 [P.O.:600; I.V.:20] Out: 2000 [Urine:2000]  Labs:  [facility] 02/07/12 0535 02/06/12 0715 02/05/12 0900  WBC 5.7 5.5 6.0  HGB 8.9* 8.8* 9.1*  PLT 263 254 242  LABCREA -- -- --  CREATININE 2.64* 2.55* 2.51*   Estimated Creatinine Clearance: 43.7 ml/min (by C-G formula based on Cr of 2.64).  Basename 02/07/12 1716  VANCOTROUGH 21.4*  VANCOPEAK --  Drue Dun --  GENTTROUGH --  GENTPEAK --  GENTRANDOM --  TOBRATROUGH --  TOBRAPEAK --  TOBRARND --  AMIKACINPEAK --  AMIKACINTROU --  AMIKACIN --     Microbiology: Recent Results (from the past 720 hour(s))  SURGICAL PCR SCREEN     Status: Abnormal   Collection Time   02/04/12  6:56 PM      Component Value Range Status Comment   MRSA, PCR NEGATIVE  NEGATIVE Final    Staphylococcus aureus POSITIVE (*) NEGATIVE Final   CULTURE, ROUTINE-ABSCESS     Status: Normal (Preliminary result)   Collection Time   02/04/12  8:07 PM      Component Value Range Status Comment   Specimen Description ABSCESS LEG RIGHT   Final    Special Requests NONE   Final    Gram Stain     Final    Value: RARE WBC PRESENT, PREDOMINANTLY PMN     FEW SQUAMOUS EPITHELIAL CELLS PRESENT     NO ORGANISMS SEEN   Culture NO  GROWTH 2 DAYS   Final    Report Status PENDING   Incomplete   ANAEROBIC CULTURE     Status: Normal (Preliminary result)   Collection Time   02/04/12  8:07 PM      Component Value Range Status Comment   Specimen Description ABSCESS LEG RIGHT   Final    Special Requests NONE   Final    Gram Stain     Final    Value: RARE WBC PRESENT, PREDOMINANTLY PMN     FEW SQUAMOUS EPITHELIAL CELLS PRESENT     NO ORGANISMS SEEN   Culture     Final    Value: NO ANAEROBES ISOLATED; CULTURE IN PROGRESS FOR 5 DAYS   Report Status PENDING   Incomplete      Assessment: 46 yr old female on Vancomycin day #7  For RLE cellulitis with abscess (MRI confirmed, no osteo).  Her vancomycin trough is 21.4 mcg/ml today on vancomycin 1500 mg IV q24.  Previously her trough was 22.4 on vancomycin  2000 mg IV q24.  Her trough is higher than the goal of 10 - 15 mcg/ml for cellulitis.  Her WBC is 5.7.  Her creatinine is 2.65 and her creat cl is ~ 44 ml/min. She is going home on IV abx.   Goal of Therapy:  Vancomycin trough level 10-15 mcg/ml  Plan:  1. Change to vancomycin 1500 mg IV q48 hrs for convenience of outpt dosing - next dose due 12/10 - could give at 12 noon or 1800. Herby Abraham, Pharm.D. [phone] 02/07/2012 6:54 PM

## 2012-02-07 NOTE — Progress Notes (Signed)
Pt 8 am CBG 54. 2 cups of orange juice given. Pt was getting ready to eat breakfast. Pt ate 100 % of meal. CBG rechecked. Now 121. Will continue to monitor. Dion Saucier

## 2012-02-07 NOTE — Progress Notes (Signed)
Subjective: Doing ok, sugars flat until a low in the 50's at 7:30 this morning.  Her glucose on 5:30 labs was in the 80's.  No new issues yesterday.  Objective: Vital signs in last 24 hours: Temp:  [98.7 F (37.1 C)-99.3 F (37.4 C)] 98.9 F (37.2 C) (12/08 0542) Pulse Rate:  [77-90] 90  (12/08 0542) Resp:  [16-18] 18  (12/08 0542) BP: (104-137)/(35-71) 104/35 mmHg (12/08 0542) SpO2:  [98 %-99 %] 98 % (12/08 0542) Weight change:  Last BM Date: 02/02/12  Intake/Output from previous day: 12/07 0701 - 12/08 0700 In: 720 [P.O.:720] Out: 50 [Drains:50] Intake/Output this shift: Total I/O In: 10 [I.V.:10] Out: -  General appearance: alert and no distress  Eyes: no scleral icterus  Throat: oropharynx moist without erythema  Resp: clear to auscultation bilaterally  Cardio: regular rate and rhythm, S1, S2 normal, no murmur, click, rub or gallop  GI: soft, non-tender; bowel sounds normal; no masses, no organomegaly  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    Lab Results:  [facility] 02/07/12 0535 02/06/12 0715  WBC 5.7 5.5  HGB 8.9* 8.8*  HCT 27.9* 27.8*  PLT 263 254   BMET  Basename 02/07/12 0535 02/06/12 0715  NA 137 137  K 3.7 3.9  CL 100 100  CO2 28 25  GLUCOSE 83 105*  BUN 29* 28*  CREATININE 2.64* 2.55*  CALCIUM 9.1 8.6    Studies/Results: Dg Chest Port 1 View  02/05/2012  *RADIOLOGY REPORT*  Clinical Data: Internal jugular catheter placement  PORTABLE CHEST - 1 VIEW  Comparison: 11/21/2010  Findings: There is a new right internal jugular central venous catheter that has its tip in the lower superior vena cava.  There is no pneumothorax.  The lungs are clear.  There are changes from a prior median sternotomy, stable.  The cardiac silhouette is normal in size.  No mediastinal or hilar masses or adenopathy.  IMPRESSION: New right internal jugular central venous catheter has its tip in the lower superior vena cava, well positioned.   No pneumothorax. No acute cardiopulmonary disease.   Original Report Authenticated By: Amie Portland, M.D.     Medications:  I have reviewed the patient's current medications. Scheduled:   . aspirin EC  81 mg Oral Daily  . atorvastatin  40 mg Oral QHS  . carvedilol  6.25 mg Oral BID  . Chlorhexidine Gluconate Cloth  6 each Topical Daily  . cholecalciferol  5,000 Units Oral Daily  . enoxaparin (LOVENOX) injection  70 mg Subcutaneous Q24H  . escitalopram  20 mg Oral QPC breakfast  . felodipine  5 mg Oral QPC breakfast  . ferrous sulfate  325 mg Oral TID PC  . fluticasone  1 spray Each Nare Daily  . furosemide  80 mg Oral BID  . insulin pump   Subcutaneous Q4H  . levothyroxine  250 mcg Oral QAC breakfast  . loratadine  10 mg Oral Daily  . losartan  50 mg Oral Daily  . multivitamin with minerals  1 tablet Oral Daily  . mupirocin ointment  1 application Nasal BID  . omega-3 acid ethyl esters  2 g Oral Daily  . pantoprazole  20 mg Oral Q breakfast  . piperacillin-tazobactam (ZOSYN)  IV  3.375 g Intravenous Q8H  . sodium chloride  3 mL Intravenous Q12H  . vancomycin  1,500 mg Intravenous Q24H   Continuous:  JXB:JYNWGN chloride, acetaminophen, acetaminophen, HYDROcodone-acetaminophen, HYDROmorphone (DILAUDID) injection, sodium  chloride, sodium chloride  Assessment/Plan: Principal Problem:  1. *Right BKA infection- Pt is post op day from uncomplicated abscess drainage and wound debridement of R BKA stump. Continue empiric Vanc/Zosyn that is being managed by pharmacy. Pt will need 4 weeks abx after discharge. Had Hickman placed yesterday.  2. Diabetes mellitus type 1, uncontrolled with neurologic/renal complications- fluctuating sugars. She is now getting 2.9units/hour until 5am, when she will drop to 2.6, and then back to 3 units at 8am.  I personally made the changes in her pump.  If low again, may need to reduce the 2.9 at MN further. 3. 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 Increases risk  of ulceration and infections.  4. HYPERTENSION- BP stable over past 24 hours. Continue home regimen and monitor.  5. CKD Stage IV- GFR stable at 30.  6. Disposition- will likely need 4 weeks IV abx after discharge. Now has Hickman to facilitate this. Monitor over weekend on abx. Care management is working on getting IV abx after discharge. Likely d/c Monday w/ Dr Charlann Boxer f/u in 2 weeks   LOS: 6 days   Jazmyne Beauchesne W 02/07/2012, 9:17 AM

## 2012-02-08 LAB — GLUCOSE, CAPILLARY
Glucose-Capillary: 97 mg/dL (ref 70–99)
Glucose-Capillary: 56 mg/dL — ABNORMAL LOW (ref 70–99)
Glucose-Capillary: 211 mg/dL — ABNORMAL HIGH (ref 70–99)
Glucose-Capillary: 109 mg/dL — ABNORMAL HIGH (ref 70–99)

## 2012-02-08 LAB — BASIC METABOLIC PANEL
Calcium: 9 mg/dL (ref 8.4–10.5)
GFR calc non Af Amer: 21 mL/min — ABNORMAL LOW (ref >90)
Sodium: 139 mEq/L (ref 135–145)

## 2012-02-08 LAB — CBC
MCH: 22.2 pg — ABNORMAL LOW (ref 26.0–34.0)
MCHC: 31.7 g/dL (ref 30.0–36.0)
Platelets: 286 10*3/uL (ref 150–400)
RDW: 14.2 % (ref 11.5–15.5)

## 2012-02-08 LAB — CULTURE, ROUTINE-ABSCESS: Culture: NO GROWTH

## 2012-02-08 LAB — PHOSPHORUS: Phosphorus: 4.3 mg/dL (ref 2.3–4.6)

## 2012-02-08 LAB — MAGNESIUM: Magnesium: 2 mg/dL (ref 1.5–2.5)

## 2012-02-08 MED ORDER — LOSARTAN POTASSIUM 25 MG PO TABS
25.0000 mg | ORAL_TABLET | Freq: Every day | ORAL | Status: DC
  Administered 2012-02-08 – 2012-02-09 (×2): 25 mg via ORAL
  Filled 2012-02-08 (×2): qty 1

## 2012-02-08 MED ORDER — FUROSEMIDE 40 MG PO TABS
40.0000 mg | ORAL_TABLET | Freq: Every day | ORAL | Status: DC
  Administered 2012-02-08 – 2012-02-09 (×2): 40 mg via ORAL
  Filled 2012-02-08 (×2): qty 1

## 2012-02-08 MED ORDER — INSULIN PUMP
Freq: Three times a day (TID) | SUBCUTANEOUS | Status: DC
  Administered 2012-02-09: 14.8 via SUBCUTANEOUS
  Filled 2012-02-08: qty 1

## 2012-02-08 NOTE — Progress Notes (Signed)
Patient ID: [name], female   DOB: [birth year], 46 y.o.   MRN: [id number] Subjective: 4 Days Post-Op Procedure(s) (LRB): I&D right leg abscess    Patient reports pain as mild.  Doing fine, ready to go home  Objective:   VITALS:   Filed Vitals:   02/08/12 0526  BP: 132/66  Pulse: 77  Temp: 98 F (36.7 C)  Resp: 18    Incision: scant drainage, right leg stump soft, no fluctuance, no fullness, minimal bleeding from incision site  LABS  Basename 02/07/12 0535 02/06/12 0715 02/05/12 0900  HGB 8.9* 8.8* 9.1*  HCT 27.9* 27.8* 28.9*  WBC 5.7 5.5 6.0  PLT 263 254 242     Basename 02/07/12 0535 02/06/12 0715 02/05/12 0900  NA 137 137 136  K 3.7 3.9 4.1  BUN 29* 28* 29*  CREATININE 2.64* 2.55* 2.51*  GLUCOSE 83 105* 205*    No results found for this basename: LABPT:2,INR:2 in the last 72 hours   Assessment/Plan: POD#4 s/p I&D right leg abscess   Continue ABX therapy due to right leg soft tissue abscess for 4 weeks total Return to clinic [facility], [phone], in 2 weeks for wound check May wear stump socks over dressed wound Should minimize use of prosthetic limb during period of healing to prevent/minimze excessive pressure on wound Call office with questions

## 2012-02-08 NOTE — Progress Notes (Signed)
Subjective: Hypoglycemic again this AM. Will need to decrease 0000-0600 dose again. No complaints. Ready for d/c tomorrow  Objective: Vital signs in last 24 hours: Temp:  [98 F (36.7 C)-98.3 F (36.8 C)] 98 F (36.7 C) (12/09 0526) Pulse Rate:  [76-83] 77  (12/09 0526) Resp:  [18-20] 18  (12/09 0526) BP: (106-138)/(55-66) 132/66 mmHg (12/09 0526) SpO2:  [98 %-99 %] 99 % (12/09 0526) Weight change:  Last BM Date: 02/07/12  Intake/Output from previous day: 12/08 0701 - 12/09 0700 In: 1195 [P.O.:600; I.V.:595] Out: 2000 [Urine:2000] Intake/Output this shift:   General appearance: alert and no distress  Eyes: no scleral icterus  Throat: oropharynx moist without erythema  Resp: clear to auscultation bilaterally  Cardio: regular rate and rhythm, S1, S2 normal, no murmur, click, rub or gallop  GI: soft, non-tender; bowel sounds normal; no masses, no organomegaly  Extremities: 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 No edema on LLE  Lab Results:  [facility] 02/08/12 0610 02/07/12 0535  WBC 5.8 5.7  HGB 8.6* 8.9*  HCT 27.1* 27.9*  PLT 286 263   BMET  Basename 02/08/12 0610 02/07/12 0535  NA 139 137  K 3.8 3.7  CL 102 100  CO2 27 28  GLUCOSE 71 83  BUN 29* 29*  CREATININE 2.55* 2.64*  CALCIUM 9.0 9.1    Studies/Results: No results found.  Medications:  I have reviewed the patient's current medications. Scheduled:    . aspirin EC  81 mg Oral Daily  . atorvastatin  40 mg Oral QHS  . carvedilol  6.25 mg Oral BID  . Chlorhexidine Gluconate Cloth  6 each Topical Daily  . cholecalciferol  5,000 Units Oral Daily  . enoxaparin (LOVENOX) injection  70 mg Subcutaneous Q24H  . escitalopram  20 mg Oral QPC breakfast  . felodipine  5 mg Oral QPC breakfast  . ferrous sulfate  325 mg Oral TID PC  . fluticasone  1 spray Each Nare Daily  . furosemide  40 mg Oral Daily  . insulin pump   Subcutaneous Q4H  . levothyroxine  250 mcg Oral QAC breakfast  . loratadine  10 mg Oral Daily  .  losartan  25 mg Oral Daily  . multivitamin with minerals  1 tablet Oral Daily  . mupirocin ointment  1 application Nasal BID  . omega-3 acid ethyl esters  2 g Oral Daily  . pantoprazole  20 mg Oral Q breakfast  . piperacillin-tazobactam (ZOSYN)  IV  3.375 g Intravenous Q8H  . sodium chloride  3 mL Intravenous Q12H  . vancomycin  1,500 mg Intravenous Q48H  . [DISCONTINUED] furosemide  80 mg Oral BID  . [DISCONTINUED] losartan  50 mg Oral Daily  . [DISCONTINUED] vancomycin  1,500 mg Intravenous Q24H   Continuous:  ZOX:WRUEAV chloride, acetaminophen, acetaminophen, HYDROcodone-acetaminophen, HYDROmorphone (DILAUDID) injection, sodium chloride, sodium chloride  Assessment/Plan: Principal Problem:  1. *Right BKA infection- s/p uncomplicated abscess drainage and wound debridement of R BKA stump. Continue empiric Vanc/Zosyn that is being managed by pharmacy. Pt will need total of 4 weeks abx after discharge. Had Hickman placed for outpatient antibiotics 2. Diabetes mellitus type 1, uncontrolled with neurologic/renal complications- fluctuating sugars. She is now getting 2.6units/hour from 0000 to 0700. She will then get 3U/hr from 0700 to 0000.  I personally made the changes in her pump.  3. 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 Increases risk of ulceration and infections.  4. HYPERTENSION- BP stable over past 24 hours. We will decrease her losartan from 50mg   daily to 25mg  daily 2/2 decrease in GFR  5. CKD Stage IV- GFR has been dropping slightly since admission. Pt saw Dr Kathrene Bongo 2 weeks ago and was noted to have increased LE edema so started on lasix 80mg  BID. She was told her GFR was 35-40 at her last nephrology visit, but her GFR has been ~25 while in house. Will decrease her lasix to 40mg  daily given euvolemic state and her losartan to 25mg  daily. I will ensure she has nephrology f/u in the following month. She will also follow up w/ me in 1-2 weeks.  Her GFR has been around 25 persistently in  house. ESRD labs are normal.  6. Disposition- D/C home w/ [facility] tomorrow.  Have placed case manager consult for IV vanc and zosyn to be arranged for a noon discharge tomorrow. She will need abx until 12/30. She will need HH to come and help with line mantainance and antibiotic administration. Dr Charlann Boxer f/u in 2 weeks, patient has contact number and is aware to call the office. F/u in my clinic in 1 week. F/u nephrology in 1 month.    LOS: 7 days   Maymie Brunke 02/08/2012, 8:27 AM

## 2012-02-08 NOTE — Discharge Summary (Signed)
Physician Discharge Summary    [name]  MR#: [id number]  DOB:1965/06/28  Date of Admission: 02/01/2012 Date of Discharge: 02/09/2012  Attending Physician:Carmelite Violet  Patient's JXB:JYNWGNFA, Sydney Picket, MD  Consults: vascular surgery, orthopedic surgery, case management, social work   Discharge Diagnoses: Principal Problem:  *Right BKA infection Active Problems:  Diabetes mellitus type 1, uncontrolled  POLYNEUROPATHY  HYPERTENSION  Chronic kidney disease, stage III (moderate)   Discharge Medications:   Medication List     As of 02/09/2012  8:43 AM    STOP taking these medications         diphenhydrAMINE 25 mg capsule   Commonly known as: BENADRYL      OVER THE COUNTER MEDICATION      TAKE these medications         atorvastatin 40 MG tablet   Commonly known as: LIPITOR   Take 40 mg by mouth at bedtime.      carvedilol 6.25 MG tablet   Commonly known as: COREG   Take 6.25 mg by mouth 2 (two) times daily.      escitalopram 20 MG tablet   Commonly known as: LEXAPRO   Take 20 mg by mouth daily after breakfast.      felodipine 5 MG 24 hr tablet   Commonly known as: PLENDIL   Take 5 mg by mouth daily after breakfast.      ferrous sulfate 325 (65 FE) MG tablet   Take 1 tablet (325 mg total) by mouth 3 (three) times daily after meals.      furosemide 40 MG tablet   Commonly known as: LASIX   Take 1 tablet (40 mg total) by mouth daily.      insulin pump 100 unit/ml Soln   Inject 1 each into the skin See admin instructions. Patient is taking 2.25 units per hour.      levothyroxine 125 MCG tablet   Commonly known as: SYNTHROID, LEVOTHROID   Take 250 mcg by mouth daily before breakfast.      loratadine 10 MG tablet   Commonly known as: CLARITIN   Take 10 mg by mouth daily.      losartan 25 MG tablet   Commonly known as: COZAAR   Take 1 tablet (25 mg total) by mouth daily.      mometasone 50 MCG/ACT nasal spray   Commonly known as: NASONEX    Place 2 sprays into the nose daily as needed. Allergies        multivitamin with minerals Tabs   Take 1 tablet by mouth daily.      omega-3 acid ethyl esters 1 G capsule   Commonly known as: LOVAZA   Take 2 g by mouth daily.      pantoprazole 20 MG tablet   Commonly known as: PROTONIX   Take 20 mg by mouth daily with breakfast. ONCE A DAY AS NEEDED--ONLY IF HAVING REFLUX-DOES NOT TAKE EVERYDAY      piperacillin-tazobactam 3.375 GM/50ML IVPB   Commonly known as: ZOSYN   Inject 50 mLs (3.375 g total) into the vein every 8 (eight) hours.      sodium chloride 0.9 % SOLN 500 mL with vancomycin 10 G SOLR 1,500 mg   Inject 1,500 mg into the vein every other day.   Start taking on: 02/11/2012      triamcinolone cream 0.1 %   Commonly known as: KENALOG   Apply 1 application topically daily.      Vitamin D-3 5000 UNITS  Tabs   Take 5,000 Units by mouth daily.          Hospital Procedures: Mr Knee Right Wo Contrast  02/02/2012  *RADIOLOGY REPORT*  Clinical Data:  Swelling of the stump of the right BK amputation. Fever.  MRI OF THE RIGHT KNEE WITHOUT CONTRAST  Technique:  Multiplanar, multisequence MR imaging of the right knee was performed.  No intravenous contrast was administered.  Comparison:  Radiographs dated 05/04/2011  FINDINGS: There is a prominent soft tissue fluid collection measuring 8.7 x 5.8 x 2.9 cm lying anterior to the stump of the tibia and extending medially.  There is extensive edema in the adjacent soft tissues.  The findings are consistent with an abscess and cellulitis.  An inflamed pseudo bursa could give the same appearance.  There is no underlying myositis or osteomyelitis.  There is no knee joint effusion.  There is focal grade 4 chondromalacia of the medial central portion of the lateral tibial plateau.  Cruciate and collateral ligaments are normal.  Patellar tendon is normal. Menisci are intact.  IMPRESSION: Large subcutaneous fluid collection at the anterior  medial aspect of the distal stump consistent with an abscess with adjacent cellulitis.   Original Report Authenticated By: Francene Boyers, M.D.    Dg Chest Port 1 View  02/05/2012  *RADIOLOGY REPORT*  Clinical Data: Internal jugular catheter placement  PORTABLE CHEST - 1 VIEW  Comparison: 11/21/2010  Findings: There is a new right internal jugular central venous catheter that has its tip in the lower superior vena cava.  There is no pneumothorax.  The lungs are clear.  There are changes from a prior median sternotomy, stable.  The cardiac silhouette is normal in size.  No mediastinal or hilar masses or adenopathy.  IMPRESSION: New right internal jugular central venous catheter has its tip in the lower superior vena cava, well positioned.  No pneumothorax. No acute cardiopulmonary disease.   Original Report Authenticated By: Amie Portland, M.D.     History of Present Illness: Pt admitted for abscess of R stump  Hospital Course:  R stump abscess/cellulitis - MRI performed confirming subQ fluid collection and abscess formation. Dr Charlann Boxer was consulted w/ ortho. Pt was started empirically on vanc and zosyn. Pt never experienced leukocytosis or fevers. Pt was taken to the OR for sharp incisional debridement w/ drain placement on 02/04/12. This was completed w/o complication. Cultures were NGTD from the OR. Patient did well postop requiring minimal pain management. Dr Charlann Boxer recommends 4 weeks of total antibiotics, and 12/2 is noted as the start date w/ 02/29/12 being the projected end date. Vanc is dosed at 1500mg  QOD based on trough levels, with repeat trough done at home on 12/16 and faxed to my office. Goal vanc level is 15-20. Zosyn will be continued at 3.375 q8 hours. Given her CKD stage 3-4, nephrology declined having a PICC line placed, so a hickman catheter was placed by vascular surgery, Dr Arbie Cookey.  She has HH arranged w/ [facility] to assist w/ antibiotics and line care. I will see her in my office on the  final week of antibiotics to arrange for her hickman catheter to be removed by [facility] on [address]. Hgb remained stable throughout and was >9 at time of discharge.  CKD, stage 3-4 - on admission,  GFR was 31. On discharge GFR was 25, with it being 23 at its worst. She was recently evaluated by [facility] as an outpatient per her report (and told her GFR was  35-40 at that time). She was started at this outpatient visit on lasix 80mg  BID b/c she was noted to be volume overloaded. Since admission, she looks euvolemic and her GFR has been steadily declining. Her vanc levels have never been grossly elevated (did have one trough read at 21), her BP has remained controlled, and her Hgb has remained >8. I feel this kidney impairment is likely 2/2 medication effects and her lasix has been decreased to 40mg  daily and her cozaar has been decreased to 25mg  daily. I have arranged for her follow up w/ Dr Kathrene Bongo to be moved up to 02/19/12 for continued titration of lasix and evaluation of her worsening CKD. Her output remained stable on the lower dose of lasix   DM, type 1 - Insulin pump had to be changed several times 2/2 fluctuating blood sugars and AM hypoglycemia. Her basal rate is now 2.6U/Hr from 0000-0800 and 3U/hr from 0800-0000. On this regimen over the past 24 hours her FSBS were in the 100s w/ a low of 78 this AM. This is improved from the lows reaching into the 40s on the prior regimen. We will continue this regimen for her and follow up in the office. She voiced confidence that she will be able to afford her insulin after discharge.   HTN - remained controlled in the SBP of 120-140s on the lower dose of cozaar and lasix    Day of Discharge Exam BP 130/79  Pulse 85  Temp 98 F (36.7 C) (Oral)  Resp 16  Ht 6\' 2"  (1.88 m)  Wt 329 lb (149.233 kg)  BMI 42.24 kg/m2  SpO2 98%  Physical Exam: General appearance: AAF in NAD   Eyes: no scleral icterus. MMM Throat: oropharynx moist without  erythema Resp: CTAB. No wheezes or rales  Cardio: RRR. No MRG.  GI: soft, non-tender; bowel sounds normal; no masses,  no organomegaly Extremities: no clubbing, cyanosis or edema of LLE. RLE has BKA w/ bandage at stump and no signs of bleeding     Discharge Labs:  [facility] 02/09/12 0505 02/08/12 1006 02/08/12 0610  NA 138 -- 139  K 4.0 -- 3.8  CL 102 -- 102  CO2 27 -- 27  GLUCOSE 131* -- 71  BUN 31* -- 29*  CREATININE 2.48* -- 2.55*  CALCIUM 8.9 -- 9.0  MG -- 2.0 --  PHOS -- 4.3 --   No results found for this basename: AST:2,ALT:2,ALKPHOS:2,BILITOT:2,PROT:2,ALBUMIN:2 in the last 72 hours  Basename 02/09/12 0505 02/08/12 0610  WBC 6.1 5.8  NEUTROABS -- --  HGB 9.6* 8.6*  HCT 28.2* 27.1*  MCV 70.7* 69.8*  PLT 310 286   Lab Results  Component Value Date   INR 1.01 07/20/2011   INR 1.02 06/22/2011   INR 1.01 11/21/2010   No results found for this basename: CKTOTAL:3,CKMB:3,CKMBINDEX:3,TROPONINI:3 in the last 72 hours No results found for this basename: TSH,T4TOTAL,FREET3,T3FREE,THYROIDAB in the last 72 hours No results found for this basename: VITAMINB12:2,FOLATE:2,FERRITIN:2,TIBC:2,IRON:2,RETICCTPCT:2 in the last 72 hours  Discharge instructions: Discharge Orders    Future Orders Please Complete By Expires   Diet - low sodium heart healthy      Increase activity slowly      Comments:   Avoid using the prosthetic for RLE while on antibiotics   Discharge instructions      Comments:   Call clinic if severe bleeding, oozing, pain from the surgery sight. Call the office if any trouble with getting the antibiotics or trouble with the line.  Other Restrictions      Comments:   Avoid prosthetic while on antibiotics   Call MD for:  temperature >100.4      Call MD for:  persistant nausea and vomiting      Call MD for:  severe uncontrolled pain      Call MD for:  redness, tenderness, or signs of infection (pain, swelling, redness, odor or green/yellow discharge around  incision site)        06-Home-Health Care Svc Follow-up Information    Follow up with Shelda Pal, MD. In 2 weeks. (for wound check and suture removal)    Contact information:   [address] [place] [address] [phone]          Disposition: stable   Follow-up Appts: Follow-up with Dr. Link Snuffer at [facility]. You will be called by our office in the next 24 hours  Dr Kathrene Bongo w/ [facility] on 02/19/12 at 10:45  Call Dr Nilsa Nutting office for f/u in 2 weeks. Schedule an appt at [phone] Follow up w/ [facility] will be arranged   Condition on Discharge: stable   Tests Needing Follow-up: she will need BMP in follow up . F/u cultures . Vanc trough on 12/16 to be faxed to my office at [phone].   Time spent in discharge (includes decision making & examination of pt): 45 minutes    Signed: Rydge Texidor 02/09/2012, 8:43 AM

## 2012-02-08 NOTE — Progress Notes (Signed)
Contacted [facility] with the info below copied from my CM consult. They have already been working on this discharge.    We are aiming for noon discharge tomorrow after 10AM dose of vanc.  Her first dose of home vanc will be at noon on 12/11 and zosyn at [facility] on 12/10. She will need to have home antibiotic medications set up for vanc 1500mg  once every other day over 120 minutes and zosyn 3.375mg  once every 8 hours over 240 minutes. End date of antibiotics is 02/29/12.   She will need skilled nursing to assist with this at home.  She will also need home health for maintenance of her hickman single lumen catheter. Reason for consult:->Medication needs Reason for consult:->Equipment Reason for consult:->Home health needs

## 2012-02-08 NOTE — Progress Notes (Signed)
Inpatient Diabetes Program Recommendations  AACE/ADA: New Consensus Statement on Inpatient Glycemic Control (2013)  Target Ranges:  Prepandial:   less than 140 mg/dL      Peak postprandial:   less than 180 mg/dL (1-2 hours)      Critically ill patients:  140 - 180 mg/dL   Reason for Visit: Insulin pump  Note: Supportive visit at bedside.  Note changes in basal rates made this morning with physician.  Sevag Shearn S. Elsie Lincoln, RN, CNS, CDE Inpatient Diabetes Program, team pager [phone] or [phone]

## 2012-02-09 ENCOUNTER — Encounter (HOSPITAL_COMMUNITY): Payer: Self-pay | Admitting: Vascular Surgery

## 2012-02-09 LAB — CBC
HCT: 28.2 % — ABNORMAL LOW (ref 36.0–46.0)
MCHC: 34 g/dL (ref 30.0–36.0)
MCV: 70.7 fL — ABNORMAL LOW (ref 78.0–100.0)
Platelets: 310 10*3/uL (ref 150–400)
RDW: 14.1 % (ref 11.5–15.5)

## 2012-02-09 LAB — GLUCOSE, CAPILLARY: Glucose-Capillary: 93 mg/dL (ref 70–99)

## 2012-02-09 LAB — BASIC METABOLIC PANEL
BUN: 31 mg/dL — ABNORMAL HIGH (ref 6–23)
Calcium: 8.9 mg/dL (ref 8.4–10.5)
Creatinine, Ser: 2.48 mg/dL — ABNORMAL HIGH (ref 0.50–1.10)
GFR calc Af Amer: 25 mL/min — ABNORMAL LOW (ref >90)
GFR calc non Af Amer: 22 mL/min — ABNORMAL LOW (ref >90)

## 2012-02-09 MED ORDER — VANCOMYCIN HCL 10 G IV SOLR: 1500.0000 mg | INTRAVENOUS | Status: AC

## 2012-02-09 MED ORDER — LOSARTAN POTASSIUM 25 MG PO TABS: 25.0000 mg | ORAL_TABLET | Freq: Every day | ORAL | Status: AC

## 2012-02-09 MED ORDER — HEPARIN SOD (PORK) LOCK FLUSH 100 UNIT/ML IV SOLN
250.0000 [IU] | INTRAVENOUS | Status: AC | PRN
  Administered 2012-02-09: 250 [IU]

## 2012-02-09 MED ORDER — SODIUM CHLORIDE 0.9 % IV SOLN: 1500.0000 mg | INTRAVENOUS | Status: DC

## 2012-02-09 MED ORDER — FUROSEMIDE 40 MG PO TABS: 40.0000 mg | ORAL_TABLET | Freq: Every day | ORAL | Status: AC

## 2012-02-09 MED ORDER — PIPERACILLIN-TAZOBACTAM 3.375 G IVPB: 3.3750 g | Freq: Three times a day (TID) | INTRAVENOUS | Status: AC

## 2012-02-09 NOTE — Progress Notes (Signed)
DC HOME WITH CENTRAL LINE. IV TEAM SAW BEFORE DC.

## 2012-02-09 NOTE — Progress Notes (Signed)
DC HOME WITH FAMILY, VERBALLY UNDERSTOOD DC INSTRUCTIONS,  NO QUESTIONS ASKED BY PT.[name]

## 2012-03-07 ENCOUNTER — Other Ambulatory Visit: Payer: Self-pay | Admitting: *Deleted

## 2012-03-18 ENCOUNTER — Ambulatory Visit (HOSPITAL_COMMUNITY)
Admission: RE | Admit: 2012-03-18 | Discharge: 2012-03-18 | Disposition: A | Discharge status: Home / Self Care | Payer: Self-pay | Source: Ambulatory Visit | Attending: Vascular Surgery | Admitting: Vascular Surgery

## 2012-03-18 DIAGNOSIS — N186 End stage renal disease: Secondary | ICD-10-CM | POA: Insufficient documentation

## 2012-03-18 DIAGNOSIS — Z452 Encounter for adjustment and management of vascular access device: Secondary | ICD-10-CM | POA: Insufficient documentation

## 2012-03-18 NOTE — Progress Notes (Signed)
[facility] SHORT STAY H&P  CC:  Catheter removal   HPI:  This is a 47 y.o. female here for Hickman catheter removal.      Past Medical History  Diagnosis Date  . Hypertension   . Hypothyroidism   . Diabetic retinopathy(362.0)   . Charcot ankle     AND FOOT - LEFT  . Osteomyelitis of foot 1993    PT REQUIRED BELOW THE KNEE AMPUTATION   . Blood transfusion     "multiple" (02/01/2012)  . GERD (gastroesophageal reflux disease)     PT WOULD TAKE PROTONIX IF HAVING GERD  . Depression   . Anxiety   . Sleep apnea     PT STATES SLEEP STUDY ABOUT 13 YRS AGO-TOLD SHE HAD SLEEP APNEA BUT NEVER GIVEN CPAP  . Hypercholesteremia   . SVT (supraventricular tachycardia) 2002    PAST HX SVT--HAD HEART ABLATION 2002--NO PROBLEMS SINCE  . Exertional dyspnea     "lately" (02/01/2012)  . Hashimoto thyroiditis, fibrous variant 1997  . Type 1 diabetes     PT HAS INSULIN PUMP-DR. ALTHEIMER MANAGES PT'S PUMP  . Thalassemia minor   . Iron deficiency anemia   . Headache     2010--DX WITH CLUSTER H/A'S -JUST OCCAS BAD HEADACHE NOW  . Sinus headache   . Migraine     "q couple years" (02/01/2012)  . DJD (degenerative joint disease) of hip     HX OF DJD LEFT HIP (S/P LEFT TOTAL HIP ARTHROPLASTY )AND ARTHRITIS LEFT ANKLE  . Arthritis     "shoulders; left ankle, foot, knee" (02/01/2012)  . Chronic kidney disease, stage 2 (mild)     FH:  Non-Contributory  History   Social History  . Marital Status: Single    Spouse Name: N/A    Number of Children: N/A  . Years of Education: N/A   Occupational History  . Not on file.   Social History Main Topics  . Smoking status: Never Smoker   . Smokeless tobacco: Never Used  . Alcohol Use: Yes     Comment: 02/01/2012 "couple times/yr might have a glass of wine"  . Drug Use: No  . Sexually Active: Not Currently   Other Topics Concern  . Not on file   Social History Narrative  . No narrative on file    Allergies  Allergen  Reactions  . Fish Allergy Anaphylaxis and Itching    "I can eat tuna, shrimp, salmon; can't eat anything else" (02/01/2012)  . Travatan (Travoprost) Itching  . Other Itching    Grapes "my throat itches" (02/01/2012)    Current Outpatient Prescriptions  Medication Sig Dispense Refill  . atorvastatin (LIPITOR) 40 MG tablet Take 40 mg by mouth at bedtime.       . carvedilol (COREG) 6.25 MG tablet Take 6.25 mg by mouth 2 (two) times daily.       . Cholecalciferol (VITAMIN D-3) 5000 UNITS TABS Take 5,000 Units by mouth daily.      Marland Kitchen escitalopram (LEXAPRO) 20 MG tablet Take 20 mg by mouth daily after breakfast.      . felodipine (PLENDIL) 5 MG 24 hr tablet Take 5 mg by mouth daily after breakfast.       . ferrous sulfate 325 (65 FE) MG tablet Take 1 tablet (325 mg total) by mouth 3 (three) times daily after meals.      . furosemide (LASIX) 40 MG tablet Take 1 tablet (40 mg total) by mouth daily.  30  tablet  3  . Insulin Human (INSULIN PUMP) 100 unit/ml SOLN Inject 1 each into the skin See admin instructions. Patient is taking 2.25 units per hour.      . levothyroxine (SYNTHROID, LEVOTHROID) 125 MCG tablet Take 250 mcg by mouth daily before breakfast.      . loratadine (CLARITIN) 10 MG tablet Take 10 mg by mouth daily.      Marland Kitchen losartan (COZAAR) 25 MG tablet Take 1 tablet (25 mg total) by mouth daily.  30 tablet  3  . mometasone (NASONEX) 50 MCG/ACT nasal spray Place 2 sprays into the nose daily as needed. Allergies       . Multiple Vitamin (MULITIVITAMIN WITH MINERALS) TABS Take 1 tablet by mouth daily.      Marland Kitchen omega-3 acid ethyl esters (LOVAZA) 1 G capsule Take 2 g by mouth daily.      . pantoprazole (PROTONIX) 20 MG tablet Take 20 mg by mouth daily with breakfast. ONCE A DAY AS NEEDED--ONLY IF HAVING REFLUX-DOES NOT TAKE EVERYDAY      . triamcinolone cream (KENALOG) 0.1 % Apply 1 application topically daily.        ROS:  See HPI  PHYSICAL EXAM  Filed Vitals:   03/18/12 1307  BP: 162/84    Pulse: 82  Temp: 98.5 F (36.9 C)  Resp: 18    Gen:  Well developed well nourished HEENT:  normocephalic Neck:  Supple, palpable catheter right neck Heart:  RRR Lungs:  Non-labored Abdomen:  soft Skin:  No obvious rashes Right upper extremity N/V/M intact  Lab/X-ray:  Impression: This is a 47 y.o. female here for diatek catheter removal  Plan:  Removal of right Hickman catheter  Lianne Cure, PA-C [facility] [phone] 03/18/2012 1:42 PM   [facility] Catheter Removal Procedure Note  Diagnosis: ESRD with Functioning AVF/AVGG  Plan:  Remove right diatek catheter  Consent signed:  yes Time out completed:  yes Coumadin:  no PT/INR (if applicable):   Other labs:   Procedure: 1.  Sterile prepping and draping over catheter area 2. 0 ml 2% lidocaine plain instilled at removal site. 3.  right catheter removed in its entirety with cuff in tact. 4.  Complications: none  5. Tip of catheter sent for culture:  no   Patient tolerated procedure well:  yes Pressure held, no bleeding noted, dressing applied Instructions given to the pt regarding wound care and bleeding.  OtherClinton Gallant [facility] 03/18/2012 1:42 PM

## 2012-08-04 ENCOUNTER — Encounter: Payer: Self-pay | Admitting: Cardiology

## 2012-08-04 DIAGNOSIS — E11319 Type 2 diabetes mellitus with unspecified diabetic retinopathy without macular edema: Secondary | ICD-10-CM | POA: Insufficient documentation

## 2012-08-11 ENCOUNTER — Ambulatory Visit
Admission: RE | Admit: 2012-08-11 | Discharge: 2012-08-11 | Disposition: A | Discharge status: Home / Self Care | Payer: BC Managed Care – PPO | Source: Ambulatory Visit | Attending: Cardiology | Admitting: Cardiology

## 2012-08-11 ENCOUNTER — Other Ambulatory Visit: Payer: Self-pay | Admitting: Cardiology

## 2012-08-11 DIAGNOSIS — R0789 Other chest pain: Secondary | ICD-10-CM

## 2012-11-29 ENCOUNTER — Other Ambulatory Visit: Payer: Self-pay | Admitting: Internal Medicine

## 2012-11-29 DIAGNOSIS — N6310 Unspecified lump in the right breast, unspecified quadrant: Secondary | ICD-10-CM

## 2012-11-29 DIAGNOSIS — N644 Mastodynia: Secondary | ICD-10-CM

## 2012-12-08 ENCOUNTER — Inpatient Hospital Stay: Admit: 2012-12-08 | Discharge: 2012-12-08 | Disposition: A | Discharge status: Home / Self Care | Payer: Self-pay

## 2012-12-08 ENCOUNTER — Ambulatory Visit
Admission: RE | Admit: 2012-12-08 | Discharge: 2012-12-08 | Disposition: A | Discharge status: Home / Self Care | Payer: BC Managed Care – PPO | Source: Ambulatory Visit | Attending: Internal Medicine | Admitting: Internal Medicine

## 2012-12-08 DIAGNOSIS — N6310 Unspecified lump in the right breast, unspecified quadrant: Secondary | ICD-10-CM

## 2013-03-21 ENCOUNTER — Other Ambulatory Visit (HOSPITAL_COMMUNITY): Payer: Self-pay | Admitting: *Deleted

## 2013-03-22 ENCOUNTER — Encounter (HOSPITAL_COMMUNITY)
Admission: RE | Admit: 2013-03-22 | Discharge: 2013-03-22 | Disposition: A | Discharge status: Home / Self Care | Payer: BC Managed Care – PPO | Source: Ambulatory Visit | Attending: Nephrology | Admitting: Nephrology

## 2013-03-22 DIAGNOSIS — N183 Chronic kidney disease, stage 3 unspecified: Secondary | ICD-10-CM | POA: Insufficient documentation

## 2013-03-22 DIAGNOSIS — D509 Iron deficiency anemia, unspecified: Secondary | ICD-10-CM | POA: Insufficient documentation

## 2013-03-22 MED ORDER — SODIUM CHLORIDE 0.9 % IV SOLN
1020.0000 mg | Freq: Once | INTRAVENOUS | Status: AC
  Administered 2013-03-22: 1020 mg via INTRAVENOUS
  Filled 2013-03-22: qty 34

## 2013-05-04 IMAGING — CR DG KNEE COMPLETE 4+V*R*
4 series · 4 of 4 positions shown · non-contrast
Comparison: None.

CLINICAL DATA: Swelling after fall.

RIGHT KNEE - COMPLETE 4+ VIEW

[view not recorded (1 of 4)]
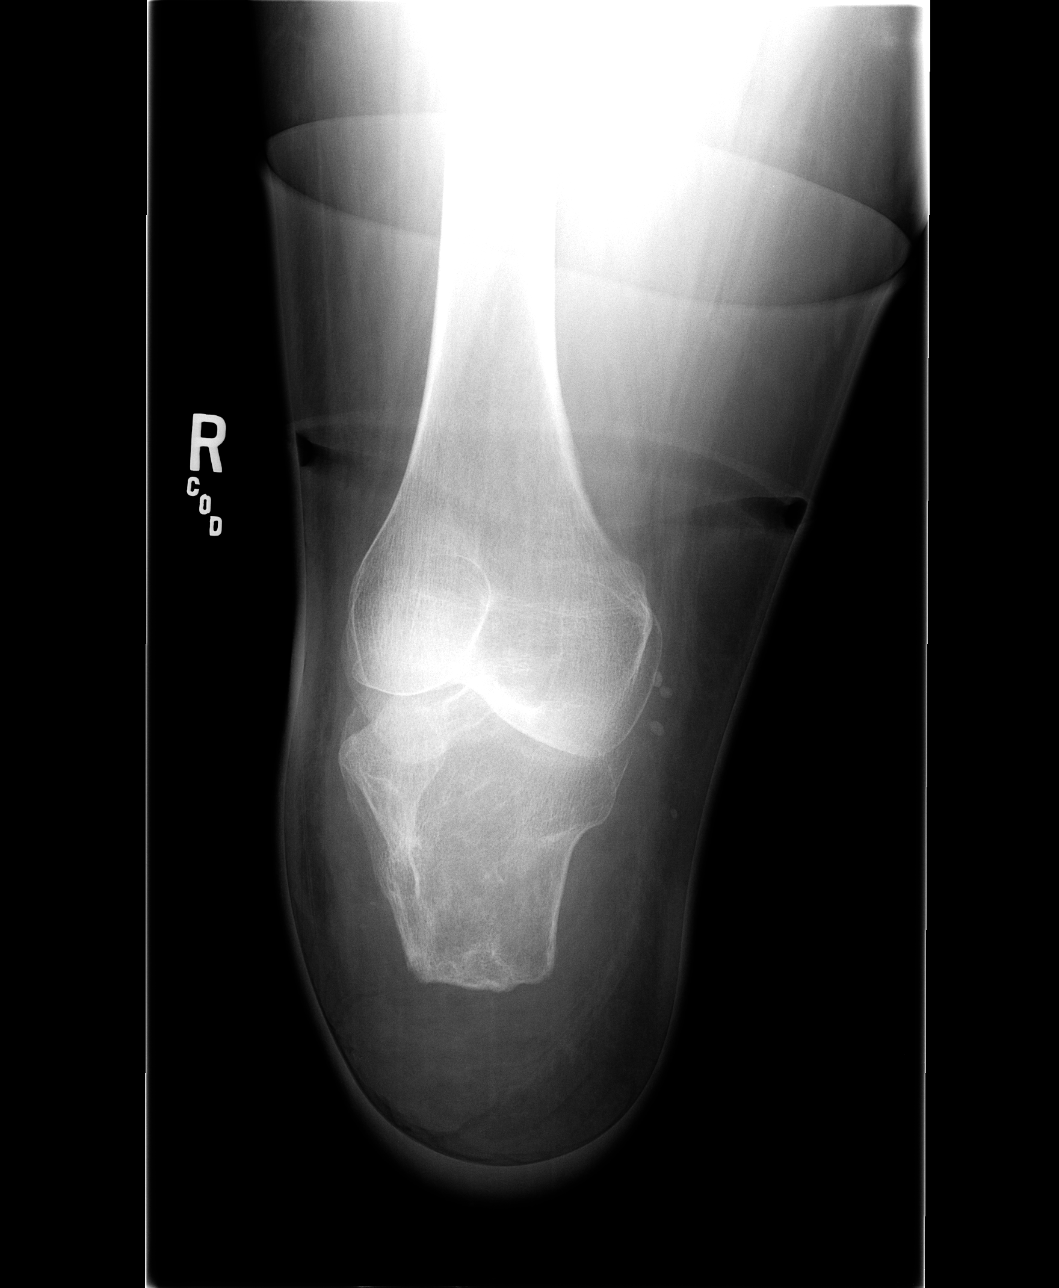

[view not recorded (2 of 4)]
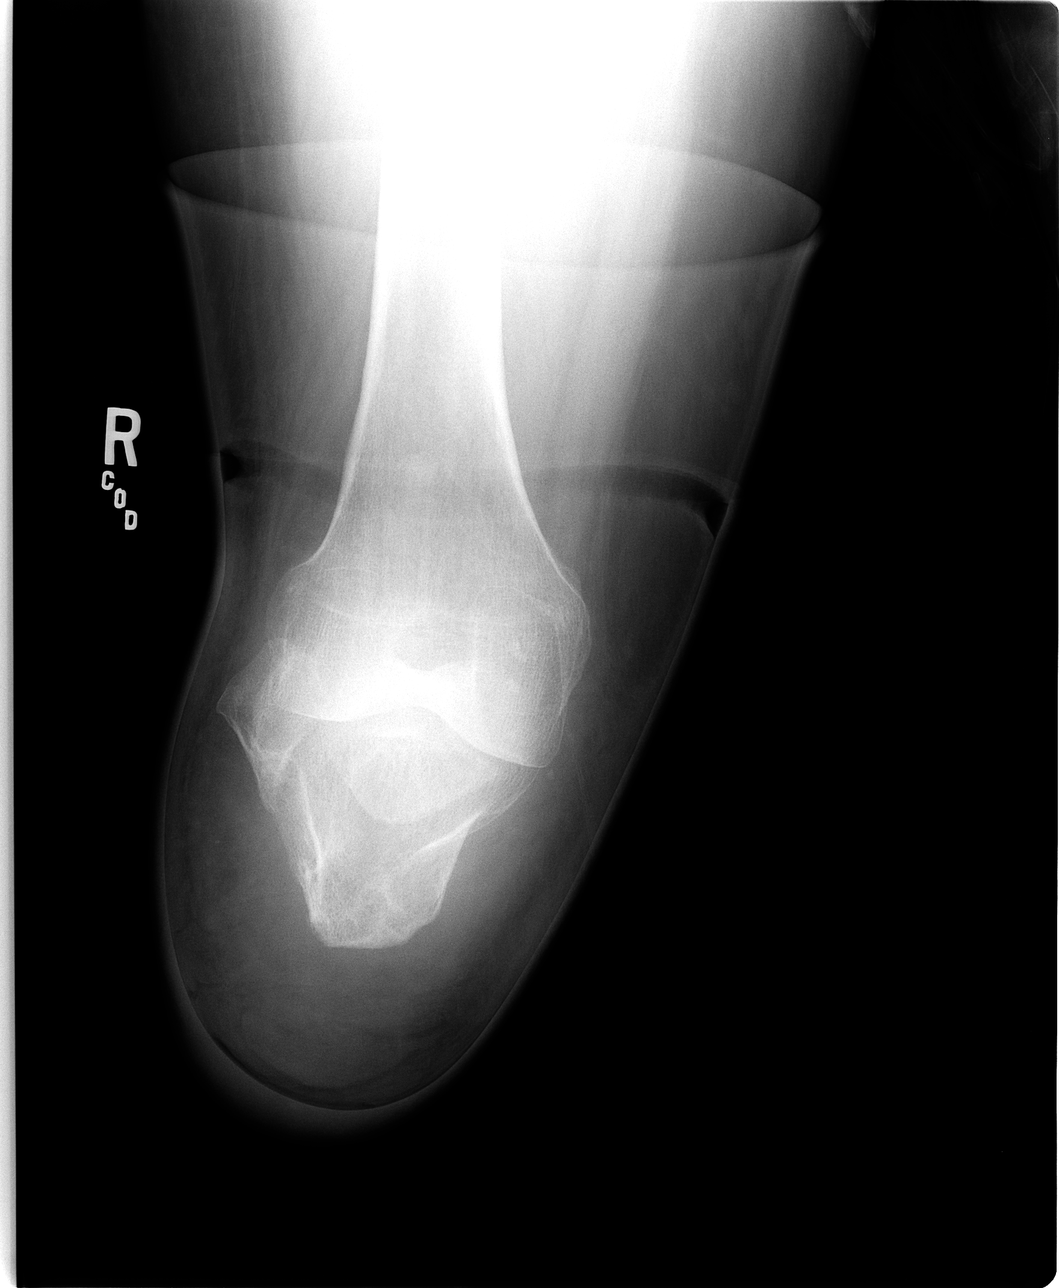

[view not recorded (3 of 4)]
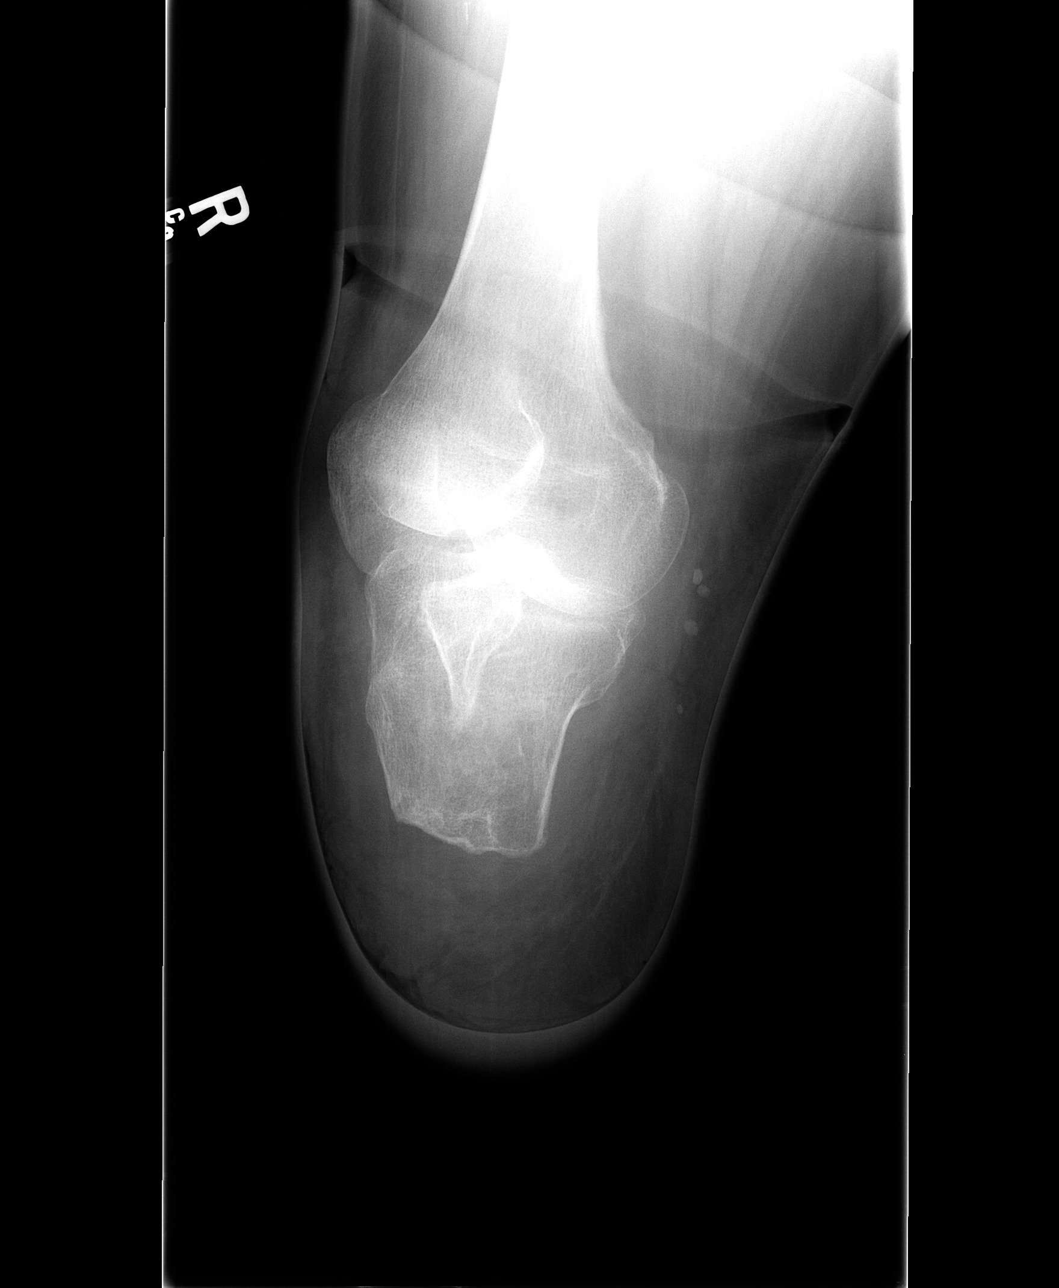

[view not recorded (4 of 4)]
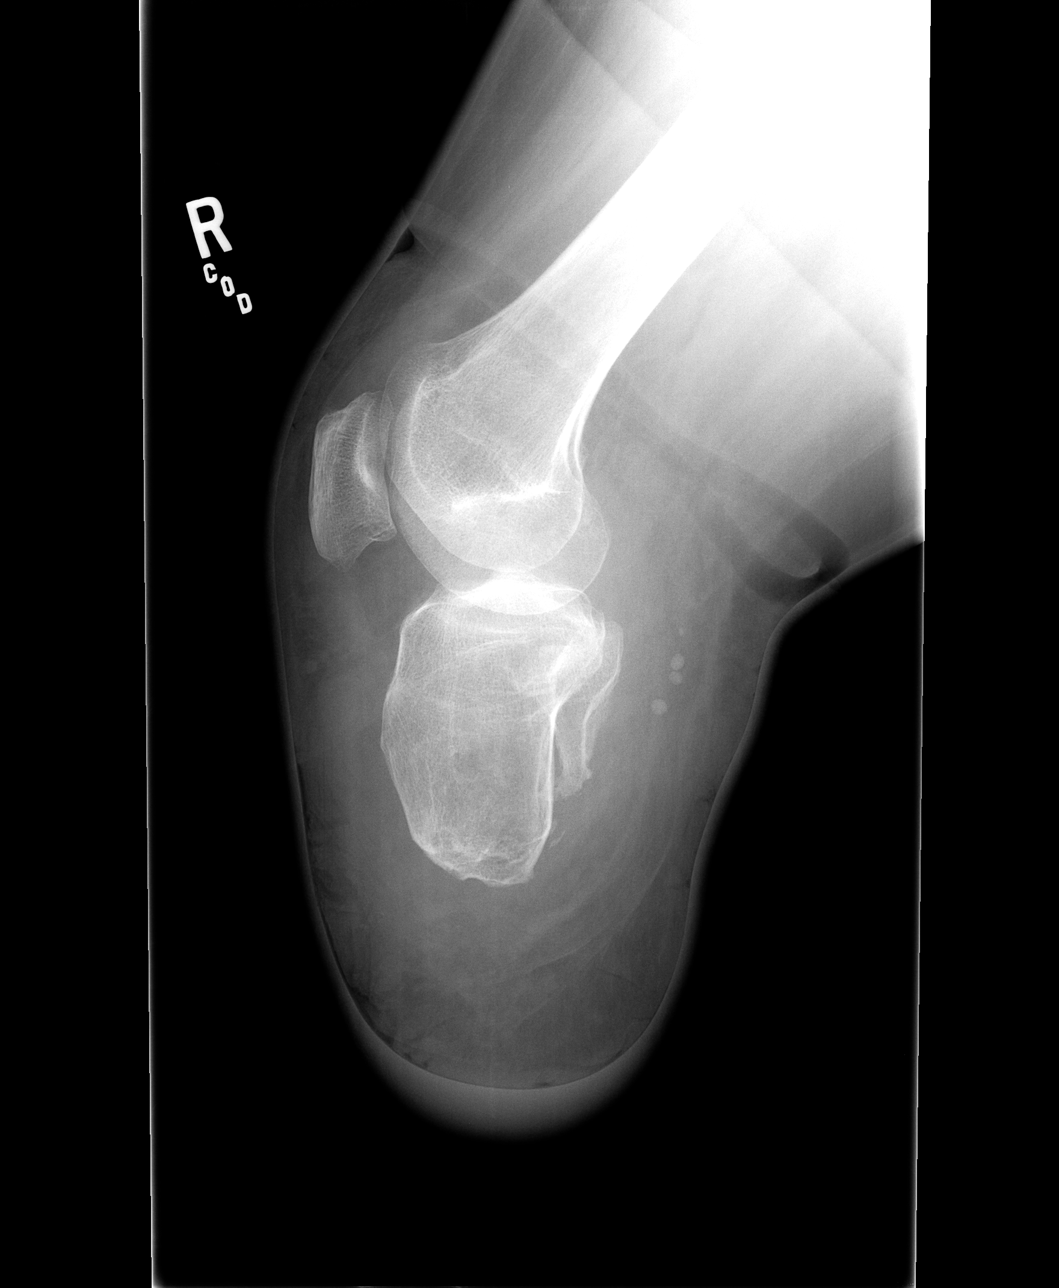

[4 of 4 positions shown; findings below may reference images not displayed]

FINDINGS: Post below-knee amputation.  No fracture or dislocation.
No plain film evidence of osteomyelitis.  Lucency distal femoral
soft tissue region of questionable etiology.

Soft tissue prominence of the stump.  Question normal versus
hemorrhage or inflammation.
IMPRESSION: Post below-knee amputation.

No fracture or dislocation.

No plain film evidence of osteomyelitis.

Lucency distal femoral soft tissue region of questionable etiology.

Soft tissue prominence of the stump.  Question normal versus
hemorrhage or inflammation.

## 2014-02-05 IMAGING — CR DG CHEST 1V PORT
2 series · 2 of 2 positions shown · non-contrast
Comparison: 11/21/2010

CLINICAL DATA: Internal jugular catheter placement

PORTABLE CHEST - 1 VIEW

[AP (1 of 2)]
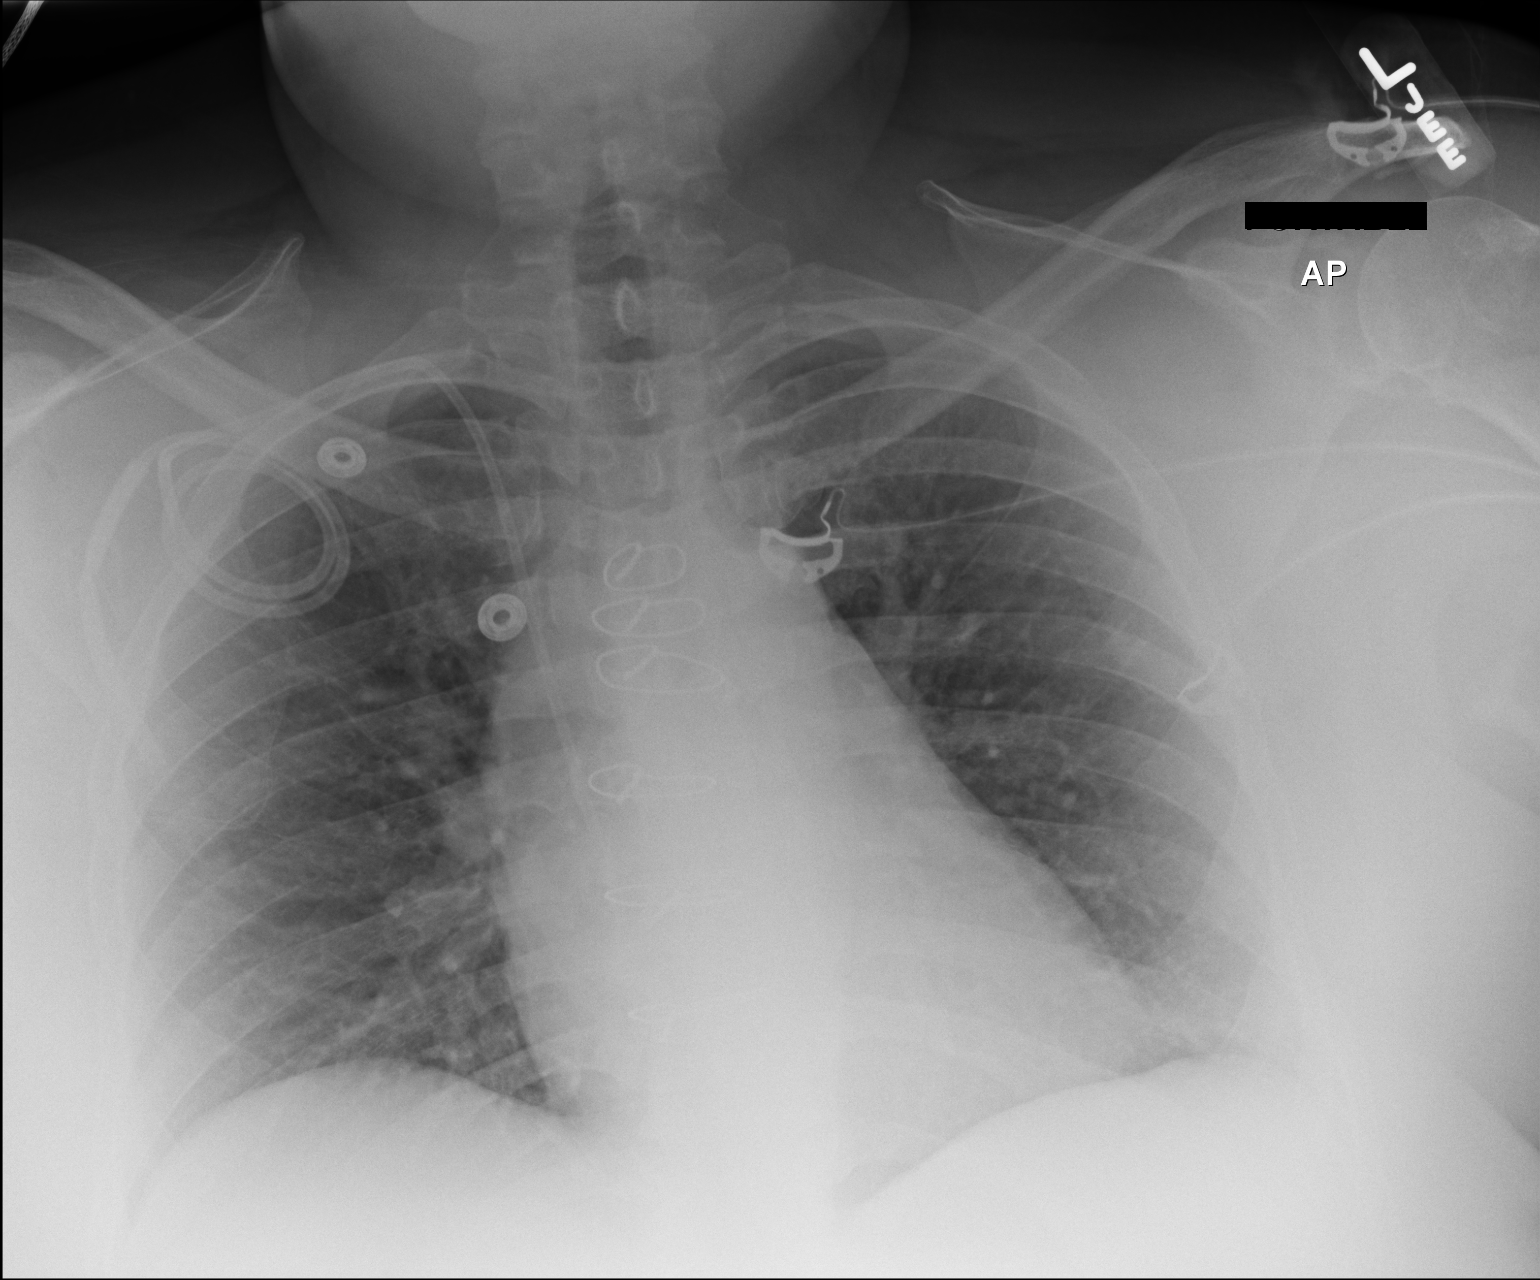

[AP (2 of 2)]
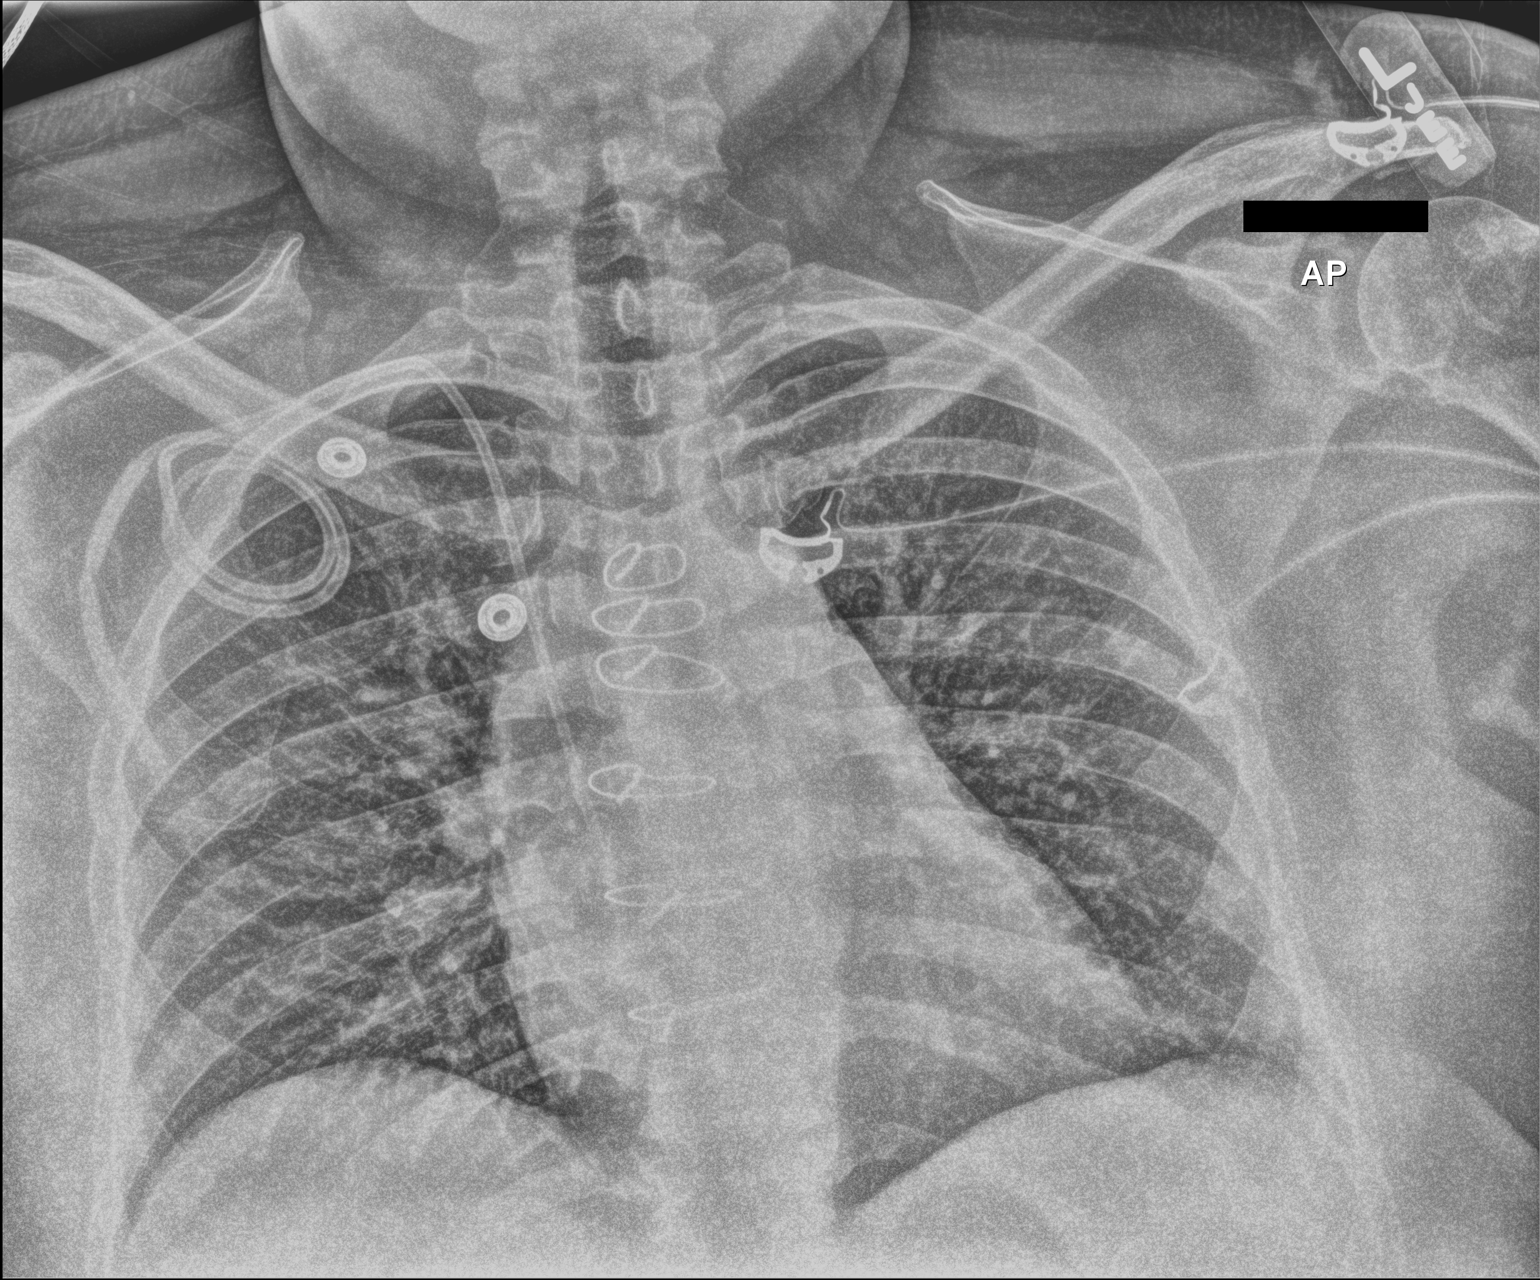

[2 of 2 positions shown; findings below may reference images not displayed]

FINDINGS: There is a new right internal jugular central venous
catheter that has its tip in the lower superior vena cava.  There
is no pneumothorax.

The lungs are clear.  There are changes from a prior median
sternotomy, stable.  The cardiac silhouette is normal in size.  No
mediastinal or hilar masses or adenopathy.
IMPRESSION: New right internal jugular central venous catheter has its tip in
the lower superior vena cava, well positioned.  No pneumothorax.
No acute cardiopulmonary disease.

## 2015-10-29 ENCOUNTER — Other Ambulatory Visit: Payer: Self-pay | Admitting: Gastroenterology

## 2015-10-29 ENCOUNTER — Encounter: Payer: Self-pay | Admitting: Internal Medicine

## 2015-10-29 ENCOUNTER — Observation Stay
Admission: EM | Admit: 2015-10-29 | Discharge: 2015-10-30 | Disposition: A | Discharge status: Home / Self Care | Payer: Self-pay | Source: Ambulatory Visit | Attending: Emergency Medicine | Admitting: Emergency Medicine

## 2015-10-29 DIAGNOSIS — R55 Syncope and collapse: Principal | ICD-10-CM | POA: Insufficient documentation

## 2015-10-29 DIAGNOSIS — N183 Chronic kidney disease, stage 3 (moderate): Secondary | ICD-10-CM | POA: Insufficient documentation

## 2015-10-29 DIAGNOSIS — I129 Hypertensive chronic kidney disease with stage 1 through stage 4 chronic kidney disease, or unspecified chronic kidney disease: Secondary | ICD-10-CM | POA: Insufficient documentation

## 2015-10-29 DIAGNOSIS — E1122 Type 2 diabetes mellitus with diabetic chronic kidney disease: Secondary | ICD-10-CM | POA: Insufficient documentation

## 2015-10-29 DIAGNOSIS — I471 Supraventricular tachycardia: Secondary | ICD-10-CM

## 2015-10-29 DIAGNOSIS — R42 Dizziness and giddiness: Secondary | ICD-10-CM | POA: Insufficient documentation

## 2015-10-29 DIAGNOSIS — E109 Type 1 diabetes mellitus without complications: Secondary | ICD-10-CM | POA: Insufficient documentation

## 2015-10-29 DIAGNOSIS — E079 Disorder of thyroid, unspecified: Secondary | ICD-10-CM | POA: Insufficient documentation

## 2015-10-29 DIAGNOSIS — Z794 Long term (current) use of insulin: Secondary | ICD-10-CM | POA: Insufficient documentation

## 2015-10-29 DIAGNOSIS — E785 Hyperlipidemia, unspecified: Secondary | ICD-10-CM | POA: Insufficient documentation

## 2015-10-29 DIAGNOSIS — I10 Essential (primary) hypertension: Secondary | ICD-10-CM | POA: Insufficient documentation

## 2015-10-29 DIAGNOSIS — E039 Hypothyroidism, unspecified: Secondary | ICD-10-CM | POA: Insufficient documentation

## 2015-10-29 DIAGNOSIS — F3289 Other specified depressive episodes: Secondary | ICD-10-CM | POA: Insufficient documentation

## 2015-10-29 HISTORY — DX: Osteomyelitis, unspecified: M86.9

## 2015-10-29 LAB — CBC AND DIFFERENTIAL
Baso # K/uL: 0 10*3/uL (ref 0.0–0.1)
Basophil %: 0 %
Eos # K/uL: 0 10*3/uL (ref 0.0–0.4)
Eosinophil %: 0 %
Hematocrit: 33 % — ABNORMAL LOW (ref 34–45)
Hemoglobin: 10.1 g/dL — ABNORMAL LOW (ref 11.2–15.7)
IMM Granulocytes #: 0 10*3/uL (ref 0.0–0.1)
IMM Granulocytes: 0.3 %
Lymph # K/uL: 1.5 10*3/uL (ref 1.2–3.7)
Lymphocyte %: 20 %
MCH: 22 pg — ABNORMAL LOW (ref 26–32)
MCHC: 31 g/dL — ABNORMAL LOW (ref 32–36)
MCV: 73 fL — ABNORMAL LOW (ref 79–95)
Mono # K/uL: 0.8 10*3/uL (ref 0.2–0.9)
Monocyte %: 11.5 %
Neut # K/uL: 4.9 10*3/uL (ref 1.6–6.1)
Nucl RBC # K/uL: 0 10*3/uL (ref 0.0–0.0)
Nucl RBC %: 0 /100 WBC (ref 0.0–0.2)
Platelets: 274 10*3/uL (ref 160–370)
RBC: 4.5 MIL/uL (ref 3.9–5.2)
RDW: 14.6 % — ABNORMAL HIGH (ref 11.7–14.4)
Seg Neut %: 68.2 %
WBC: 7.2 10*3/uL (ref 4.0–10.0)

## 2015-10-29 LAB — BASIC METABOLIC PANEL
Anion Gap: 16 (ref 7–16)
Anion Gap: 15 (ref 7–16)
CO2: 20 mmol/L (ref 20–28)
CO2: 21 mmol/L (ref 20–28)
Calcium: 8.8 mg/dL (ref 8.6–10.2)
Calcium: 8.7 mg/dL (ref 8.6–10.2)
Chloride: 102 mmol/L (ref 96–108)
Chloride: 100 mmol/L (ref 96–108)
Creatinine: 3.08 mg/dL — ABNORMAL HIGH (ref 0.51–0.95)
Creatinine: 2.68 mg/dL — ABNORMAL HIGH (ref 0.51–0.95)
GFR,Black: 19 * — AB
GFR,Black: 23 * — AB
GFR,Caucasian: 17 * — AB
GFR,Caucasian: 20 * — AB
Glucose: 122 mg/dL — ABNORMAL HIGH (ref 60–99)
Glucose: 127 mg/dL — ABNORMAL HIGH (ref 60–99)
Lab: 44 mg/dL — ABNORMAL HIGH (ref 6–20)
Lab: 43 mg/dL — ABNORMAL HIGH (ref 6–20)
Potassium: 4.8 mmol/L (ref 3.3–5.1)
Potassium: 4.5 mmol/L (ref 3.3–5.1)
Sodium: 138 mmol/L (ref 133–145)
Sodium: 136 mmol/L (ref 133–145)

## 2015-10-29 LAB — POCT URINALYSIS DIPSTICK
Blood,UA POCT: NEGATIVE
Glucose,UA POCT: NORMAL
Ketones,UA POCT: NEGATIVE
Leuk Esterase,UA POCT: 1 — AB
Lot #: 22255401
Nitrite,UA POCT: POSITIVE — AB
PH,UA POCT: 5 (ref 5–8)

## 2015-10-29 LAB — URINE MICROSCOPIC (IQ200): RBC,UA: NONE SEEN /hpf (ref 0–2)

## 2015-10-29 LAB — URINALYSIS REFLEX TO CULTURE
Blood,UA: NEGATIVE
Ketones, UA: NEGATIVE
Nitrite,UA: POSITIVE — AB
Protein,UA: 25 mg/dL — AB
Specific Gravity,UA: 1.01 (ref 1.001–1.030)
pH,UA: 5 (ref 5.0–8.0)

## 2015-10-29 LAB — TROPONIN T
Troponin T: 0.01 ng/mL (ref 0.00–0.02)
Troponin T: 0.01 ng/mL (ref 0.00–0.02)

## 2015-10-29 LAB — SODIUM, URINE: Sodium,UR: 82 mEq/L

## 2015-10-29 LAB — POCT GLUCOSE
Glucose POCT: 123 mg/dL — ABNORMAL HIGH (ref 60–99)
Glucose POCT: 149 mg/dL — ABNORMAL HIGH (ref 60–99)

## 2015-10-29 LAB — CREATININE, URINE: Creatinine,UR: 87 mg/dL (ref 20–300)

## 2015-10-29 MED ORDER — ASPIRIN 81 MG PO CHEW *I*
81.0000 mg | CHEWABLE_TABLET | Freq: Every day | ORAL | Status: DC
Start: 2015-10-30 — End: 2015-10-30
  Administered 2015-10-30: 81 mg via ORAL
  Filled 2015-10-29: qty 1

## 2015-10-29 MED ORDER — FLUTICASONE PROPIONATE 50 MCG/ACT NA SUSP *I*
1.0000 | Freq: Every day | NASAL | Status: DC
Start: 2015-10-30 — End: 2015-10-30
  Filled 2015-10-29: qty 16

## 2015-10-29 MED ORDER — SODIUM CHLORIDE 0.9 % IV BOLUS *I*
1000.0000 mL | Freq: Once | Status: AC
Start: 2015-10-29 — End: 2015-10-29
  Administered 2015-10-29: 1000 mL via INTRAVENOUS

## 2015-10-29 MED ORDER — CETIRIZINE HCL 10 MG PO TABS *I*
5.0000 mg | ORAL_TABLET | Freq: Every day | ORAL | Status: DC
Start: 2015-10-30 — End: 2015-10-30
  Administered 2015-10-30: 5 mg via ORAL
  Filled 2015-10-29: qty 1

## 2015-10-29 MED ORDER — INSULIN SQ (HUMALOG 100 UNITS/ML PUMP INFUSION (BASAL RATE) *I*
SUBCUTANEOUS | Status: DC
Start: 2015-10-29 — End: 2015-10-30
  Filled 2015-10-29: qty 10

## 2015-10-29 MED ORDER — INSULIN SQ BOLUS (HUMALOG) 100 UNITS/ML FROM PUMP *I*
SUBCUTANEOUS | Status: DC | PRN
Start: 2015-10-29 — End: 2015-10-30
  Filled 2015-10-29: qty 10

## 2015-10-29 MED ORDER — ESCITALOPRAM OXALATE 10 MG PO TABS *I*
20.0000 mg | ORAL_TABLET | Freq: Every day | ORAL | Status: DC
Start: 2015-10-30 — End: 2015-10-30
  Administered 2015-10-30: 20 mg via ORAL
  Filled 2015-10-29: qty 2

## 2015-10-29 MED ORDER — ACETAMINOPHEN 325 MG PO TABS *I*
650.0000 mg | ORAL_TABLET | Freq: Four times a day (QID) | ORAL | Status: DC | PRN
Start: 2015-10-29 — End: 2015-10-30
  Administered 2015-10-29 – 2015-10-30 (×2): 650 mg via ORAL
  Filled 2015-10-29 (×2): qty 2

## 2015-10-29 MED ORDER — INSULIN SQ (HUMALOG 100 UNITS/ML) PUMP REFILL *I*
SUBCUTANEOUS | Status: DC | PRN
Start: 2015-10-29 — End: 2015-10-30
  Filled 2015-10-29: qty 3

## 2015-10-29 MED ORDER — LEVOTHYROXINE SODIUM 125 MCG PO TABS *I*
250.0000 ug | ORAL_TABLET | Freq: Every day | ORAL | Status: DC
Start: 2015-10-30 — End: 2015-10-30
  Administered 2015-10-30: 250 ug via ORAL
  Filled 2015-10-29: qty 2

## 2015-10-29 MED ORDER — FUROSEMIDE 40 MG PO TABS *I*
80.0000 mg | ORAL_TABLET | Freq: Every morning | ORAL | Status: DC
Start: 2015-10-30 — End: 2015-10-30
  Administered 2015-10-30: 80 mg via ORAL
  Filled 2015-10-29: qty 2

## 2015-10-29 MED ORDER — FUROSEMIDE 40 MG PO TABS *I*
40.0000 mg | ORAL_TABLET | Freq: Every evening | ORAL | Status: DC
Start: 2015-10-29 — End: 2015-10-30
  Administered 2015-10-29: 40 mg via ORAL
  Filled 2015-10-29: qty 1

## 2015-10-29 MED ORDER — LISINOPRIL 5 MG PO TABS *I*
5.0000 mg | ORAL_TABLET | Freq: Every day | ORAL | Status: DC
Start: 2015-10-30 — End: 2015-10-30
  Administered 2015-10-30: 5 mg via ORAL
  Filled 2015-10-29: qty 1

## 2015-10-29 MED ORDER — GLUCOSE 40 % PO GEL *I*
15.0000 g | ORAL | Status: DC | PRN
Start: 2015-10-29 — End: 2015-10-30

## 2015-10-29 MED ORDER — FELODIPINE 2.5 MG PO TB24 *I*
5.0000 mg | ORAL_TABLET | Freq: Every morning | ORAL | Status: DC
Start: 2015-10-30 — End: 2015-10-30
  Administered 2015-10-30: 5 mg via ORAL
  Filled 2015-10-29: qty 2

## 2015-10-29 MED ORDER — GLUCAGON HCL (RDNA) 1 MG IJ SOLR *WRAPPED*
1.0000 mg | INTRAMUSCULAR | Status: DC | PRN
Start: 2015-10-29 — End: 2015-10-30

## 2015-10-29 MED ORDER — DEXTROSE 50 % IV SOLN *I*
25.0000 g | INTRAVENOUS | Status: DC | PRN
Start: 2015-10-29 — End: 2015-10-30

## 2015-10-29 MED ORDER — MONTELUKAST SODIUM 10 MG PO TABS *I*
10.0000 mg | ORAL_TABLET | Freq: Every evening | ORAL | Status: DC
Start: 2015-10-29 — End: 2015-10-30
  Administered 2015-10-29: 10 mg via ORAL
  Filled 2015-10-29: qty 1

## 2015-10-29 MED ORDER — ROSUVASTATIN CALCIUM 20 MG PO TABS *I*
40.0000 mg | ORAL_TABLET | Freq: Every day | ORAL | Status: DC
Start: 2015-10-29 — End: 2015-10-30
  Administered 2015-10-29: 40 mg via ORAL
  Filled 2015-10-29: qty 2

## 2015-10-29 MED ORDER — CARVEDILOL 3.125 MG PO TABS *I*
6.2500 mg | ORAL_TABLET | Freq: Two times a day (BID) | ORAL | Status: DC
Start: 2015-10-29 — End: 2015-10-30
  Administered 2015-10-29 – 2015-10-30 (×2): 6.25 mg via ORAL
  Filled 2015-10-29 (×2): qty 2

## 2015-10-29 MED ORDER — INSULIN SQ BOLUS (HUMALOG) 100 UNITS/ML FROM PUMP *I*
Freq: Three times a day (TID) | SUBCUTANEOUS | Status: DC
Start: 2015-10-29 — End: 2015-10-30
  Administered 2015-10-29: 17.7 [IU] via SUBCUTANEOUS
  Filled 2015-10-29: qty 10

## 2015-10-29 NOTE — ED Notes (Signed)
Pt reports feeling dizzy at work today while sitting. Pt became diaphoretic and had ear fullness during episode but denies chest pain, SOB, H/A. Pt is diabetic and wears an inculin pump, EMTs report pt's BG was 167 when they arrived. Pt reports driving 10 hours 2 days ago from [place]. Pt denies any blood clots or cardiac hx. Pt denies recent illness and has been drinking and eating normally. IV placed, labs drawn, monitor placed, call bell in reach.

## 2015-10-29 NOTE — ED Obs Notes (Signed)
ED OBSERVATION ADMISSION NOTE    Patient seen by me today, 10/29/2015 at 1550    Current patient status: Observation    History     Chief Complaint   Patient presents with    Dizziness     Pt to ED with c/o dizziness and lightheadedness.  Started when standing from her desk chair.  Denies other symptoms.     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 She presents into Observation following pre-syncope, dizziness episode. Pt just moved up to [place] from [place], [place] on Monday. She was at work today. She did not eat breakfast, but brought it to work with her. She had just finished eating when she developed dizziness, near-syncope. Her co-workers checked her blood pressure and it was 90/60. Pts baseline BP per her is 140/90. She was diaphoretic. No chest pain. No shortness of breath. She did have nausea. She comes into the ED and is found to have Creatinine of 3.08. Per her nephrologist in [place], her baseline creatinine is 2.2/2.5. She has not established care with a PCP/nephrologist/endochronilogist. She has an insulin pump; her BG before event was 169, of which she bolused 5.9 units. Pt is chest pain free today. She is brought into observation for tele monitoring, repeat creatinine, symptom management.       History provided by:  Patient  Language interpreter used: No    Is this ED visit related to civilian activity for income:  Not work related      Past Medical History:   Diagnosis Date    Osteomyelitis        Past Surgical History:   Procedure Laterality Date    cataracts Bilateral     left foot amputation Left     broke foot, never healed    LEG AMPUTATION BELOW KNEE Right     THYROIDECTOMY         History reviewed. No pertinent family  history.    Social History      reports that she has never smoked. She has never used smokeless tobacco. She reports that she drinks alcohol. She reports that she does not use illicit drugs. Her sexual activity history is not on file.    Living Situation     Questions Responses    Patient lives with     Comment: staying with her aunt     Homeless     Caregiver for other family member     External Services     Employment Employed    Comment: Social research officer, government at [employer]           Review of Systems   Review of Systems   Constitutional: Negative.  Negative for chills and fever.   HENT: Negative.  Negative for congestion and rhinorrhea.    Eyes: Negative.  Negative for redness.   Respiratory: Negative.  Negative for shortness of breath.    Cardiovascular: Negative for chest pain.   Gastrointestinal: Negative.  Negative for nausea and vomiting.   Endocrine: Negative.  Negative for polyuria.   Genitourinary: Negative.  Negative for dysuria.   Musculoskeletal: Negative.  Negative for back pain and gait problem.   Skin: Negative.  Negative for wound.  Allergic/Immunologic: Negative.  Negative for immunocompromised state.   Neurological: Positive for dizziness and light-headedness. Negative for seizures.   Hematological: Negative.    Psychiatric/Behavioral: Negative.  Negative for hallucinations.       Physical Exam   BP 179/78 (BP Location: Left arm)   Pulse 80   Temp 36.4 C (97.5 F) (Temporal)    Resp 14   Ht 1.93 m (6\' 4" )   Wt (!) 149.7 kg (330 lb)   SpO2 98%   BMI 40.17 kg/m2    Physical Exam    Tests    ZOX:WRUEAV EKG, normal sinus rhythm    Labs:   All labs in the last 24 hours:   Recent Results (from the past 24 hour(s))   CBC and differential    Collection Time: 10/29/15 11:42 AM   Result Value Ref Range    WBC 7.2 4.0 - 10.0 THOU/uL    RBC 4.5 3.9 - 5.2 MIL/uL    Hemoglobin 10.1 (L) 11.2 - 15.7 g/dL    Hematocrit 33 (L) 34 - 45 %    MCV 73 (L) 79 - 95 fL    MCH 22 (L) 26 - 32 pg    MCHC  31 (L) 32 - 36 g/dL    RDW 40.9 (H) 81.1 - 14.4 %    Platelets 274 160 - 370 THOU/uL    Seg Neut % 68.2 %    Lymphocyte % 20.0 %    Monocyte % 11.5 %    Eosinophil % 0.0 %    Basophil % 0.0 %    Neut # K/uL 4.9 1.6 - 6.1 THOU/uL    Lymph # K/uL 1.5 1.2 - 3.7 THOU/uL    Mono # K/uL 0.8 0.2 - 0.9 THOU/uL    Eos # K/uL 0.0 0.0 - 0.4 THOU/uL    Baso # K/uL 0.0 0.0 - 0.1 THOU/uL    Nucl RBC % 0.0 0.0 - 0.2 /100 WBC    Nucl RBC # K/uL 0.0 0.0 - 0.0 THOU/uL    IMM Granulocytes # 0.0 0.0 - 0.1 THOU/uL    IMM Granulocytes 0.3 %   Basic metabolic panel    Collection Time: 10/29/15 11:42 AM   Result Value Ref Range    Glucose 122 (H) 60 - 99 mg/dL    Sodium 914 782 - 956 mmol/L    Potassium 4.8 3.3 - 5.1 mmol/L    Chloride 102 96 - 108 mmol/L    CO2 20 20 - 28 mmol/L    Anion Gap 16 7 - 16    UN 44 (H) 6 - 20 mg/dL    Creatinine 2.13 (H) 0.51 - 0.95 mg/dL    GFR,Caucasian 17 (!) *    GFR,Black 19 (!) *    Calcium 8.8 8.6 - 10.2 mg/dL        Imaging:;   No orders to display         Medical Decision Making      Assessment: Saina Waage is a 50 y.o. female with a PMH significant for HTN, HLD, DM who presents with dizziness, near- syncope. First troponin negative. EKG with no acute changes. Neuro exam benign. Will place in obs for further evaluation.     Differential diagnosis includes: Vasovagal syncope, orthostatic hypotension, CVA, TIA    1) Near Syncope, Dizziness:  - Continuous tele monitoring  - Serial troponins  - Orthostatic vital signs  - Neuro checks q 4 hours    2.) HTN  -  Lisinopril  - Coreg    3.) DM  - Diabetic diet  - POCT Glucose  - Wears insulin pump, going to alert staff to all bolus' provided    4.) HLD  - Crestor    5.) Thyroid disease  - Levothyroxine     6.) Depression  - Lexapro    7.) CKD  - Repeat BMP at 1900, then in am  Plan; if Creatinine worse, will do fluids overnight, re-assess in am    Follow up; Referral to endo, nephrology, and [facility] clinic     F: Saline lock  E: No acute abnormalities.   N:  Diabetic diet    Social/disposition: Expect d/c tomorrow    Code status: Full code       Medically preferred DVT prophylaxis: None. Right BKA. Pt ambulatory   Smoking Cessation: NA      Leron CroakMegan E Athalie Newhard, NP    Supervising physician Rosette Revealobert Bennett, MD was immediately available       Leron CroakDierks, Fabien Travelstead E, NP  10/29/15 [phone]

## 2015-10-29 NOTE — ED Notes (Signed)
10/29/15 1524   Observation Care   Observation care initiated  Yes   Patient has been verbally notified of their observation status Yes

## 2015-10-30 LAB — HOLD SST

## 2015-10-30 LAB — POCT GLUCOSE
Glucose POCT: 101 mg/dL — ABNORMAL HIGH (ref 60–99)
Glucose POCT: 117 mg/dL — ABNORMAL HIGH (ref 60–99)

## 2015-10-30 LAB — HOLD GREEN WITH GEL

## 2015-10-30 LAB — BASIC METABOLIC PANEL
Anion Gap: 14 (ref 7–16)
CO2: 24 mmol/L (ref 20–28)
Calcium: 8.6 mg/dL (ref 8.6–10.2)
Chloride: 101 mmol/L (ref 96–108)
Creatinine: 2.8 mg/dL — ABNORMAL HIGH (ref 0.51–0.95)
GFR,Black: 21 * — AB
GFR,Caucasian: 19 * — AB
Glucose: 106 mg/dL — ABNORMAL HIGH (ref 60–99)
Lab: 43 mg/dL — ABNORMAL HIGH (ref 6–20)
Potassium: 4.2 mmol/L (ref 3.3–5.1)
Sodium: 139 mmol/L (ref 133–145)

## 2015-10-30 LAB — HOLD LAVENDER

## 2015-10-30 LAB — HOLD BLUE

## 2015-10-30 NOTE — Progress Notes (Signed)
10/30/15 0800   [facility] Patient Class Review   Patient Class Review Observation

## 2015-10-30 NOTE — Discharge Instructions (Signed)
You have a follow up appointment at [facility] [phone]) at [address], on November 05, 2015 @ 12:40 pm. Please bring your insurance card and ID.

## 2015-10-30 NOTE — ED Obs Notes (Signed)
ED OBSERVATION FOLLOW-UP NOTE    Patient:  Anna Ford        [facility] ED Observation Unit (EOU) Progress Note        LOS: 0 days          Subjective     Feeling better today; not dizzy or lightheaded.  Ambulating to bathroom without problem.      Objective     Vitals:    Vitals:    10/30/15 0556   BP: 135/63   Pulse: 77   Resp:    Temp: 36.8 C (98.2 F)   Weight:    Height:        O2 Device: None (Room air) (10/30/15 0556)       BP: (99-179)/(48-78)   Temp:  [36 C (96.8 F)-37.3 C (99.1 F)]   Temp src: Temporal (08/30 0556)  Heart Rate:  [76-83]   Resp:  [12-18]   SpO2:  [97 %-100 %]   Height:  [193 cm (6\' 4" )]   Weight:  [149.7 kg (330 lb)]                  GEN: pleasant, no distress  CVS: RRR, normal s1/s2, no murmurs, rubs or gallop  PULM: clear to auscultation bilaterally with good inspiratory effort  ABD: bowel sounds present, soft, non tender, non distended   NEURO: Awake, alert, and cooperative.       LABS:  BMP/Electrolytes CBC   Recent Labs      10/30/15   0431  10/29/15   1625  10/29/15   1142   Sodium  139  136  138   Potassium  4.2  4.5  4.8   Chloride  101  100  102   CO2  24  21  20    UN  43*  43*  44*   Creatinine  2.80*  2.68*  3.08*   Glucose  106*  127*  122*   Calcium  8.6  8.7  8.8    Recent Labs      10/29/15   1142   WBC  7.2   Hemoglobin  10.1*   Hematocrit  33*   Platelets  274   MCV  73*   RDW  14.6*   Seg Neut %  68.2   Lymphocyte %  20.0   Monocyte %  11.5   Eosinophil %  0.0   Basophil %  0.0      Liver Function Cardiac Studies   No results for input(s): AST, ALT, HTBIL, TB, DB in the last 72 hours.    No components found with this basename: [facility]   Recent Labs  Lab 10/29/15  2202 10/29/15  1625   Troponin T 0.01 0.01       No components found with this basename: [facility]        Telemetry: NSR        Assessment     Assessment and Plan: This is a case of near syncope in a 50 y.o. female who has IDDM and CKD.    Nothing to suggest cardiac cause of her symptoms.  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    At this point, she is stable for discharge home.    She has appointment with new PCP office [facility]) on Sept 5 (less than a week).    She should continue her home medications.        Kirkland Hun-Nazim Kadlec S Elektra Wartman, MD on 10/30/2015  at 9:19 AM      Author: Kirkland Hun, MD  Note created: 10/30/2015  at: 9:19 AM     Kirkland Hun, MD  10/30/15 [phone]

## 2015-10-31 LAB — AEROBIC CULTURE

## 2015-10-31 LAB — EKG 12-LEAD: Rate: 76 {beats}/min

## 2015-10-31 NOTE — ED Provider Notes (Signed)
History     Chief Complaint   Patient presents with    Dizziness     Pt to ED with c/o dizziness and lightheadedness.  Started when standing from her desk chair.  Denies other symptoms.     HPI Comments: 50 yF here with dizziness, pre-syncope. H/o DM, CKD, HTN. Just moved from [facility], no PCP yet. Went to work this AM, while sitting at desk suddenly felt LH, diaphoretic, nauseous. BG 160s at the time. No CP, SOB, palpitations. No med changes.       History provided by:  Patient and medical records    Past Medical History:   Diagnosis Date    Osteomyelitis         Past Surgical History:   Procedure Laterality Date    cataracts Bilateral     left foot amputation Left     broke foot, never healed    LEG AMPUTATION BELOW KNEE Right     THYROIDECTOMY       History reviewed. No pertinent family history.    Social History    reports that she has never smoked. She has never used smokeless tobacco. She reports that she drinks alcohol. She reports that she does not use illicit drugs. Her sexual activity history is not on file.    Living Situation     Questions Responses    Patient lives with     Comment: staying with her aunt     Homeless     Caregiver for other family member     External Services     Employment Employed    Comment: Social research officer, government at [employer]           Problem List     Patient Active Problem List   Diagnosis Code    HTN (hypertension) I10    Hypothyroidism E03.9    Type I diabetes mellitus E10.9    Chronic kidney disease (CKD), stage III (moderate) N18.3    SVT (supraventricular tachycardia) I47.1       Review of Systems   Review of Systems   Constitutional: Negative for fever.   HENT: Negative for ear pain.    Eyes: Negative for pain.   Respiratory: Negative for shortness of breath.    Cardiovascular: Negative for chest pain.   Gastrointestinal: Negative for abdominal pain.   Genitourinary: Negative for flank pain.   Musculoskeletal: Negative for back pain and  neck pain.   Skin: Negative for rash.   Neurological: Positive for dizziness. Negative for headaches.       Physical Exam     ED Triage Vitals   BP Heart Rate Heart Rate (via Pulse Ox) Resp Temp Temp src SpO2 O2 Device O2 Flow Rate   10/29/15 1113 10/29/15 1113 -- 10/29/15 1113 10/29/15 1113 10/29/15 1113 10/29/15 1113 10/29/15 1113 --   99/48 78  12 36 C (96.8 F) TEMPORAL 100 % None (Room air)       Weight           10/29/15 1113           149.7 kg (330 lb)                    Physical Exam   Constitutional: She is oriented to person, place, and time. She appears well-developed. No distress.   Non-toxic   HENT:   Head: Normocephalic and atraumatic.   Eyes: EOM are normal.  Neck: Normal range of motion. Neck supple.   Cardiovascular: Normal rate and regular rhythm.    Pulmonary/Chest: Effort normal. No respiratory distress.   Abdominal: Soft. She exhibits no distension. There is no tenderness.   Musculoskeletal: Normal range of motion. She exhibits no edema.   Neurological: She is alert and oriented to person, place, and time.   Skin: Skin is warm and dry. No rash noted.   Psychiatric: She has a normal mood and affect.   Nursing note and vitals reviewed.      Medical Decision Making      Amount and/or Complexity of Data Reviewed  Clinical lab tests: ordered and reviewed        Initial Evaluation:  ED First Provider Contact     Date/Time Event User Comments    10/29/15 1205 ED Provider First Contact Lorelle Macaluso Initial Face to Face Provider Contact          Patient seen by me as above    Assessment:  50 y.o.female comes to the ED with pre-syncope/dizziness, episode, resolving. Stable    Differential Diagnosis includes metabolic abnormality, dysrhythmia, dehydration, unlikely ACS or CVA                      Plan:   Cbc, bmp, ecg, tele, reassess    ED course  Labs notable for Cr 3.08, called previous nephrologist, last Cr 2.2, prior to that 2.5. Not sure if significant or not, will err on side of caution, place in  OBS for tele, serial exams and repeat labs, fluids, maybe some renal insufficiency.   Gaspar Colaimothy Kateria Cutrona, MD           Gaspar ColaSalib, Jaretzy Lhommedieu, MD  10/31/15 [phone]

## 2015-11-01 MED ORDER — NITROFURANTOIN MONOHYD MACRO 100 MG PO CAPS *I*
100.0000 mg | ORAL_CAPSULE | Freq: Two times a day (BID) | ORAL | 0 refills | Status: AC
Start: 2015-11-01 — End: 2015-11-08

## 2015-11-05 ENCOUNTER — Ambulatory Visit: Payer: Self-pay | Attending: Internal Medicine | Admitting: Internal Medicine

## 2015-11-05 ENCOUNTER — Other Ambulatory Visit
Admission: RE | Admit: 2015-11-05 | Discharge: 2015-11-05 | Disposition: A | Discharge status: Home / Self Care | Payer: Self-pay | Source: Ambulatory Visit | Attending: Internal Medicine | Admitting: Internal Medicine

## 2015-11-05 ENCOUNTER — Encounter: Payer: Self-pay | Admitting: Internal Medicine

## 2015-11-05 ENCOUNTER — Encounter: Payer: Self-pay | Admitting: Gastroenterology

## 2015-11-05 VITALS — BP 118/78 | HR 85 | Ht 76.0 in | Wt 333.0 lb

## 2015-11-05 DIAGNOSIS — R55 Syncope and collapse: Secondary | ICD-10-CM | POA: Insufficient documentation

## 2015-11-05 DIAGNOSIS — N183 Chronic kidney disease, stage 3 (moderate): Secondary | ICD-10-CM | POA: Insufficient documentation

## 2015-11-05 DIAGNOSIS — I10 Essential (primary) hypertension: Secondary | ICD-10-CM | POA: Insufficient documentation

## 2015-11-05 DIAGNOSIS — Z0000 Encounter for general adult medical examination without abnormal findings: Secondary | ICD-10-CM | POA: Insufficient documentation

## 2015-11-05 DIAGNOSIS — E119 Type 2 diabetes mellitus without complications: Secondary | ICD-10-CM | POA: Insufficient documentation

## 2015-11-05 DIAGNOSIS — E039 Hypothyroidism, unspecified: Secondary | ICD-10-CM | POA: Insufficient documentation

## 2015-11-05 DIAGNOSIS — R42 Dizziness and giddiness: Secondary | ICD-10-CM | POA: Insufficient documentation

## 2015-11-05 LAB — BASIC METABOLIC PANEL
Anion Gap: 15 (ref 7–16)
CO2: 24 mmol/L (ref 20–28)
Calcium: 9.2 mg/dL (ref 8.6–10.2)
Chloride: 100 mmol/L (ref 96–108)
Creatinine: 2.7 mg/dL — ABNORMAL HIGH (ref 0.51–0.95)
GFR,Black: 22 * — AB
GFR,Caucasian: 19 * — AB
Glucose: 57 mg/dL — ABNORMAL LOW (ref 60–99)
Lab: 52 mg/dL — ABNORMAL HIGH (ref 6–20)
Potassium: 4.9 mmol/L (ref 3.3–5.1)
Sodium: 139 mmol/L (ref 133–145)

## 2015-11-05 LAB — TSH: TSH: 0.46 u[IU]/mL (ref 0.27–4.20)

## 2015-11-05 LAB — T4, FREE: Free T4: 2 ng/dL — ABNORMAL HIGH (ref 0.9–1.7)

## 2015-11-05 MED ORDER — FUROSEMIDE 40 MG PO TABS *I*
40.0000 mg | ORAL_TABLET | Freq: Two times a day (BID) | ORAL | 2 refills | Status: DC
Start: 2015-11-05 — End: 2016-01-29

## 2015-11-05 NOTE — Progress Notes (Signed)
[facility] Academic Practice Note      Chief Complaint: establish care    HPI:  Anna Ford is a 50 y.o. female with PMH significant for DM, CKD, HTN who presents to establish care.     Pt recently moved to [place] from [place] 1 week ago and presents to establish care after an ED Obs stay for pre-syncope. Review of obs notes indicates nothing was found to indicate a cardiac cause for her pre-syncope, and they suspected a vasovagal episode. At that time was also noted to have apparent worsening of her baseline kidney function, 3.08 up from 2.2, which improved to 2.8 after fluids.     Pt reports a hx of dysautonomia, was seeing Dr. Gearldine Bienenstock, cardiology, in [place]. She gets lightheaded and feet feel numb and tingly. Has nearly passed out several times but has never had a true LOC. It happens every so often, but not in a long time. She thinks that the exhaustion of packing and moving have worsened her sx. After leaving OBs was feeling much better until today when she had recurrent feelings of lightheadedness and foot tingling.     UTI: UCx at ED positive for >100,000 klebsiella. Prescribed macrobid BID x7 days, has been taking, has a few more days left of treatment. Was not having sx of UTI when she went into ED. Reports this is 3rd or 4th time she has been on Abx for UTI but without any sx of UTI. When dx with the prior UTIs she noted a change in her urine and asked for cultures to be done.     HTN/HLD: Reports good BP control in the past with medications. Lisnopril 5 mg, coreg 12.5 BID, crestor, felodipine 5 mg, lasix 80 qAM and 40 qPM. Has a hx of SVT about 15 years ago. Denies hx of heart failure. Takes the lasix for prior issues with LEE and SOB.     DM: In [place] her endocrinologist was Dr. Hulan Fray. On insulin pump. Has enough insulin for another 1-1.5 months. Reports DM usually well controlled, but there have been times when she goes off track. Check sugars 3-4x per day. Has enough supplies for right now. Hx  of BKA of right leg d/t osteomyelitis. Her L partial foot amputation was related to a complex fracture which then formed an ulcer, but was not directly related to the DM.     CKD stage 3: Had a nephrologist in [place].     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 Reports thyroid removal in 1997. synthroid 250 mcg daily. Reports last thyroid check in May and had synthroid dose reduced.     Depression: Lexapro 20 mg daily    HCM:  - Colonoscopy about 5 years ago, and thinks that she needs 5 year follow up.   - Last eye check about 1 year ago. Hx of diabetic retinopathy.   - Agreeable to Hep C and HIV testing    Will need to obtain records from prior PCP, and sub-specialists, including endocrinologist, cardiologist, and nephrologist.     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 Originally from [place] and just wanted to come back home. Reports she didn't have family there and her prior job became overly stressful. Staying with her aunt here in [place], mom and sisters still in [place].     Patient's allergies, past medical, surgical, social and family histories were reviewed,  updated. Previsit worksheet reviewed: Yes    Medications:   Current Outpatient Prescriptions   Medication Sig    furosemide (LASIX) 40 MG tablet Take 1 tablet (40 mg total) by mouth 2 times daily    nitrofurantoin monohydrate macrocrystal (MACROBID) 100 MG capsule Take 1 capsule (100 mg total) by mouth 2 times daily    Insulin Lispro (HUMALOG SC) Inject into the skin    levothyroxine (SYNTHROID, LEVOTHROID) 200 MCG tablet Take 250 mcg by mouth daily (before breakfast)    carvedilol (COREG) 6.25 MG tablet Take 12.5 mg by mouth 2 times daily (with meals)       rosuvastatin (CRESTOR) 40 MG tablet Take 40 mg by mouth daily (with dinner)    felodipine (PLENDIL) 5 MG 24 hr tablet Take 5 mg by mouth every morning   Swallow whole. Do not crush,  break, or chew.    escitalopram (LEXAPRO) 20 MG tablet Take 20 mg by mouth daily    Cholecalciferol (VITAMIN D-3 PO) Take by mouth    lisinopril (PRINIVIL,ZESTRIL) 5 MG tablet Take 5 mg by mouth daily    aspirin 81 MG tablet Take 81 mg by mouth daily    cetirizine (ZYRTEC) 10 MG tablet Take 10 mg by mouth daily    fluticasone (FLONASE) 50 MCG/ACT nasal spray 1 spray by Nasal route daily    montelukast (SINGULAIR) 10 MG tablet Take 10 mg by mouth nightly     No current facility-administered medications for this visit.         Review of Systems - negative except as above.     Exam:     Vitals:    11/05/15 1308 11/05/15 1343 11/05/15 1347 11/05/15 1349   BP: 126/88 140/82 (!) 121/91 118/78   BP Location: Right arm Right arm Right arm Right arm   Patient Position: Sitting Laying Sitting Standing   Cuff Size: large adult adult adult adult   Pulse: 68 78 75 85   Weight: (!) 151 kg (333 lb)      Height: 1.93 m (6\' 4" )        General: Well appearing, pleasant woman in NAD  HEENT: NCAT, MMM, anicteric  Cardiac: RRR no m/r/g  Resp: NWOB, CTAB  Abd: Soft, ND  Ext: Right BKA. LEE warm with pitting edema.   Neuro: Awake, alert. Face symmetric. Moving extremities. Speech and comprehension in tact.   Skin: Many small hyperpigmented lesions noted on the exposed skin of the UEs.     Labs:  Reviewed and notable for:  - Cr 3.08 -> 2.68 -> 2.80    ______________________________________________________________________    Diagnoses and all orders for this visit:    Postural dizziness with presyncope: Pt reports intermittent history of postural presyncope. Was told previously by prior cardiologist that this is related to dysautonomia. On exam today orthostatics reveal a drop in systolic BP from 696 laying to 118 standing, however, without change in HR and without sx. Encouraged additional PO intake today and recommended dropping morning dose of torsemide from 80 mg to 40 mg. Her current torsemide dosing will be 40 mg BID. WIll also  repeat BMP today to assess fors signs of over diuresis.     Diabetes: Pt on insulin pump. Reports hx of diabetic retinopathy and is due for annual eye exam - referral to ophtho made. Also reports prior osteomyelitis resulting in right BKA. She will need referral to endocrine for further management of insulin pump. Will get a hgb a1c with labs  today.   -     AMB REFERRAL TO ENDOCRINOLOGY  -     Hemoglobin A1c; Future  -     AMB REFERRAL TO OPHTHALMOLOGY    High blood pressure: BP well controlled today, perhaps too well controlled given orthostatic BP drop and intermittent episodes of postural pre-syncope. Will reduce lasix dosing as above and follow up in about 6 weeks to ensure pt is doing well.   -     AMB REFERRAL TO CARDIOLOGY  -     furosemide (LASIX) 40 MG tablet; Take 1 tablet (40 mg total) by mouth 2 times daily    CKD (chronic kidney disease), stage 3 (moderate): Repeat BMP today. She wants to establish care with a new nephrologist and will refer a local nephrologist.   -     Basic metabolic panel -> Cr down trending to 2.7 from 2.8 at last check.   -     AMB REFERRAL TO NEPHROLOGY    Hypothyroidism: Pt reports several recent dosage adjustments to synthroid, last one was in May. Will check TSH/FT4 today to ensure current dosing appropriate.   -     AMB REFERRAL TO ENDOCRINOLOGY  -     TSH  -     T4, free    Healthcare maintenance  -     HIV 1&2 antigen/antibody; Future  -     Hepatitis C antibody; Future      Follow up in 6-8 weeks.     Gwynneth AlimentAnat Gabriel Paulding, MD 11/05/2015 at 10:30 PM   PGY3 Internal Medicine  Pager: [phone]

## 2015-11-05 NOTE — Patient Instructions (Signed)
Decrease you lasix to 40 mg twice a day. Drink some extra fluids today. If dizziness getting worse go back to ED.     Get lab work done on your way out today if you have time, if not, sometime in the next week is fine.     Follow up with endocrinology, cardiology, and nephrology.

## 2015-11-06 ENCOUNTER — Telehealth: Payer: Self-pay

## 2015-11-06 LAB — HEMOGLOBIN A1C: Hemoglobin A1C: 10.6 % — ABNORMAL HIGH (ref 4.0–6.0)

## 2015-11-06 LAB — HIV 1&2 ANTIGEN/ANTIBODY: HIV 1&2 ANTIGEN/ANTIBODY: NONREACTIVE

## 2015-11-06 LAB — HEPATITIS C ANTIBODY: Hep C Ab: NEGATIVE

## 2015-11-06 NOTE — Telephone Encounter (Signed)
Please see referral workque.

## 2015-11-07 ENCOUNTER — Telehealth: Payer: Self-pay

## 2015-11-07 NOTE — Telephone Encounter (Signed)
Enc Provider: Gwynneth AlimentKohn, Anat, MD    Diagnosis: Diabetes  Hypothyroidism, unspecified type   Department: [facility]     Review chart view

## 2015-11-07 NOTE — Telephone Encounter (Signed)
NA

## 2015-11-08 NOTE — Telephone Encounter (Signed)
Attempt 1 of 2: no answer, message left by Michelle T.

## 2015-11-11 NOTE — Telephone Encounter (Signed)
Patient Anna Ford called back to schedule her NPV. Writer scheduled NA 12/1 4pm with Dr. Michail SermonAzim. Anna Ford will need new patient packet mailed to her.

## 2015-11-12 ENCOUNTER — Telehealth: Payer: Self-pay | Admitting: Internal Medicine

## 2015-11-12 NOTE — Telephone Encounter (Signed)
DR. Ladona RidgelAYLOR WILL TAKE THIS PT ON NPV   NIECE WILL CALL FOR APPT

## 2015-11-13 ENCOUNTER — Encounter: Payer: Self-pay | Admitting: Internal Medicine

## 2015-11-13 NOTE — Telephone Encounter (Signed)
Noted  

## 2015-12-02 ENCOUNTER — Encounter: Payer: Self-pay | Admitting: Internal Medicine

## 2015-12-02 DIAGNOSIS — Z89511 Acquired absence of right leg below knee: Secondary | ICD-10-CM

## 2015-12-02 DIAGNOSIS — I10 Essential (primary) hypertension: Secondary | ICD-10-CM

## 2015-12-02 DIAGNOSIS — Z9989 Dependence on other enabling machines and devices: Secondary | ICD-10-CM

## 2015-12-02 DIAGNOSIS — N189 Chronic kidney disease, unspecified: Secondary | ICD-10-CM

## 2015-12-02 DIAGNOSIS — G4733 Obstructive sleep apnea (adult) (pediatric): Secondary | ICD-10-CM

## 2015-12-04 DIAGNOSIS — Z89511 Acquired absence of right leg below knee: Secondary | ICD-10-CM | POA: Insufficient documentation

## 2015-12-04 MED ORDER — LISINOPRIL 5 MG PO TABS *I*
5.0000 mg | ORAL_TABLET | Freq: Every day | ORAL | 3 refills | Status: DC
Start: 2015-12-04 — End: 2016-01-10

## 2015-12-04 NOTE — Telephone Encounter (Signed)
Referral to orthotics/prosthesis sent to assess R BKA prosthesis as pt reporting it is not fitting correctly.     Also [facility] a [facility], as [facility] is booking out to May.     [facility] [address]

## 2015-12-05 DIAGNOSIS — G4733 Obstructive sleep apnea (adult) (pediatric): Secondary | ICD-10-CM | POA: Insufficient documentation

## 2015-12-05 DIAGNOSIS — Z9989 Dependence on other enabling machines and devices: Secondary | ICD-10-CM | POA: Insufficient documentation

## 2015-12-05 MED ORDER — OTHER SUPPLY *A*
0 refills | Status: AC
Start: 2015-12-05 — End: ?

## 2015-12-05 NOTE — Telephone Encounter (Signed)
Please call [facility] to see if a sooner appointment can be made for Ms. Anna Ford. She is on pump insulin and will run out of insulin to last through the end of the month.     A script for CPAP supplies has also been placed in the "signed rx" bin and will need to be mailed to her.

## 2015-12-11 ENCOUNTER — Other Ambulatory Visit: Payer: Self-pay | Admitting: Cardiology

## 2015-12-11 ENCOUNTER — Ambulatory Visit: Payer: PRIVATE HEALTH INSURANCE | Attending: Cardiology | Admitting: Internal Medicine

## 2015-12-11 ENCOUNTER — Encounter: Payer: Self-pay | Admitting: Internal Medicine

## 2015-12-11 VITALS — BP 130/80 | HR 77 | Ht 76.0 in | Wt 330.0 lb

## 2015-12-11 DIAGNOSIS — I471 Supraventricular tachycardia: Secondary | ICD-10-CM

## 2015-12-11 DIAGNOSIS — G4733 Obstructive sleep apnea (adult) (pediatric): Secondary | ICD-10-CM

## 2015-12-11 DIAGNOSIS — Z9989 Dependence on other enabling machines and devices: Secondary | ICD-10-CM

## 2015-12-11 DIAGNOSIS — I10 Essential (primary) hypertension: Secondary | ICD-10-CM

## 2015-12-11 MED ORDER — FELODIPINE 2.5 MG PO TB24 *I*
2.5000 mg | ORAL_TABLET | Freq: Every day | ORAL | 1 refills | Status: DC
Start: 2015-12-11 — End: 2016-05-22

## 2015-12-11 NOTE — Progress Notes (Addendum)
Cardiology Consult Visit     Dear Windy Cannyarroll, Thomas, MD,PHD,    I had the pleasure of seeing your patient, Anna Ford, in our [facility] on 12/11/2015.    HPI:  Anna Ford is a 50 y.o. 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     Anna Ford recently moved to [place] to be closer to family from [place] where she used to follow with a cardiologist for her presyncopal symptoms. 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 She states this does not correlate with hypoglycemic episodes and she has never fully synopsized. She used to be on 10 mg of felodipine for her HTN for years until she was reduced to 5 mg by her cardiologist for these symptoms. She recently established care with a PCP who preformed orthostatics in the office. Her SBP changed by more than 20 (SBP 150-->118) without change in HR. She denies feelings of racing HR, palpations, SOB or tunnel vision with these episodes. She does state her legs go numb and she has feelings of disassociation along with her lightheadedness.      She also notes that at night when she is wearing her CPAP machine she will still awaken feeling her heart pounding or SOB sometimes several times a night. She also reports she thinks she needs a new mask.     PAST MEDICAL HISTORY:  Past Medical History:   Diagnosis Date    Depression     Diabetes mellitus     Hypertension     Osteomyelitis     Thyroid disease     hypothyroid     REVIEW OF SYSTEMS:  10 point review of systems otherwise negative except as per HPI.    ALLERGIES:  Allergies   Allergen Reactions     Travatan Z [Travoprost] Other (See Comments)           CURRENT MEDICATIONS:  Current Outpatient Prescriptions on File Prior to Visit   Medication Sig Dispense Refill    other supply Dispense CPAP supplies and accessories 1 each 0    lisinopril (PRINIVIL,ZESTRIL) 5 MG tablet Take 1 tablet (5 mg total) by mouth daily 30 tablet 3    furosemide (LASIX) 40 MG tablet Take 1 tablet (40 mg total) by mouth 2 times daily 60 tablet 2    Insulin Lispro (HUMALOG SC) Inject into the skin      levothyroxine (SYNTHROID, LEVOTHROID) 200 MCG tablet Take 250 mcg by mouth daily (before breakfast)      carvedilol (COREG) 6.25 MG tablet Take 12.5 mg by mouth 2 times daily (with meals)         rosuvastatin (CRESTOR) 40 MG tablet Take 40 mg by mouth daily (with dinner)      escitalopram (LEXAPRO) 20 MG tablet Take 20 mg by mouth daily      Cholecalciferol (VITAMIN D-3 PO) Take by mouth      aspirin 81 MG tablet Take 81 mg by mouth daily      cetirizine (ZYRTEC) 10 MG tablet Take 10 mg by mouth daily      fluticasone (FLONASE) 50 MCG/ACT nasal  spray 1 spray by Nasal route daily      montelukast (SINGULAIR) 10 MG tablet Take 10 mg by mouth nightly       No current facility-administered medications on file prior to visit.      VITALS: BP 130/80   Pulse 77   Ht 1.93 m (6\' 4" )   Wt (!) 149.7 kg (330 lb)   BMI 40.17 kg/m2  PHYSICAL EXAM:  CONSTITUTIONAL: Pleasant and interactive  NEURO: Alert and oriented in no acute distress  HENT: MMM, oropharynx clear, anicteric sclerae  LYMPH: Supple without lymphadenopathy  NECK VASCULAR: JVP is approximately 6 cm above the right atrium.  There are no carotid bruits appreciated bilaterally.  CARDIOVASCULAR: A comprehensive cardiovascular exam was performed with details as follows: Regular rhythm, normal S1, prominent S2 no S3 or S4, there are no murmurs or rubs appreciated. PMI is normal in position in character.  2+ radial and dorsalis pedis pulses with no lower extremity edema noted.  PULM:  Lungs are clear to auscultation bilaterally without wheezes, rales, or rhonchi.  GI: Soft, nontender, normal active bowel sounds, no abdominal bruits noted.  MSK/EXTREMITIES: No clubbing, cyanosis, and edema as noted above. Left leg BKA with prosthesis. right leg in brace up to knee. Multiple scars on forearms.     LABS:  No results found for: CHOL, HDL, LDLC  Lab Results   Component Value Date/Time    NA 139 11/05/2015 02:45 PM    K 4.9 11/05/2015 02:45 PM     ECG:  NSR HR 77, normal axis, possible RA enlargement.     CARDIAC STUDIES:  none    ASSESSMENT AND RECOMMENDATIONS:  50 yo woman with a history of HTN, HLD, type I DM since age 70 with peripheral neuropathy and s/p left BKA, autonomic dysreflexia, OSA compliant with CPAP, CKD III, Hashimoto's thyroiditis s/p thyroidectomy, SVT s/p ablation in [place] in 2002, prior BKA who presents today for further evaluation of her presyncopal episodes.     Presyncope: medication vs. Autonomic dysreflexia (in the setting of DM)  -plan to reduce felodipine to 2.5 mg daily  -ECHO in 6 months at follow up    HTN/HLD  -continue lisinopril 5 mg. Hopefully this can be maximized if BP becomes an issue.   -continue coreg 6.25 mg BID  -continue Crestor.     OSA compliant with CPAP with persistent symptoms.   -referral to sleep medicine to further evaluate settings. May require adjustment.     RTC in 3-6 months for BP check and ECHO    As always, please do not hesitate to contact us if questions or issues arise.  I hope this has been helpful to you and I look forward to continuing to work with you in the future.      Sincerely,    Katrinka Blazing, MD  Cardiology Fellow  [facility] Cardiology at [facility]      Cardiology Attending Addendum  Patient seen and evaluated with Dr Luisa Hart- I agree with the above findings and assessment.  Note edited as appropriate.  Anna Ford is a 49 y.o. female with complications of longstanding DM1 and morbid obesity that was referred for transfer of cardiac  care.  She continues to have orthostatic symptoms secondary to autonomic insufficiency.  Will decrease CCB.  Remainder of medical therapy as above.          Theressa Stamps, MD

## 2015-12-12 ENCOUNTER — Encounter: Payer: Self-pay | Admitting: Internal Medicine

## 2015-12-12 MED ORDER — LEVOTHYROXINE SODIUM 125 MCG PO TABS *I*
250.0000 ug | ORAL_TABLET | Freq: Every day | ORAL | 5 refills | Status: DC
Start: 2015-12-12 — End: 2016-08-26

## 2015-12-12 NOTE — Telephone Encounter (Signed)
Script sent  

## 2015-12-15 LAB — EKG 12-LEAD
P: 71 deg
PR: 185 ms
QRS: 26 deg
QRSD: 94 ms
QT: 390 ms
QTc: 442 ms
Rate: 77 {beats}/min
Severity: NORMAL
T: 21 deg

## 2015-12-26 ENCOUNTER — Encounter: Payer: Self-pay | Admitting: Internal Medicine

## 2015-12-26 ENCOUNTER — Ambulatory Visit: Payer: Self-pay

## 2015-12-26 DIAGNOSIS — Z89511 Acquired absence of right leg below knee: Secondary | ICD-10-CM

## 2015-12-26 DIAGNOSIS — E109 Type 1 diabetes mellitus without complications: Secondary | ICD-10-CM

## 2015-12-26 NOTE — Progress Notes (Signed)
RT BK Prosthesis  Evaluation         Subjective:  Anna Ford is a 50 y.o. y.o. female with a right proximal 1/3rd trans tibial amputation  [place]  She is an independent ambulator  She uses and AFO on the LT side    She comes in today due to compromised fit and function    Objective:   Evaluation shows a very pleasant female   She stated that she received her current prosthesis in 2015   Her prosthesis consists of A [employer] FS 2 Foot Category 8 Size 30 foot, Harmony HD Vacuum Unit, Laminated Socket with flexible inner socket, Alps size 32 3mm thick liner, and Dermo Proflex Sleeve    Her prosthesis requires multiple repairs:   Her sleeve has multiple holes.  She has a size 3 sleeve, grey, standard length   Her liner is cracked on the inside, discolored, and filthy   She wears liner liners which are discolored and torn   Her HD unit has not tubing, and she stated that the tube broke off some time in the past   The soling material that is being used as a heel bumper has slid posteriorly, softening the heel bumper and resulting in a soft heel and knee extension thrust   Spectrasock was completely torn almost in 2 pieces    Assessment:  Her sleeve needs to be replaced to regain suspension  Her harmony tubing was replaced today, as was her spectra sock  New liners, socks, liner liners, and heel bumpers will need to be ordered once we have authorization  She will return in a few weeks for delivery    She had a gauze pad over her medial distal tibia, that she reports has been an issue for some time.  If I can find a 6mm liner, she may benefit from the added cushioning and reduce the need for multiply socks, as were used today.    Plan:   She will return in a few weeks for delivery of various supplies, and for more repairs to be done to her prosthesis

## 2015-12-27 DIAGNOSIS — Z89511 Acquired absence of right leg below knee: Secondary | ICD-10-CM

## 2015-12-27 DIAGNOSIS — E109 Type 1 diabetes mellitus without complications: Secondary | ICD-10-CM

## 2015-12-27 NOTE — Progress Notes (Signed)
O&P AUTHORIZATION REQUEST FORM    Name: Anna Ford  DOB:  Feb 23, 1966    Patient Activity Level:  moderate  Side:  right    Diagnosis:      ICD-10-CM ICD-9-CM   1. S/P BKA (below knee amputation), right Z89.511 V49.75   2. Type I diabetes mellitus E10.9 250.01       Recommended Device:  supplies    Code Selection:    [facility] Code L-Code # Long Description   [id number] 6 Prosthetics sock, multiple ply, below knee    [id number] L8470 6 Prosthetic sock, single ply, below knee    [id number] L5685 1 Below knee Sleeve, suspension/sleeve      [id number] 2 Gel Liner Prefab, for use WITHOUT lock     Initials:   stb  I have reviewed and approve the codes above based on my treatment recommendation     Justification:    A sealing sleeve is worn over a prosthetic socket.  The lower half secures to the prosthetic socket, while the upper half secures to the patient's leg through surface tension / friction.  In a suction system this also serves as a barrier to prevent air from escaping.      A cushioned gel liner is used as an interface between the rigid materials of a prosthetic socket and the user's skin.  In some cases this liner can be integrated with a suspension method using suction or other methods.  This is a critical element of a prosthesis.  Multiple items may be required so that one can be laundered while the other is worn.     Prosthetic socks are used to manage volume as a limb matures and throughout the day.  Volume fluctuations are common to many amputees.  Socks ensure a proper fit to prevent skin breakdown.  Often multiple socks are worn on top of each-other to manage volume.  It is essential that a prosthetic user have sufficient clean socks to manage their volume effectively.      Vanetta MuldersShawn Kemiyah Tarazon, MS, [facility]  Orthotics and Prosthetics Department  [phone]

## 2015-12-27 NOTE — Telephone Encounter (Signed)
[facility]. Pt reports she has not heard from anyone regarding an appointment, can someone please look into this and get back to the patient. Thank you!

## 2015-12-30 ENCOUNTER — Observation Stay
Admission: EM | Admit: 2015-12-30 | Discharge: 2015-12-31 | Disposition: A | Discharge status: Home / Self Care | Payer: PRIVATE HEALTH INSURANCE | Source: Ambulatory Visit | Attending: Emergency Medicine | Admitting: Emergency Medicine

## 2015-12-30 ENCOUNTER — Telehealth: Payer: Self-pay | Admitting: Internal Medicine

## 2015-12-30 ENCOUNTER — Other Ambulatory Visit: Payer: Self-pay | Admitting: Cardiology

## 2015-12-30 ENCOUNTER — Encounter: Payer: Self-pay | Admitting: Internal Medicine

## 2015-12-30 DIAGNOSIS — Z9989 Dependence on other enabling machines and devices: Secondary | ICD-10-CM

## 2015-12-30 DIAGNOSIS — G4733 Obstructive sleep apnea (adult) (pediatric): Secondary | ICD-10-CM

## 2015-12-30 DIAGNOSIS — G904 Autonomic dysreflexia: Secondary | ICD-10-CM | POA: Diagnosis present

## 2015-12-30 DIAGNOSIS — N184 Chronic kidney disease, stage 4 (severe): Secondary | ICD-10-CM | POA: Insufficient documentation

## 2015-12-30 DIAGNOSIS — I10 Essential (primary) hypertension: Secondary | ICD-10-CM | POA: Diagnosis present

## 2015-12-30 DIAGNOSIS — Z794 Long term (current) use of insulin: Secondary | ICD-10-CM | POA: Insufficient documentation

## 2015-12-30 DIAGNOSIS — R000 Tachycardia, unspecified: Secondary | ICD-10-CM

## 2015-12-30 DIAGNOSIS — I471 Supraventricular tachycardia: Secondary | ICD-10-CM | POA: Diagnosis present

## 2015-12-30 DIAGNOSIS — E109 Type 1 diabetes mellitus without complications: Secondary | ICD-10-CM | POA: Diagnosis present

## 2015-12-30 DIAGNOSIS — E1022 Type 1 diabetes mellitus with diabetic chronic kidney disease: Secondary | ICD-10-CM | POA: Insufficient documentation

## 2015-12-30 DIAGNOSIS — I129 Hypertensive chronic kidney disease with stage 1 through stage 4 chronic kidney disease, or unspecified chronic kidney disease: Secondary | ICD-10-CM | POA: Insufficient documentation

## 2015-12-30 DIAGNOSIS — E039 Hypothyroidism, unspecified: Secondary | ICD-10-CM | POA: Insufficient documentation

## 2015-12-30 DIAGNOSIS — R55 Syncope and collapse: Principal | ICD-10-CM | POA: Insufficient documentation

## 2015-12-30 HISTORY — DX: Dependence on other enabling machines and devices: Z99.89

## 2015-12-30 HISTORY — DX: Supraventricular tachycardia, unspecified: I47.10

## 2015-12-30 HISTORY — DX: Type 1 diabetes mellitus without complications: E10.9

## 2015-12-30 HISTORY — DX: Morbid (severe) obesity due to excess calories: E66.01

## 2015-12-30 HISTORY — DX: Supraventricular tachycardia: I47.1

## 2015-12-30 HISTORY — DX: Body Mass Index (BMI) 40.0 and over, adult: Z684

## 2015-12-30 HISTORY — DX: Obstructive sleep apnea (adult) (pediatric): G47.33

## 2015-12-30 HISTORY — DX: Hyperlipidemia, unspecified: E78.5

## 2015-12-30 HISTORY — DX: Autonomic dysreflexia: G90.4

## 2015-12-30 HISTORY — DX: Chronic kidney disease, stage 4 (severe): N18.4

## 2015-12-30 LAB — TROPONIN T
Troponin T: 0.04 ng/mL — ABNORMAL HIGH (ref 0.00–0.02)
Troponin T: 0.03 ng/mL — ABNORMAL HIGH (ref 0.00–0.02)

## 2015-12-30 LAB — CBC AND DIFFERENTIAL
Baso # K/uL: 0 10*3/uL (ref 0.0–0.1)
Basophil %: 0.1 %
Eos # K/uL: 0 10*3/uL (ref 0.0–0.4)
Eosinophil %: 0 %
Hematocrit: 39 % (ref 34–45)
Hemoglobin: 12.1 g/dL (ref 11.2–15.7)
IMM Granulocytes #: 0 10*3/uL (ref 0.0–0.1)
IMM Granulocytes: 0.4 %
Lymph # K/uL: 1.8 10*3/uL (ref 1.2–3.7)
Lymphocyte %: 27.1 %
MCH: 22 pg — ABNORMAL LOW (ref 26–32)
MCHC: 31 g/dL — ABNORMAL LOW (ref 32–36)
MCV: 71 fL — ABNORMAL LOW (ref 79–95)
Mono # K/uL: 0.6 10*3/uL (ref 0.2–0.9)
Monocyte %: 8.4 %
Neut # K/uL: 4.3 10*3/uL (ref 1.6–6.1)
Nucl RBC # K/uL: 0 10*3/uL (ref 0.0–0.0)
Nucl RBC %: 0 /100 WBC (ref 0.0–0.2)
Platelets: 345 10*3/uL (ref 160–370)
RBC: 5.5 MIL/uL — ABNORMAL HIGH (ref 3.9–5.2)
RDW: 14 % (ref 11.7–14.4)
Seg Neut %: 64 %
WBC: 6.8 10*3/uL (ref 4.0–10.0)

## 2015-12-30 LAB — RUQ PANEL (ED ONLY)
ALT: 26 U/L (ref 0–35)
AST: 24 U/L (ref 0–35)
Albumin: 4.5 g/dL (ref 3.5–5.2)
Alk Phos: 99 U/L (ref 35–105)
Amylase: 52 U/L (ref 28–100)
Bilirubin,Direct: <0.2 mg/dL (ref 0.0–0.3)
Bilirubin,Total: 0.2 mg/dL (ref 0.0–1.2)
Globulin: 3.8 g/dL (ref 2.7–4.3)
Lipase: 32 U/L (ref 13–60)
Total Protein: 8.3 g/dL — ABNORMAL HIGH (ref 6.3–7.7)

## 2015-12-30 LAB — URINALYSIS REFLEX TO CULTURE
Nitrite,UA: POSITIVE — AB
Protein,UA: 150 mg/dL — AB
Specific Gravity,UA: 1.015 (ref 1.001–1.030)
pH,UA: 6.5 (ref 5.0–8.0)

## 2015-12-30 LAB — VENOUS BLOOD GAS
Base Excess,VENOUS: 2
Bicarbonate,VENOUS: 28 mmol/L (ref 22–30)
CO2 (Calc),VENOUS: 30 mmol/L (ref 22–31)
CO: 0.8 % (ref 0.0–1.4)
FO2 HB,VENOUS: 54 % — ABNORMAL LOW (ref 63–83)
Hemoglobin: 14 g/dL (ref 11.2–15.7)
Methemoglobin: 0.3 % (ref 0.0–1.0)
PCO2,VENOUS: 50 mm Hg (ref 40–50)
PH,VENOUS: 7.37 (ref 7.32–7.42)
PO2,VENOUS: 34 mm Hg (ref 25–43)

## 2015-12-30 LAB — POCT GLUCOSE
Glucose POCT: 145 mg/dL — ABNORMAL HIGH (ref 60–99)
Glucose POCT: 216 mg/dL — ABNORMAL HIGH (ref 60–99)
Glucose POCT: 176 mg/dL — ABNORMAL HIGH (ref 60–99)

## 2015-12-30 LAB — BASIC METABOLIC PANEL
Anion Gap: 15 (ref 7–16)
CO2: 25 mmol/L (ref 20–28)
Calcium: 9.9 mg/dL (ref 8.6–10.2)
Chloride: 100 mmol/L (ref 96–108)
Creatinine: 2.89 mg/dL — ABNORMAL HIGH (ref 0.51–0.95)
GFR,Black: 21 * — AB
GFR,Caucasian: 18 * — AB
Glucose: 98 mg/dL (ref 60–99)
Lab: 53 mg/dL — ABNORMAL HIGH (ref 6–20)
Potassium: 4.5 mmol/L (ref 3.3–5.1)
Sodium: 140 mmol/L (ref 133–145)

## 2015-12-30 LAB — URINE MICROSCOPIC (IQ200)
RBC,UA: >50 /hpf — AB (ref 0–2)
WBC,UA: >50 /hpf — AB (ref 0–5)

## 2015-12-30 LAB — CK ISOENZYMES
CK: 469 U/L — ABNORMAL HIGH (ref 34–145)
Mass CKMB: 6.1 ng/mL — ABNORMAL HIGH (ref 0.0–5.3)
Relative Index: 1.3 % (ref 0.0–5.0)

## 2015-12-30 LAB — BETA HYDROXYBUTYRATE: Beta Hydroxybutyrate: 0.06 mmol/L (ref 0.02–0.27)

## 2015-12-30 LAB — TSH: TSH: 5.44 u[IU]/mL — ABNORMAL HIGH (ref 0.27–4.20)

## 2015-12-30 MED ORDER — ROSUVASTATIN CALCIUM 20 MG PO TABS *I*
40.0000 mg | ORAL_TABLET | Freq: Every day | ORAL | Status: DC
Start: 2015-12-30 — End: 2015-12-30

## 2015-12-30 MED ORDER — ESCITALOPRAM OXALATE 10 MG PO TABS *I*
20.0000 mg | ORAL_TABLET | Freq: Every day | ORAL | Status: DC
Start: 2015-12-31 — End: 2015-12-31
  Administered 2015-12-31: 20 mg via ORAL
  Filled 2015-12-30: qty 2

## 2015-12-30 MED ORDER — GLUCOSE 40 % PO GEL *I*
15.0000 g | ORAL | Status: DC | PRN
Start: 2015-12-30 — End: 2015-12-31

## 2015-12-30 MED ORDER — INSULIN SQ (HUMALOG 100 UNITS/ML PUMP INFUSION (BASAL RATE) *I*
SUBCUTANEOUS | Status: DC
Start: 2015-12-30 — End: 2015-12-31
  Filled 2015-12-30: qty 10

## 2015-12-30 MED ORDER — LISINOPRIL 5 MG PO TABS *I*
5.0000 mg | ORAL_TABLET | Freq: Every day | ORAL | Status: DC
Start: 2015-12-30 — End: 2015-12-31
  Administered 2015-12-30: 5 mg via ORAL
  Filled 2015-12-30: qty 1

## 2015-12-30 MED ORDER — FUROSEMIDE 40 MG PO TABS *I*
40.0000 mg | ORAL_TABLET | Freq: Two times a day (BID) | ORAL | Status: DC
Start: 2015-12-30 — End: 2015-12-31
  Administered 2015-12-30 – 2015-12-31 (×2): 40 mg via ORAL
  Filled 2015-12-30 (×2): qty 1

## 2015-12-30 MED ORDER — CARVEDILOL 12.5 MG PO TABS *I*
12.5000 mg | ORAL_TABLET | Freq: Two times a day (BID) | ORAL | Status: DC
Start: 2015-12-30 — End: 2015-12-31
  Administered 2015-12-30 – 2015-12-31 (×2): 12.5 mg via ORAL
  Filled 2015-12-30 (×2): qty 1

## 2015-12-30 MED ORDER — GLUCAGON HCL (RDNA) 1 MG IJ SOLR *WRAPPED*
1.0000 mg | INTRAMUSCULAR | Status: DC | PRN
Start: 2015-12-30 — End: 2015-12-31

## 2015-12-30 MED ORDER — ASPIRIN 81 MG PO CHEW *I*
81.0000 mg | CHEWABLE_TABLET | Freq: Every day | ORAL | Status: DC
Start: 2015-12-31 — End: 2015-12-31
  Administered 2015-12-31: 81 mg via ORAL
  Filled 2015-12-30: qty 1

## 2015-12-30 MED ORDER — INSULIN SQ BOLUS (HUMALOG) 100 UNITS/ML FROM PUMP *I*
Freq: Three times a day (TID) | SUBCUTANEOUS | Status: DC
Start: 2015-12-30 — End: 2015-12-31
  Administered 2015-12-30: 26.9 [IU] via SUBCUTANEOUS
  Administered 2015-12-31: 2.4 [IU] via SUBCUTANEOUS
  Administered 2015-12-31: 15.8 [IU] via SUBCUTANEOUS
  Filled 2015-12-30: qty 10

## 2015-12-30 MED ORDER — LEVOTHYROXINE SODIUM 125 MCG PO TABS *I*
250.0000 ug | ORAL_TABLET | Freq: Every morning | ORAL | Status: DC
Start: 2015-12-31 — End: 2015-12-31
  Administered 2015-12-31: 250 ug via ORAL
  Filled 2015-12-30: qty 2

## 2015-12-30 MED ORDER — FELODIPINE 2.5 MG PO TB24 *I*
2.5000 mg | ORAL_TABLET | Freq: Every day | ORAL | Status: DC
Start: 2015-12-30 — End: 2015-12-31
  Administered 2015-12-30: 2.5 mg via ORAL
  Filled 2015-12-30: qty 1

## 2015-12-30 MED ORDER — DEXTROSE 50 % IV SOLN *I*
25.0000 g | INTRAVENOUS | Status: DC | PRN
Start: 2015-12-30 — End: 2015-12-31

## 2015-12-30 MED ORDER — CETIRIZINE HCL 10 MG PO TABS *I*
5.0000 mg | ORAL_TABLET | Freq: Every evening | ORAL | Status: DC
Start: 2015-12-30 — End: 2015-12-31
  Administered 2015-12-30: 5 mg via ORAL
  Filled 2015-12-30: qty 1

## 2015-12-30 MED ORDER — SODIUM CHLORIDE 0.9 % IV SOLN WRAPPED *I*
125.0000 mL/h | Status: DC
Start: 2015-12-30 — End: 2015-12-31
  Administered 2015-12-30 – 2015-12-31 (×9): 125 mL/h via INTRAVENOUS

## 2015-12-30 MED ORDER — INSULIN SQ BOLUS (HUMALOG) 100 UNITS/ML FROM PUMP *I*
SUBCUTANEOUS | Status: DC | PRN
Start: 2015-12-30 — End: 2015-12-31
  Filled 2015-12-30: qty 10

## 2015-12-30 MED ORDER — INSULIN SQ (HUMALOG 100 UNITS/ML) PUMP REFILL *I*
SUBCUTANEOUS | Status: DC | PRN
Start: 2015-12-30 — End: 2015-12-31
  Filled 2015-12-30: qty 3

## 2015-12-30 NOTE — ED Obs Notes (Signed)
ED OBSERVATION ADMISSION NOTE    Patient seen by me on arrival date of 12/30/2015 at 4:27 PM    Current patient status: Observation    History     Chief Complaint   Patient presents with    Syncope     Passed out twice this morning.  Does not recall events leading up to either episode.  Only current complaint is nausea.  EMS reports BG 194.     HPI Comments: Patient is a 50 year old female with history of HTN, hypothyroidism, type 1 DM, CKD and right BKA who is admitted to the [facility] for two syncopal episodes. She reports she woke up sometime after 12am with her face tingling. These symptoms usually occur when her BG is very high, so she checked it and it read "high". States she took the corrected dose of insulin and went back to bed. Two hours later she woke up, was symptom free and checked her sugar again and it still read "High". She corrected it again and went back to bed. At 6am she woke up and checked her sugar and it was 409. She called her pcp office and was to stay home and rest and push fluids. For a third time she corrected her sugar with insulin.  Around 9 am she got up to move her car and felt lightheaded and dizzy. States her sister was talking to her and and she felt like she could not hear her. A few seconds later she had a syncopal episode.  Her sister helped her up and she started to walk towards the stairs to fix her prosthesis. While she was walking up the stairs she started to feel lightheaded again and had another syncopal episode. At this time her sister called 33 and she went to the ED. On EMS arrival her BG was 194. She denies ever having any SOB, chest pain or headache. While in the ED her symptoms improved. She had an elevated troponin which was believed to be due to her CKD. She also had an elevated TSH. Currently she is asymptomatic. Reports her sugars usually run from 120-200.       History provided by:  Patient  Language interpreter used: No    Is this ED visit related to civilian  activity for income:  Not work related      Past Medical History:   Diagnosis Date    Autonomic dysreflexia     CKD (chronic kidney disease) stage 4, GFR 15-29 ml/min     Depression     DM type 1 (diabetes mellitus, type 1)     HLD (hyperlipidemia)     Hypertension     Morbid obesity with BMI of 40.0-44.9, adult     OSA on CPAP     Osteomyelitis     SVT (supraventricular tachycardia)     s/p ablation in 2002    Thyroid disease     hypothyroid       Past Surgical History:   Procedure Laterality Date    cataracts Bilateral     left foot amputation Left     broke foot, never healed    LEG AMPUTATION BELOW KNEE Right     THYROIDECTOMY         History reviewed. No pertinent family history.    Social History      reports that she has never smoked. She has never used smokeless tobacco. She reports that she drinks alcohol. She reports that she does not  use illicit drugs. Her sexual activity history is not on file.    Living Situation     Questions Responses    Patient lives with     Comment: staying with her aunt     Homeless     Caregiver for other family member     External Services     Employment Employed    Comment: Glass blower/designer at [employer]   Review of Systems   Neurological: Positive for dizziness, syncope and light-headedness.   All other systems reviewed and are negative.      Physical Exam   BP 140/84   Pulse 80   Temp 36.2 C (97.2 F) (Oral)    Resp 18   Ht 1.93 m ('6\' 4"'$ )   Wt (!) 149.7 kg (330 lb)   SpO2 99%   BMI 40.17 kg/m2    Physical Exam   Constitutional: Vital signs are normal. She appears well-developed. No distress.   HENT:   Head: Normocephalic and atraumatic.   Eyes: EOM are normal. Pupils are equal, round, and reactive to light.   Neck: Normal range of motion. Neck supple.   Cardiovascular: Normal rate, regular rhythm and normal heart sounds.    Pulmonary/Chest: Effort normal and breath sounds normal.   Abdominal: Soft. Bowel sounds  are normal.   Musculoskeletal: Normal range of motion.   Right BKA   Neurological: She is alert. She has normal strength. She is not disoriented. No cranial nerve deficit or sensory deficit. GCS eye subscore is 4. GCS verbal subscore is 5. GCS motor subscore is 6.   Skin: Skin is warm and dry.   Psychiatric: She has a normal mood and affect.   Nursing note and vitals reviewed.      Tests    YHC:WCBJSE EKG, normal sinus rhythm, unchanged from previous tracings    Labs:   All labs in the last 24 hours:   Recent Results (from the past 24 hour(s))   POCT glucose    Collection Time: 12/30/15 11:27 AM   Result Value Ref Range    Glucose POCT 145 (H) 60 - 99 mg/dL   CBC and differential    Collection Time: 12/30/15 12:27 PM   Result Value Ref Range    WBC 6.8 4.0 - 10.0 THOU/uL    RBC 5.5 (H) 3.9 - 5.2 MIL/uL    Hemoglobin 12.1 11.2 - 15.7 g/dL    Hematocrit 39 34 - 45 %    MCV 71 (L) 79 - 95 fL    MCH 22 (L) 26 - 32 pg    MCHC 31 (L) 32 - 36 g/dL    RDW 14.0 11.7 - 14.4 %    Platelets 345 160 - 370 THOU/uL    Seg Neut % 64.0 %    Lymphocyte % 27.1 %    Monocyte % 8.4 %    Eosinophil % 0.0 %    Basophil % 0.1 %    Neut # K/uL 4.3 1.6 - 6.1 THOU/uL    Lymph # K/uL 1.8 1.2 - 3.7 THOU/uL    Mono # K/uL 0.6 0.2 - 0.9 THOU/uL    Eos # K/uL 0.0 0.0 - 0.4 THOU/uL    Baso # K/uL 0.0 0.0 - 0.1 THOU/uL    Nucl RBC % 0.0 0.0 - 0.2 /100 WBC    Nucl RBC # K/uL  0.0 0.0 - 0.0 THOU/uL    IMM Granulocytes # 0.0 0.0 - 0.1 THOU/uL    IMM Granulocytes 0.4 %   Basic metabolic panel    Collection Time: 12/30/15 12:27 PM   Result Value Ref Range    Glucose 98 60 - 99 mg/dL    Sodium 140 133 - 145 mmol/L    Potassium 4.5 3.3 - 5.1 mmol/L    Chloride 100 96 - 108 mmol/L    CO2 25 20 - 28 mmol/L    Anion Gap 15 7 - 16    UN 53 (H) 6 - 20 mg/dL    Creatinine 2.89 (H) 0.51 - 0.95 mg/dL    GFR,Caucasian 18 (!) *    GFR,Black 21 (!) *    Calcium 9.9 8.6 - 10.2 mg/dL   RUQ panel    Collection Time: 12/30/15 12:27 PM   Result Value Ref Range    Amylase  52 28 - 100 U/L    Lipase 32 13 - 60 U/L    Total Protein 8.3 (H) 6.3 - 7.7 g/dL    Albumin 4.5 3.5 - 5.2 g/dL    Globulin 3.8 2.7 - 4.3 g/dL    Bilirubin,Total 0.2 0.0 - 1.2 mg/dL    Bilirubin,Direct <0.2 0.0 - 0.3 mg/dL    Alk Phos 99 35 - 105 U/L    AST 24 0 - 35 U/L    ALT 26 0 - 35 U/L   TSH    Collection Time: 12/30/15 12:27 PM   Result Value Ref Range    TSH 5.44 (H) 0.27 - 4.20 uIU/mL   Troponin T    Collection Time: 12/30/15 12:27 PM   Result Value Ref Range    Troponin T 0.04 (H) 0.00 - 0.02 ng/mL   CK isoenzymes    Collection Time: 12/30/15 12:27 PM   Result Value Ref Range    CK 469 (H) 34 - 145 U/L    Mass CKMB 6.1 (H) 0.0 - 5.3 ng/mL    Relative Index 1.3 0.0 - 5.0 %   Beta hydroxybuterate    Collection Time: 12/30/15 12:27 PM   Result Value Ref Range    Beta Hydroxybuterate 0.06 0.02 - 0.27 mmol/L   Venous blood gas    Collection Time: 12/30/15 12:27 PM   Result Value Ref Range    Hemoglobin 14.0 11.2 - 15.7 g/dL    PH,VENOUS 7.37 7.32 - 7.42    PCO2,VENOUS 50 40 - 50 mm Hg    PO2,VENOUS 34 25 - 43 mm Hg    Bicarbonate,VENOUS 28 22 - 30 mmol/L    Base Excess,VENOUS 2     CO2 (Calc),VENOUS 30 22 - 31 mmol/L    FO2 HB,VENOUS 54 (L) 63 - 83 %    CO 0.8 0.0 - 1.4 %    Methemoglobin 0.3 0.0 - 1.0 %        Imaging:NA    Medical Decision Making        Assessment:    50 y.o., female placed in OBS after evaluation in the ED for two syncopal episodes. Elevated trop and TSH found in ED    Differential Diagnosis includes ACS, dehydration, hypoglycemia, thyroid abnormality                    Plan:   1.syncope  Tele  Serial trops  Lipid panel in AM  Cards consult  IVF  Orthostatics      F:NS @  125 ml/hr  E:no changes  N:diabetic diet, NPO at midnight      Medically preferred DVT prophylaxis: None  Smoking Cessation: NA    Code:Full    Disposition: expect discharge tomorrow     Jacqulyn Bath, [place]    Supervising physician Laural Benes, MD was immediately available       Jacqulyn Bath,  [place]  12/30/15 1630

## 2015-12-30 NOTE — ED Notes (Signed)
ED RN INTERN ATTESTATION       Anna Yancey FlemingsErika Sherlyne Crownover, RN (RN) reviewed the following charting information by the RN intern: Anna Ford    Nursing Assessments  Medications  Plan of Care  Teaching   Notes    In the chart of Anna Ford (50 y.o. female) and attest to the charting being accurate.

## 2015-12-30 NOTE — ED Notes (Signed)
12/30/15 1445   Observation Care   Observation care initiated  Yes   Patient has been verbally notified of their observation status Yes

## 2015-12-30 NOTE — Progress Notes (Signed)
12/30/15 2200   [facility] Patient Class Review   Patient Class Review Observation

## 2015-12-30 NOTE — Provider Consult (Signed)
Cardiology Consult Note    Date of Consult: 12/30/2015    Name: Anna Ford Attending: Kirkland Hun, MD   DOB: 14-Dec-1965 PCP: Windy Canny, MD,PHD   MRN: [id number] Primary Cardiologist: Katrinka Blazing, MD [facility] Cardiology Fellow)   CSN: [id number] Admit Date:  12/30/2015     History of Present Illness     I had the pleasure of seeing Anna Ford in cardiology consult on 12/30/2015. [name] is an 50 y.o.AA female nonsmoker with a past medical history significant  for hypertension, autonomic dysreflexia, Type 1 diabetes mellitus , lower extremity BKA, CKD III-IV, obesity, thyroid disease, OSA with CPAP, depression and remote supraventricular tachycardia with RF ablation in 2002. Samyuktha presents to [facility] emergency department after two witnessed syncopal episodes. She noted her blood sugars were significantly elevated into the 400-500 overnight with multiple pump adjustments needed for her SQ insulin. While getting a glass of water at the kitchen sink Elisa noted a ringing in her ears and feeling disconnected from the conversation she was having with her aunt. She awoke a few seconds later slumped down onto the floor near the sink. Later that morning while attempting to go up a few steps she again had the same sensation of ear ringing, generalized weakness and by her aunts account had slid backwards down the stairs without overt physical injury. While the second episode of syncope last <91min her aunt did note that Anna Ford's lower lip seemed to quiver and she had a "blank stare" for just a few seconds. No evidence of grand mal seizure, diaphoresis, pre/post chest pain/discomfort/dyspnea, loss of bowel/bladder function, N/V, recent illness/infection and /or palpations. EMS noted a blood sugar in the 140s on arrival.     Past Medical and Surgical History     Past Medical History:   Diagnosis Date    Autonomic dysreflexia     CKD (chronic kidney disease) stage 4, GFR 15-29 ml/min     Depression     DM  type 1 (diabetes mellitus, type 1)     HLD (hyperlipidemia)     Hypertension     OSA on CPAP     Osteomyelitis     SVT (supraventricular tachycardia)     s/p ablation in 2002    Thyroid disease     hypothyroid     Past Surgical History:   Procedure Laterality Date    cataracts Bilateral     left foot amputation Left     broke foot, never healed    LEG AMPUTATION BELOW KNEE Right     THYROIDECTOMY         Medications and Allergies     (Not in a hospital admission)  No current facility-administered medications for this encounter.    She is allergic to travatan z [travoprost].  No current facility-administered medications on file prior to encounter.      Current Outpatient Prescriptions on File Prior to Encounter   Medication Sig Dispense Refill    levothyroxine (SYNTHROID, LEVOTHROID) 125 MCG tablet Take 2 tablets (250 mcg total) by mouth daily (before breakfast) 60 tablet 5    felodipine (PLENDIL) 2.5 MG 24 hr tablet Take 1 tablet (2.5 mg total) by mouth daily   Swallow whole. Do not crush, break, or chew. 90 tablet 1    other supply Dispense CPAP supplies and accessories 1 each 0    lisinopril (PRINIVIL,ZESTRIL) 5 MG tablet Take 1 tablet (5 mg total) by mouth daily 30 tablet 3    furosemide (LASIX)  40 MG tablet Take 1 tablet (40 mg total) by mouth 2 times daily 60 tablet 2    Insulin Lispro (HUMALOG SC) Inject into the skin      carvedilol (COREG) 6.25 MG tablet Take 12.5 mg by mouth 2 times daily (with meals)         rosuvastatin (CRESTOR) 40 MG tablet Take 40 mg by mouth daily (with dinner)      escitalopram (LEXAPRO) 20 MG tablet Take 20 mg by mouth daily      Cholecalciferol (VITAMIN D-3 PO) Take by mouth      aspirin 81 MG tablet Take 81 mg by mouth daily      cetirizine (ZYRTEC) 10 MG tablet Take 10 mg by mouth daily      fluticasone (FLONASE) 50 MCG/ACT nasal spray 1 spray by Nasal route daily      montelukast (SINGULAIR) 10 MG tablet Take 10 mg by mouth nightly         Social and  Family History     History reviewed. No pertinent family history.  Social History     Social History    Marital status: Single     Spouse name: N/A    Number of children: N/A    Years of education: N/A     Occupational History    Not on file.     Social History Main Topics    Smoking status: Never Smoker    Smokeless tobacco: Never Used    Alcohol use Yes      Comment: socially    Drug use: No    Sexual activity: Not on file     Social History Narrative       Review of Systems     Review of Systems   Constitutional: Negative.    HENT: Negative.    Eyes: Negative.    Respiratory: Negative.    Cardiovascular: Negative.    Gastrointestinal: Negative.    Genitourinary: Positive for frequency.   Musculoskeletal: Positive for joint pain.   Skin: Negative.    Neurological: Positive for dizziness.   Endo/Heme/Allergies: Positive for polydipsia.   Psychiatric/Behavioral: Positive for depression.      Vitals and Physical Exam     Kerah's  height is 1.93 m (6\' 4" ) and weight is 149.7 kg (330 lb) (abnormal). Her oral temperature is 36.2 C (97.2 F). Her blood pressure is 140/84 and her pulse is 80. Her respiration is 18 and oxygen saturation is 99%.  Body mass index is 40.17 kg/(m^2).     Physical Exam   PHYSICAL EXAM:  CONSTITUTIONAL: Pleasant and interactive  NEURO: Alert and oriented in no acute distress  HENT: MMM, oropharynx clear, anicteric sclerae  NECK VASCULAR: JVP is difficult to assess secondary to body habitus.  There are no carotid bruits appreciated bilaterally.  CARDIOVASCULAR: A comprehensive cardiovascular exam was performed with details as follows:  Regular rhythm, distant S1-S2, no S3 or S4, there are no murmurs or rubs appreciated. PMI is normal in position in character.  2+ radial pulses without  lower extremity edema noted.  PULM: Lungs are clear to auscultation bilaterally without wheezes, rails, or rhonchi.  GI: Soft, central obesity,nontender, normal active bowel sounds, no abdominal bruits  noted.  MSK/EXTREMITIES: No clubbing, cyanosis, and edema as noted above.    Laboratory Data     Hematology:   Results in Past 365 Days  Result Component Current Result Previous Result   WBC 6.8 (12/30/2015) 7.2 (10/29/2015)   Hemoglobin  14.0 (12/30/2015) 10.1 (L) (10/29/2015)    12.1 (12/30/2015)    Hematocrit 39 (12/30/2015) 33 (L) (10/29/2015)   Platelets 345 (12/30/2015) 274 (10/29/2015)     Chemistry:   Results in Past 365 Days  Result Component Current Result Previous Result   Sodium 140 (12/30/2015) 139 (11/05/2015)   Potassium 4.5 (12/30/2015) 4.9 (11/05/2015)   Creatinine 2.89 (H) (12/30/2015) 2.70 (H) (11/05/2015)   Glucose 98 (12/30/2015) 57 (L) (11/05/2015)   Calcium 9.9 (12/30/2015) 9.2 (11/05/2015)   AST 24 (12/30/2015) Not in Time Range   ALT 26 (12/30/2015) Not in Time Range   TSH 5.44 (H) (12/30/2015) 0.46 (11/05/2015)     Coagulation Studies:   No results found for requested labs within last 365 days.     Cardiac:   Results in Past 365 Days  Result Component Current Result Previous Result   Troponin T 0.04 (H) (12/30/2015) 0.01 (10/29/2015)   CK 469 (H) (12/30/2015) Not in Time Range     Lipids:   No results found for requested labs within last 365 days.     Cardiac/Imaging Data     ECG: NSR  Telemetry: NSR with Q waves II,II,AVF and poor R wave progression       Impression and Plan     Principal Problem:    Syncope and collapse  Active Problems:    HTN (hypertension)    Hypothyroidism    Type 1 diabetes mellitus    SVT (supraventricular tachycardia)    OSA on CPAP    CKD (chronic kidney disease), stage IV    Autonomic dysreflexia    Morbid obesity    Assessment:This is an 50 y.o. AA female  nonsmoker with a past medical history significant  for hypertension, autonomic dysreflexia, Type 1 diabetes mellitus , lower extremity BKA, CKD III-IV, obesity, thyroid disease, OSA with CPAP, depression and remote supraventricular tachycardia with RF ablation in 2002. Presents with two episodes of witnessed syncope with  prodrome.      Recommendations:     Syncope and collapse: Etiology of syncope is not entirely clear however she has multiple risk factors for syncope including uncontrolled diabetes with both hypoglycemia and hyperglycemia, autonomic dysreflexia, history of SVT though is status post ablation, hypertension on antihypertensives.  - For now would continue telemetry to monitor for arrhythmias.  - Please obtain TTE in the morning to evaluate cardiac structure.  -Please obtain orthostatic blood pressures.     Troponemia: Very mild in the setting of chronic kidney disease.  She has no symptoms of chest pain or shortness of breath and no acute EKG changes.  - Can repeat troponin x 1 and stop checking if remains stable.  - Would obtain TTE as noted above.      Hypertension: Well-controlled.  - Continue home felodipine 2.5 mg daily and lisinopril 5 mg daily.    Diabetes: Agree with statin and ACE inhibitor in the setting of diabetes.    Emyah Roznowski Murrell ReddenLYNN Zaivion Kundrat, MD PhD 4:47 PM 12/30/2015

## 2015-12-30 NOTE — ED Notes (Signed)
Pt reports checking blood sugar before going to bed last night states it was "308", states waking up in the middle of the night "because my face felt funny" checked blood sugar again and it "was to high to read" she took insulin to correct it. This morning pt states that that her "blood sugar read 409 around 7", reports around 8 am she felt her dizzy and her face "felt funny again" and "next thing I knew I was on the ground" , reports her aunt tried walking her up the stairs and "past out again", states she does not remember it, denies hitting head, denies sob and pain , dizziness at this time. C/O nausea at this time

## 2015-12-30 NOTE — ED Provider Notes (Addendum)
History     Chief Complaint   Patient presents with    Syncope     Passed out twice this morning.  Does not recall events leading up to either episode.  Only current complaint is nausea.  EMS reports BG 194.     HPI Comments: Anna Ford is a 50yo female with a PMH of; depression, IDDM, HTN, osteomyelitis, BKA, CKD, and thyroid disease. Pt presents today after two episodes this morning.  Patient notes history of dystonia.  She denies previous syncopal episodes in the past. She notes over the weekend her blood sugars have been elevated. She notes her monitor read "high" last night and during the night. Over the weekend she attempted to move sites, without great relief in high blood sugars. She does admit to eating pasta last night for dinner. She notes during the night awakening to lip tingling, which is when she knows her blood sugars are very high or very low. She read sugar, it was "High", she bolused on her pump and went back to sleep. She again awoke and bolused again for blood surgar repeat in the low 400's. She notes this morning she passed out twice; first time in bathroom, and the second time in her bedroom (she awoke on floor with no recollection of event). She notes no precursor symptoms prior to syncope. She has had dizziness and near- syncope in the past, but has never truly syncopsized.       History provided by:  Patient  Language interpreter used: No    Is this ED visit related to civilian activity for income:  Not work related    Past Medical History:   Diagnosis Date    Depression     Diabetes mellitus     Hypertension     Osteomyelitis     Thyroid disease     hypothyroid        Past Surgical History:   Procedure Laterality Date    cataracts Bilateral     left foot amputation Left     broke foot, never healed    LEG AMPUTATION BELOW KNEE Right     THYROIDECTOMY       History reviewed. No pertinent family history.    Social History    reports that she has never smoked. She has never  used smokeless tobacco. She reports that she drinks alcohol. She reports that she does not use illicit drugs. Her sexual activity history is not on file.    Living Situation     Questions Responses    Patient lives with     Comment: staying with her aunt     Homeless     Caregiver for other family member     External Services     Employment Employed    Comment: Glass blower/designer at [employer]           Problem List     Patient Active Problem List   Diagnosis Code    HTN (hypertension) I10    Hypothyroidism E03.9    Type I diabetes mellitus E10.9    Chronic kidney disease (CKD), stage III (moderate) N18.3    SVT (supraventricular tachycardia) I47.1    S/P BKA (below knee amputation), right Z89.511    OSA on CPAP G47.33, Z99.89       Review of Systems   Review of Systems   Constitutional: Negative.  Negative for chills and fever.   HENT: Negative.  Negative for congestion and rhinorrhea.    Eyes: Negative.  Negative for redness.   Respiratory: Negative.  Negative for shortness of breath.    Cardiovascular: Negative for chest pain, palpitations and leg swelling.   Gastrointestinal: Negative.  Negative for nausea and vomiting.   Endocrine: Negative.  Negative for polyuria.   Genitourinary: Negative.  Negative for dysuria.   Musculoskeletal: Negative.  Negative for back pain and gait problem.   Skin: Negative.  Negative for color change, pallor, rash and wound.   Allergic/Immunologic: Negative.  Negative for immunocompromised state.   Neurological: Positive for dizziness, syncope and light-headedness. Negative for seizures.   Hematological: Negative.    Psychiatric/Behavioral: Negative.  Negative for hallucinations.       Physical Exam     ED Triage Vitals   BP Heart Rate Heart Rate (via Pulse Ox) Resp Temp Temp src SpO2 O2 Device O2 Flow Rate   12/30/15 1044 12/30/15 1044 -- 12/30/15 1044 12/30/15 1044 12/30/15 1044 12/30/15 1044 12/30/15 1142 --   140/84 80  18 36.2 C (97.2 F) Oral 99 %  None (Room air)       Weight           12/30/15 1044           149.7 kg (330 lb)                    Physical Exam   Constitutional: She is oriented to person, place, and time. She appears well-developed and well-nourished.  Non-toxic appearance. She does not have a sickly appearance. She does not appear ill. No distress.   Conversant, well appearing, no chest pain/shortness of breath/vomiting/abd pain.   HENT:   Head: Normocephalic and atraumatic.   Eyes: Conjunctivae and EOM are normal. Pupils are equal, round, and reactive to light. Right eye exhibits no nystagmus. Left eye exhibits no nystagmus.   Neck: Normal range of motion.   Cardiovascular: Normal rate, regular rhythm, normal heart sounds and intact distal pulses.  Exam reveals no gallop.    No murmur heard.  Pulmonary/Chest: Effort normal and breath sounds normal. No stridor.   Abdominal: Soft. Normal appearance and bowel sounds are normal. She exhibits no distension.   Musculoskeletal: Normal range of motion. She exhibits no edema.   Left sided BKA/Foot amputee    Neurological: She is alert and oriented to person, place, and time.   Skin: Skin is warm, dry and intact. She is not diaphoretic.   Psychiatric: She has a normal mood and affect. Her behavior is normal. Judgment and thought content normal.   Nursing note and vitals reviewed.      Medical Decision Making        Initial Evaluation:  ED First Provider Contact     Date/Time Event User Comments    12/30/15 1102 ED First Provider Contact DIERKS, MEGAN Initial Face to Face Provider Contact          Patient seen by me today 12/30/2015 at 1105    Assessment:  50 y.o.female comes to the ED with syncope     Differential Diagnosis includes   1.) Syncope   2.) ACS  3.) Cardiac dysrhythmia   4.) Chronic dizziness   5.) Hypoglycemia                    Plan:   1.) Labs; cbc, bmp, trop, ck iso, beta  2.) Imaging; Tele, EKG  3.) Medications; IVF  4.) Dispo; Will necessitate  admission for further  evaluation/monitioring. Pt agreeable.     Findings;  Recent Results (from the past 24 hour(s))   POCT glucose    Collection Time: 12/30/15 11:27 AM   Result Value Ref Range    Glucose POCT 145 (H) 60 - 99 mg/dL   CBC and differential    Collection Time: 12/30/15 12:27 PM   Result Value Ref Range    WBC 6.8 4.0 - 10.0 THOU/uL    RBC 5.5 (H) 3.9 - 5.2 MIL/uL    Hemoglobin 12.1 11.2 - 15.7 g/dL    Hematocrit 39 34 - 45 %    MCV 71 (L) 79 - 95 fL    MCH 22 (L) 26 - 32 pg    MCHC 31 (L) 32 - 36 g/dL    RDW 14.0 11.7 - 14.4 %    Platelets 345 160 - 370 THOU/uL    Seg Neut % 64.0 %    Lymphocyte % 27.1 %    Monocyte % 8.4 %    Eosinophil % 0.0 %    Basophil % 0.1 %    Neut # K/uL 4.3 1.6 - 6.1 THOU/uL    Lymph # K/uL 1.8 1.2 - 3.7 THOU/uL    Mono # K/uL 0.6 0.2 - 0.9 THOU/uL    Eos # K/uL 0.0 0.0 - 0.4 THOU/uL    Baso # K/uL 0.0 0.0 - 0.1 THOU/uL    Nucl RBC % 0.0 0.0 - 0.2 /100 WBC    Nucl RBC # K/uL 0.0 0.0 - 0.0 THOU/uL    IMM Granulocytes # 0.0 0.0 - 0.1 THOU/uL    IMM Granulocytes 0.4 %   Basic metabolic panel    Collection Time: 12/30/15 12:27 PM   Result Value Ref Range    Glucose 98 60 - 99 mg/dL    Sodium 140 133 - 145 mmol/L    Potassium 4.5 3.3 - 5.1 mmol/L    Chloride 100 96 - 108 mmol/L    CO2 25 20 - 28 mmol/L    Anion Gap 15 7 - 16    UN 53 (H) 6 - 20 mg/dL    Creatinine 2.89 (H) 0.51 - 0.95 mg/dL    GFR,Caucasian 18 (!) *    GFR,Black 21 (!) *    Calcium 9.9 8.6 - 10.2 mg/dL   RUQ panel    Collection Time: 12/30/15 12:27 PM   Result Value Ref Range    Amylase 52 28 - 100 U/L    Lipase 32 13 - 60 U/L    Total Protein 8.3 (H) 6.3 - 7.7 g/dL    Albumin 4.5 3.5 - 5.2 g/dL    Globulin 3.8 2.7 - 4.3 g/dL    Bilirubin,Total 0.2 0.0 - 1.2 mg/dL    Bilirubin,Direct <0.2 0.0 - 0.3 mg/dL    Alk Phos 99 35 - 105 U/L    AST 24 0 - 35 U/L    ALT 26 0 - 35 U/L   TSH    Collection Time: 12/30/15 12:27 PM   Result Value Ref Range    TSH 5.44 (H) 0.27 - 4.20 uIU/mL   Troponin T    Collection Time: 12/30/15 12:27 PM   Result  Value Ref Range    Troponin T 0.04 (H) 0.00 - 0.02 ng/mL   CK isoenzymes    Collection Time: 12/30/15 12:27 PM   Result Value Ref Range    CK 469 (H) 34 - 145 U/L  Mass CKMB 6.1 (H) 0.0 - 5.3 ng/mL    Relative Index 1.3 0.0 - 5.0 %   Beta hydroxybuterate    Collection Time: 12/30/15 12:27 PM   Result Value Ref Range    Beta Hydroxybuterate 0.06 0.02 - 0.27 mmol/L   Venous blood gas    Collection Time: 12/30/15 12:27 PM   Result Value Ref Range    Hemoglobin 14.0 11.2 - 15.7 g/dL    PH,VENOUS 7.37 7.32 - 7.42    PCO2,VENOUS 50 40 - 50 mm Hg    PO2,VENOUS 34 25 - 43 mm Hg    Bicarbonate,VENOUS 28 22 - 30 mmol/L    Base Excess,VENOUS 2     CO2 (Calc),VENOUS 30 22 - 31 mmol/L    FO2 HB,VENOUS 54 (L) 63 - 83 %    CO 0.8 0.0 - 1.4 %    Methemoglobin 0.3 0.0 - 1.0 %       Pt with elevated troponin; chest pain free and without any ST segment changes on EKG. Likely due to setting of CKD. Will continue to trend. Pt to be brought in for obs for further evaluation and management. Pt is agreeable with plan of care.         Jerl Mina, NP    Collaborating physician Dr. Carolyn Stare was immediately available       Jerl Mina, NP  12/30/15 1436      APP Review:     I had face-to-face interaction with the patient on arrival date of 12/30/2015 at 1420.      I was asked by APP to see this patient due to diagnostic uncertainty.    I have reviewed and agree with the above documentation and, in addition, the  History notable 50 year old female presenting after syncopal episode. On exam patient is well appearing, clear lungs, heart regular rate rhythm. Plan is admission.           Author Sherin Quarry, MD       Sherin Quarry, MD  01/03/16 [phone]

## 2015-12-30 NOTE — Progress Notes (Signed)
AVSS. Pt A+OX3. Pt has Right leg prosthesis and Left leg AFO. Insulin pump used at home, see orders here. Q6 Trops and Urinalysis ordered. See MAR and doc flowsheet. Report given to Aram Beechamynthia, Charity fundraiserN. Domenick GongSherry Vahe Pienta, RN

## 2015-12-30 NOTE — Telephone Encounter (Signed)
Received call from patient regarding having episodes of "high" readings this weekend with her glucose pump.  Patient states her diet this weekend was not any different than previous days.  Patient states she did change her pump set on Saturday, and perhaps it was in area with scar tissue.  Patient changed the set again last night, and this morning her reading was 409.  Patient states to day she wasn't feeling well, felt dizzy and did not go to work.  Patient wondering if she should go to ED.  Patient changed her appointment with Dr. Lennette BihariKohn from 01/02/16 to 12/31/15.  Patient does not have an appointment with Endocrinologist until early December.

## 2015-12-31 ENCOUNTER — Ambulatory Visit: Payer: PRIVATE HEALTH INSURANCE | Admitting: Internal Medicine

## 2015-12-31 ENCOUNTER — Observation Stay: Payer: PRIVATE HEALTH INSURANCE

## 2015-12-31 ENCOUNTER — Telehealth: Payer: Self-pay

## 2015-12-31 LAB — ECHO COMPLETE
Aortic Arch Diameter: 2.4 cm
Aortic Diameter (mid tubular): 3.2 cm
Aortic Diameter (sinus of Valsalva): 3.2 cm
BMI: 40.3 kg/m2
BP Diastolic: 57 mmHg
BP Systolic: 175 mmHg
BSA: 2.83 m2
Deceleration Time - MV: 257 ms
E/A ratio: 0.88
Heart Rate: 69 {beats}/min
Height: 76 in
LA Diameter BSA Index: 1.2 cm/m2
LA Diameter Height Index: 1.8 cm/m
LA Diameter: 3.4 cm
LA Systolic Vol BSA Index: 24.6 mL/m2
LA Systolic Vol Height Index: 36.1 mL/m
LA Systolic Volume: 69.7 mL
LV ASE Mass BSA Index: 68.6 gm/m2
LV ASE Mass Height 2.7 Index: 32.9 gm/m2.7
LV ASE Mass Height Index: 100.6 gm/m
LV ASE Mass: 194.1 gm
LV Posterior Wall Thickness: 1.4 cm
LV Septal Thickness: 1.4 cm
LVED Diameter BSA Index: 1.3 cm/m2
LVED Diameter Height Index: 2 cm/m
LVED Diameter: 3.8 cm
LVES Diameter BSA Index: 0.9 cm/m2
LVES Diameter Height Index: 1.3 cm/m
LVES Diameter: 2.5 cm
LVOT Area (calculated): 3.94 cm2
LVOT Cardiac Index: 2.33 L/min/m2
LVOT Cardiac Output: 6.6 L/min
LVOT Diameter: 2.24 cm
LVOT PWD VTI: 24.3 cm
LVOT PWD Velocity (mean): 71 cm/s
LVOT PWD Velocity (peak): 98.1 cm/s
LVOT SV BSA Index: 33.82 mL/m2
LVOT SV Height Index: 49.6 mL/m
LVOT Stroke Rate (mean): 279.7 mL/s
LVOT Stroke Rate (peak): 386.4 mL/s
LVOT Stroke Volume: 95.71 cc
MV Peak A Velocity: 85.6 cm/s
MV Peak E Velocity: 75 cm/s
Mitral Annular E/Ea Vel Ratio: 15.56
Mitral Annular Ea Velocity: 4.82 cm/s
Peak Gradient - TR: 19.1 mmHg
Peak Velocity - TR: 218.77 cm/s
RA Diameter (Medial-Lateral) BSA Index: 1.3 cm/m2
RA Diameter (Medial-Lateral) Height Index: 1.9 cm/m
RA Diameter (Medial-Lateral): 3.7 cm
RA Pressure Estimate: 5 mmHg
RR Interval: 869.57 ms
RV Peak Systolic Pressure: 24.1 mmHg
RVED Diameter BSA Index: 1.3 cm/m2
RVED Diameter Height Index: 1.8 cm/m
RVED Diameter: 3.54 cm
Weight (lbs): 330 [lb_av]
Weight: 5280 oz

## 2015-12-31 LAB — CBC AND DIFFERENTIAL
Baso # K/uL: 0 10*3/uL (ref 0.0–0.1)
Basophil %: 0 %
Eos # K/uL: 0 10*3/uL (ref 0.0–0.4)
Eosinophil %: 0 %
Hematocrit: 34 % (ref 34–45)
Hemoglobin: 10.6 g/dL — ABNORMAL LOW (ref 11.2–15.7)
IMM Granulocytes #: 0 10*3/uL (ref 0.0–0.1)
IMM Granulocytes: 0.3 %
Lymph # K/uL: 3.3 10*3/uL (ref 1.2–3.7)
Lymphocyte %: 42.9 %
MCH: 22 pg — ABNORMAL LOW (ref 26–32)
MCHC: 31 g/dL — ABNORMAL LOW (ref 32–36)
MCV: 71 fL — ABNORMAL LOW (ref 79–95)
Mono # K/uL: 0.5 10*3/uL (ref 0.2–0.9)
Monocyte %: 6.6 %
Neut # K/uL: 3.8 10*3/uL (ref 1.6–6.1)
Nucl RBC # K/uL: 0 10*3/uL (ref 0.0–0.0)
Nucl RBC %: 0 /100 WBC (ref 0.0–0.2)
Platelets: 321 10*3/uL (ref 160–370)
RBC: 4.8 MIL/uL (ref 3.9–5.2)
RDW: 14.3 % (ref 11.7–14.4)
Seg Neut %: 50.2 %
WBC: 7.6 10*3/uL (ref 4.0–10.0)

## 2015-12-31 LAB — HOLD GREEN WITH GEL

## 2015-12-31 LAB — POCT GLUCOSE
Glucose POCT: 105 mg/dL — ABNORMAL HIGH (ref 60–99)
Glucose POCT: 124 mg/dL — ABNORMAL HIGH (ref 60–99)

## 2015-12-31 LAB — BASIC METABOLIC PANEL
Anion Gap: 13 (ref 7–16)
CO2: 24 mmol/L (ref 20–28)
Calcium: 9 mg/dL (ref 8.6–10.2)
Chloride: 102 mmol/L (ref 96–108)
Creatinine: 2.82 mg/dL — ABNORMAL HIGH (ref 0.51–0.95)
GFR,Black: 21 * — AB
GFR,Caucasian: 18 * — AB
Glucose: 68 mg/dL (ref 60–99)
Lab: 52 mg/dL — ABNORMAL HIGH (ref 6–20)
Potassium: 4.1 mmol/L (ref 3.3–5.1)
Sodium: 139 mmol/L (ref 133–145)

## 2015-12-31 LAB — LIPID PANEL
Chol/HDL Ratio: 3 (ref 0.0–4.4)
Cholesterol: 147 mg/dL
HDL: 49 mg/dL
LDL Calculated: 70 mg/dL
Non HDL Cholesterol: 98 mg/dL
Triglycerides: 140 mg/dL

## 2015-12-31 LAB — HOLD BLUE

## 2015-12-31 MED ORDER — PERFLUTREN PROTEIN A MICROSPH (OPTISON) IV SUSP *I*
1.5000 mL | INTRAVENOUS | Status: DC | PRN
Start: 2015-12-31 — End: 2015-12-31
  Administered 2015-12-31: 1.5 mL via INTRAVENOUS

## 2015-12-31 NOTE — ED Obs Notes (Signed)
MPA Progress Note:  S) Pt without complaints today. Orthostatics negative. Pt is eager for discharge. Understands that she is not allowed to drive until a causative etiology is identified or until she is cleared by cardiology.     O)   BP 130/59 (BP Location: Right arm)   Pulse 83   Temp 36.2 C (97.2 F) (Temporal)    Resp 20   Ht 1.93 m ('6\' 4"'$ )   Wt (!) 149.7 kg (330 lb)   SpO2 91%   BMI 40.17 kg/m2  Obese AA female, resting in bed in NAD, smiling, pleasant  HRR, no M/R/G, no chest wall tenderness, no JVD  LS CTA without W/R/R anteriorly, no respiratory distress  S/p right BKA, no BLE cord/calf tenderness  Alert, conversant, no focal deficits    Labs/Diagnostics - Reviewed. Echocardiogram with structurally normal heart and evidence of LVH  Telemetry - Reviewed, sinus rhythm in low-70's    A/P)  Syncope - Appreciate cardiology's recommendations   - 30 day Holter monitor   - No driving as above   - Continue CCB, ACE-I, and BB   - F/u with cardiology as scheduled in 6 weeks     Discussed with pt and with Dr. Richardson Landry, who are both in agreement with plan of care as outlined above.   Kinnie Scales, PA       Howie Ill [place], [place]  12/31/15 [phone]

## 2015-12-31 NOTE — Progress Notes (Signed)
Cardiology Progress Note    Date of Consult: 12/31/2015    Name: Anna Ford Attending: Kirkland Hun, MD   DOB: 1965/09/18 PCP: Windy Canny, MD,PHD   MRN: [id number] Primary Cardiologist:Emily Seif, MD [facility]iology Fellow)   CSN: [id number] Admit Date:  12/30/2015     Subjective     I had the pleasure of seeing Anna Ford in cardiology followup on 12/31/2015. No further syncope and/or near syncope overnight. Orthostatic pressures obtained without appreciated abnormalities.    Past Medical History:   Diagnosis Date    Autonomic dysreflexia     CKD (chronic kidney disease) stage 4, GFR 15-29 ml/min     Depression     DM type 1 (diabetes mellitus, type 1)     HLD (hyperlipidemia)     Hypertension     Morbid obesity with BMI of 40.0-44.9, adult     OSA on CPAP     Osteomyelitis     SVT (supraventricular tachycardia)     s/p ablation in 2002    Thyroid disease     hypothyroid     Past Surgical History:   Procedure Laterality Date    cataracts Bilateral     left foot amputation Left     broke foot, never healed    LEG AMPUTATION BELOW KNEE Right     THYROIDECTOMY         Medications     Current Facility-Administered Medications   Medication Dose Route Frequency    insulin lispro   Subcutaneous TID AC    aspirin  81 mg Oral Daily    carvedilol  12.5 mg Oral BID WC    cetirizine  5 mg Oral Nightly    escitalopram  20 mg Oral Daily    felodipine  2.5 mg Oral Daily    furosemide  40 mg Oral BID    levothyroxine  250 mcg Oral QAM    lisinopril  5 mg Oral Daily       Vitals and Physical Exam     [name]'s  height is 1.93 m (6\' 4" ) and weight is 149.7 kg (330 lb) (abnormal). Her temporal temperature is 36.4 C (97.5 F). Her blood pressure is 142/74 and her pulse is 74. Her respiration is 20 and oxygen saturation is 98%.  Body mass index is 40.17 kg/(m^2).     Objective   PHYSICAL EXAM:  CONSTITUTIONAL: Pleasant and interactive  NEURO: Alert and oriented in no acute distress  HENT: MMM,  oropharynx clear, anicteric sclerae  NECK VASCULAR: JVP is difficult to assess secondary to body habitus.  There are no carotid bruits appreciated bilaterally.  CARDIOVASCULAR: A comprehensive cardiovascular exam was performed with details as follows:  Regular rhythm, distant S1-S2, no S3 or S4, there are no murmurs or rubs appreciated. PMI is normal in position in character.  2+ radial pulses without  lower extremity edema noted.  PULM: Lungs are clear to auscultation bilaterally without wheezes, rails, or rhonchi.  GI: Soft, central obesity,nontender, normal active bowel sounds, no abdominal bruits noted.  MSK/EXTREMITIES: No clubbing, cyanosis, and edema as noted above.  Laboratory Data     Hematology:   Results in Past 365 Days  Result Component Current Result Previous Result   WBC 7.6 (12/31/2015) 6.8 (12/30/2015)   Hemoglobin 10.6 (L) (12/31/2015) 14.0 (12/30/2015)     12.1 (12/30/2015)   Hematocrit 34 (12/31/2015) 39 (12/30/2015)   Platelets 321 (12/31/2015) 345 (12/30/2015)     Chemistry:   Results in Past  365 Days  Result Component Current Result Previous Result   Sodium 139 (12/31/2015) 140 (12/30/2015)   Potassium 4.1 (12/31/2015) 4.5 (12/30/2015)   Creatinine 2.82 (H) (12/31/2015) 2.89 (H) (12/30/2015)   Glucose 68 (12/31/2015) 98 (12/30/2015)   Calcium 9.0 (12/31/2015) 9.9 (12/30/2015)   AST 24 (12/30/2015) Not in Time Range   ALT 26 (12/30/2015) Not in Time Range   TSH 5.44 (H) (12/30/2015) 0.46 (11/05/2015)     Coagulation Studies:   No results found for requested labs within last 365 days.     Cardiac:   Results in Past 365 Days  Result Component Current Result Previous Result   Troponin T 0.03 (H) (12/30/2015) 0.04 (H) (12/30/2015)   CK 469 (H) (12/30/2015) Not in Time Range     Lipids:   Results in Past 365 Days  Result Component Current Result Previous Result   Cholesterol 147 (12/31/2015) Not in Time Range   HDL 49 (12/31/2015) Not in Time Range   Triglycerides 140 (12/31/2015) Not in Time Range      LDL Calculated 70 (12/31/2015) Not in Time Range       Cardiac/Imaging Data     Telemetry: NSR          Echo Complete 12/31/2015    Narrative  Moderately hypertrophied left ventricle with normal systolic function   and no segmental wall motion abnormalities. Estimated LVEF is 65-70%.   Left ventricular diastolic function is abnormal, grade 1 (mild).   The size of the right ventricular cavity is within normal limits. RV   ejection fraction is normal.   No significant valvular abnormalities.   The estimated RV systolic pressure is within normal limits (24.1 mmHg).   The patient did not have a prior exam to compare this exam's results to.          Impression and Plan     Principal Problem:    Syncope and collapse  Active Problems:    HTN (hypertension)    Hypothyroidism    Type 1 diabetes mellitus    SVT (supraventricular tachycardia)    OSA on CPAP    CKD (chronic kidney disease), stage IV    Autonomic dysreflexia    Morbid obesity    Assessment:This is an [age] y.o. AA female  nonsmoker with a past medical history significant  for hypertension, autonomic dysreflexia, Type 1 diabetes mellitus , lower extremity BKA, CKD III-IV, obesity, thyroid disease, OSA with CPAP, depression and remote supraventricular tachycardia with RF ablation in 2002. Presents with two episodes of witnessed syncope with prodrome.     Recommendations:     Syncope and collapse: Etiology of syncope is not entirely clear however she has multiple risk factors for syncope including uncontrolled diabetes with both hypoglycemia and hyperglycemia, autonomic dysreflexia, history of SVT though is status post ablation, hypertension on antihypertensives.  -echocardiogram with structurally normal heart and evidence of LVH.  -Normal orthostatic blood pressures  - Telemetry without electrical abnormalities. Will  obtain 30 day cardiac event monitor     Troponemia: Very mild in the setting of chronic kidney disease.  She has no symptoms of chest pain or  shortness of breath and no acute EKG changes.  -  troponin remains 0.03- no further checks required   -TTE as above . No indication for further ischemic evaluation  warranted at this time.     Hypertension: Well-controlled.  - Continue home felodipine 2.5 mg daily, lisinopril 5 mg daily and carvedilol 12.4163m gbID    Diabetes: Agree  with statin, ASA and ACE inhibitor in the setting of diabetes. Can consider addition of statin as an outpatient.     Cardiology outpatient follow-up arranged in 6 weeks.Stable for discharge from a cardiac perspective.     Tamara Kenyon Murrell ReddenLYNN Janitza Revuelta, MD PhD 10:48 AM 12/31/2015

## 2015-12-31 NOTE — Discharge Instructions (Signed)
No driving until a causative etiology for your syncope has been diagnosed, or until cardiology clear you.  Follow-up with cardiology in 6 weeks as scheduled, and follow-up with your primary care physician as needed.

## 2015-12-31 NOTE — ED Notes (Signed)
Ambulatory in EOU with cane and prosthetics, gait steady. Denies chest pain, SOB, lightheaded or other c/o. Ready for d/c home. D/c instructions discussed including cardiac monitor, concerning signs/sx and follow up care, voiced understanding. Aunt coming to pick her up.

## 2016-01-01 DIAGNOSIS — R55 Syncope and collapse: Secondary | ICD-10-CM

## 2016-01-01 HISTORY — DX: Syncope and collapse: R55

## 2016-01-01 LAB — AEROBIC CULTURE

## 2016-01-01 NOTE — Telephone Encounter (Signed)
Text exchange initiated by patient reporting that she is feeling better post ED visit, and is planning to call office to schedule follow up.

## 2016-01-02 ENCOUNTER — Ambulatory Visit: Payer: Self-pay | Admitting: Internal Medicine

## 2016-01-04 LAB — EKG 12-LEAD
P: 34 deg
PR: 184 ms
QRS: -12 deg
QRSD: 98 ms
QT: 412 ms
QTc: 467 ms
Rate: 77 {beats}/min
Severity: ABNORMAL
Statement: BORDERLINE
T: -19 deg

## 2016-01-09 ENCOUNTER — Telehealth: Payer: Self-pay | Admitting: Nephrology

## 2016-01-09 NOTE — Telephone Encounter (Signed)
Spoke to patient and scheduled a NPV with Dr. Fabio Bering on 11.10.17 @ 9:30am in the [facility] location.

## 2016-01-10 ENCOUNTER — Ambulatory Visit: Payer: Self-pay | Admitting: Nephrology

## 2016-01-10 ENCOUNTER — Encounter: Payer: Self-pay | Admitting: Nephrology

## 2016-01-10 VITALS — BP 155/85 | HR 78 | Ht 76.0 in | Wt 330.4 lb

## 2016-01-10 DIAGNOSIS — I1310 Hypertensive heart and chronic kidney disease without heart failure, with stage 1 through stage 4 chronic kidney disease, or unspecified chronic kidney disease: Secondary | ICD-10-CM

## 2016-01-10 DIAGNOSIS — R809 Proteinuria, unspecified: Secondary | ICD-10-CM

## 2016-01-10 DIAGNOSIS — N2581 Secondary hyperparathyroidism of renal origin: Secondary | ICD-10-CM

## 2016-01-10 DIAGNOSIS — N184 Chronic kidney disease, stage 4 (severe): Secondary | ICD-10-CM

## 2016-01-10 DIAGNOSIS — E1021 Type 1 diabetes mellitus with diabetic nephropathy: Secondary | ICD-10-CM

## 2016-01-10 DIAGNOSIS — D631 Anemia in chronic kidney disease: Secondary | ICD-10-CM

## 2016-01-10 MED ORDER — LISINOPRIL 10 MG PO TABS *I*
10.0000 mg | ORAL_TABLET | Freq: Every evening | ORAL | 5 refills | Status: DC
Start: 2016-01-10 — End: 2016-02-12

## 2016-01-10 NOTE — Progress Notes (Signed)
Primary Care MD:  Windy Cannyarroll, Thomas, MD,PHD     Reason for Consult:  Chronic Kidney Disease    History of Present Illness:  Anna Ford is a 50 y.o. Female referred to nephrology for evaluation and management of chronic kidney disease.  Anna Ford has previously followed with a total of two nephrologists for the past four years in [place].  [place] moved to [place] in August 2017.  Her records prior to her move are not immediately available.  Anna Ford has had type 1 DM since age 89.  Her course has been complicated by hypertension since 1994 and diabetic retinopathy for about 12 years.  She preorts having diabetic neuropathy for about the same length of time.  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  She has taken NSAIDs regularly for a time in the past prior to her left hip replacement 6-7 years ago, but not recently.  No history of stroke or heart disease, but did have an episode of syncope and is wearing a heart monitor for this problem now.  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  Her father was on dialysis starting at age approximately age 52 and died at age 52.  Her mother is living and has diabetes, but not ESRD.  No siblings with kidney problems.  She had taken NSAIDs on a regular basis before hip replacement 7 years ago. Anna Ford reports that her GFR has been falling for about 10 years.  Does not check BPs at home.  She does note lightheadedness on standing.  She can not recall having a renal ultrasound.  She is interested in transplant.       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    Syncope and collapse R55     Morbid obesity E66.01         Past Medical History:   Diagnosis Date    Autonomic dysreflexia     CKD (chronic kidney disease) stage 4, GFR 15-29 ml/min     Depression     DM type 1 (diabetes mellitus, type 1)     HLD (hyperlipidemia)     Hypertension     Morbid obesity with BMI of 40.0-44.9, adult     OSA on CPAP     Osteomyelitis     SVT (supraventricular tachycardia)     s/p ablation in 2002    Thyroid disease     hypothyroid          Allergies: Travatan z [travoprost]    Medications:   Current Outpatient Prescriptions   Medication    levothyroxine (SYNTHROID, LEVOTHROID) 125 MCG tablet    felodipine (PLENDIL) 2.5 MG 24 hr tablet    other supply    lisinopril (PRINIVIL,ZESTRIL) 5 MG tablet    furosemide (LASIX) 40 MG tablet    Insulin Lispro (HUMALOG SC)    carvedilol (COREG) 6.25 MG tablet    rosuvastatin (CRESTOR) 40 MG tablet    escitalopram (LEXAPRO) 20 MG tablet    Cholecalciferol (VITAMIN D-3 PO)    aspirin 81 MG  tablet    cetirizine (ZYRTEC) 10 MG tablet    fluticasone (FLONASE) 50 MCG/ACT nasal spray    montelukast (SINGULAIR) 10 MG tablet     No current facility-administered medications for this visit.          The Medications were reconciled in eRecord EMR today.    Social/Occupational:  Anna Ford  reports that she has never smoked. She has never used smokeless tobacco. She reports that she drinks alcohol. She reports that she does not use illicit drugs. Her sexual activity history is not on file..  Works as an Chief Financial Officer.    Family histories: There family history is positive for kidney disease.    Review of Systems: The remainder of a comprehensive point review of systems was negative except as noted above.      Physical Examination:    Constitutional: Well developed, well nourished, in no apparent distress, obese  BP 155/85   Pulse 78   Ht 1.93 m (6\' 4" )   Wt (!) 149.9 kg (330 lb 6.4 oz)   BMI 40.22 kg/m2  EYES:  Conjunctivae pink; no scleral icterus; pupils  symmetric; gaze is conjugate   ENT: External appearance of ears and nose are normal;  Mucus membranes are moist; Oral Pharynx clear;  RESPIRATORY: Normal respiratory effort and diaphragmatic excursion; Clear to ausculation  CARDIAC: JVP not distended; heart is regular without G/R/M; extremities are perfused;  No carotid or abdonimal bruits; no peripheral edema  GASTROINTESTINAL: The abdomen is soft, non-tender, non-distended, with no palpable masses; No costo-vertebral tenderness  MUSCULOSKELETAL:  Normal Gait; No clubbing or cyanosis; No hot, tender, or red joints; prosthetics in place  SKIN:  No rashes or ulcers on visible skin  Neuro: Alert, lucid, and oriented x 3; Affect is appropriate; Memory appears to be intact    Data:        Component Value Date/Time    NA 139 12/31/2015 0521    K 4.1 12/31/2015 0521    CL 102 12/31/2015 0521    CO2 24 12/31/2015 0521    UN 52 (H) 12/31/2015 0521    CREAT 2.82 (H) 12/31/2015 0521    GFRC 18 (!) 12/31/2015 0521    GFRB 21 (!) 12/31/2015 0521    GLU 68 12/31/2015 0521    HA1C 10.6 (H) 11/05/2015 1445    CA 9.0 12/31/2015 0521    UCRR 87 10/29/2015 1646          Component Value Date/Time    WBC 7.6 12/31/2015 0521    HGB 10.6 (L) 12/31/2015 0521    HCT 34 12/31/2015 0521    PLT 321 12/31/2015 0521          Assessment:  Progressive diabetic nephropathy.  High risk for progression to ESRD.      Plan:    1. Stage IV Chronic Kidney Disease: Will obtain a renal ultrasound and attempt to obtain old records.  No further diagnostic work-up needed at this time.  Will refer for a stress test, if acceptable, plan referral for transplant evaluation.  Will ask dialysis options coordinator to see patient as well.    2. Proteinuria:  On ACE-I.  Continue.  3. Hypertension: Not controlled.  Increase lisinopril to 10 mg daily.  Advised home monitoring.  BP goal < 130/80  4. Anemia of CKD: Mild.  Monitor only.  5. Bone Mineral Disorder:  Check PTH, vit D, Ca, Phos prior to next  visit.  6. Cardiovascular risk:  Very  high.  On asa and statin.    Follow up:   3 months with a full set of labs before next visit.    Author: Mickel CrowICHARD Celia Friedland, MD  01/10/2016  9:49 AM

## 2016-01-10 NOTE — Patient Instructions (Signed)
I will arrange for a stress test.      I plan to refer you to for a transplant eval once stress test is back.    I will refer you for dialysis options education.      You should update your kidney ultrasound.      Monitor blood pressures at home.  Goal BP < 130/90.    I will assess urine proteinuria.

## 2016-01-15 ENCOUNTER — Ambulatory Visit: Payer: PRIVATE HEALTH INSURANCE | Attending: Cardiology | Admitting: Internal Medicine

## 2016-01-15 ENCOUNTER — Telehealth: Payer: Self-pay

## 2016-01-15 ENCOUNTER — Encounter: Payer: Self-pay | Admitting: Internal Medicine

## 2016-01-15 VITALS — BP 150/80 | HR 87 | Ht 76.0 in | Wt 327.0 lb

## 2016-01-15 DIAGNOSIS — R55 Syncope and collapse: Secondary | ICD-10-CM

## 2016-01-15 DIAGNOSIS — G904 Autonomic dysreflexia: Secondary | ICD-10-CM

## 2016-01-15 NOTE — Telephone Encounter (Signed)
Message left reminding patient of appointment tomorrow.

## 2016-01-15 NOTE — Progress Notes (Addendum)
Cardiology Consult Visit     Dear Windy Cannyarroll, Thomas, MD,PHD,    I had the pleasure of seeing your patient, Anna Ford, in our [facility]iology at [facility] on 01/15/2016.    HPI:  Anna Ford is a 50 y.o. 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     Since her last visit Anna Ford experienced 2 syncope episodes. This as during the time her BG's were critically high >400. She took several boluses of insulin over the course of several hours and was instructed to keep up on her fluids. However that morning, while ambulating her had prodromal symptoms of heart racing, buzzing in her ears, weakness and lightheadedness before experiencing witnessed LOC. No seizure like activity. This happened again soon after she awoke from the first. EMS noted her BG at the time to be 196. There is not a report of her BP. [facility] was [facility]. ECHO at that time showed normal LVEF with LVH. She did nto have any arrhythmias on telemetry. She was discharged after negative orthostatics with an event monitor to follow up with her cardiologist.  Currently she is feeling well. She has had 2 other episodes of lightheadedness for which she triggered her monitor, all with ambulation. Denies any syncope.     PAST MEDICAL HISTORY:  Past Medical History:   Diagnosis Date    Autonomic dysreflexia     CKD (chronic kidney disease) stage 4, GFR 15-29 ml/min     Depression     DM type 1 (diabetes mellitus, type 1)     HLD (hyperlipidemia)     Hypertension     Morbid obesity with BMI of 40.0-44.9, adult     OSA on CPAP     Osteomyelitis     SVT (supraventricular tachycardia)     s/p ablation in 2002    Syncope and collapse 01/2016      x2     Thyroid disease     hypothyroid     REVIEW OF SYSTEMS:  10 point review of systems otherwise negative except as per HPI.    ALLERGIES:  Allergies   Allergen Reactions    Travatan Z [Travoprost] Other (See Comments)           CURRENT MEDICATIONS:  Current Outpatient Prescriptions on File Prior to Visit   Medication Sig Dispense Refill    lisinopril (PRINIVIL,ZESTRIL) 10 MG tablet Take 1 tablet (10 mg total) by mouth nightly 30 tablet 5    levothyroxine (SYNTHROID, LEVOTHROID) 125 MCG tablet Take 2 tablets (250 mcg total) by mouth daily (before breakfast) 60 tablet 5    felodipine (PLENDIL) 2.5 MG 24 hr tablet Take 1 tablet (2.5 mg total) by mouth daily   Swallow whole. Do not crush, break, or chew. 90 tablet 1    other supply Dispense CPAP supplies and accessories 1 each 0    furosemide (LASIX) 40 MG tablet Take 1 tablet (40 mg total) by mouth 2 times daily 60 tablet 2    Insulin Lispro (HUMALOG SC) Inject into the skin      carvedilol (COREG) 6.25 MG tablet Take 12.5 mg by mouth 2 times daily (with meals)  rosuvastatin (CRESTOR) 40 MG tablet Take 40 mg by mouth daily (with dinner)      escitalopram (LEXAPRO) 20 MG tablet Take 20 mg by mouth daily      Cholecalciferol (VITAMIN D-3 PO) Take by mouth      aspirin 81 MG tablet Take 81 mg by mouth daily      cetirizine (ZYRTEC) 10 MG tablet Take 10 mg by mouth daily      fluticasone (FLONASE) 50 MCG/ACT nasal spray 1 spray by Nasal route daily      montelukast (SINGULAIR) 10 MG tablet Take 10 mg by mouth nightly       No current facility-administered medications on file prior to visit.      VITALS: BP 150/80 Comment: 172/84 on arrival-after10 min rest 150/80   Pulse 87   Ht 1.93 m (6\' 4" )   Wt (!) 148.3 kg (327 lb)   SpO2 98%   BMI 39.8 kg/m2  PHYSICAL EXAM:  CONSTITUTIONAL: Pleasant and interactive  NEURO: Alert and oriented in no acute distress  HENT: MMM, oropharynx clear, anicteric sclerae  LYMPH: Supple without lymphadenopathy  NECK  VASCULAR: JVP is approximately 6 cm above the right atrium.  There are no carotid bruits appreciated bilaterally.  CARDIOVASCULAR: A comprehensive cardiovascular exam was performed with details as follows: Regular rhythm, normal S1, prominent S2 no S3 or S4, there are no murmurs or rubs appreciated. PMI is normal in position in character.  2+ radial and dorsalis pedis pulses with no lower extremity edema noted.  PULM: Lungs are clear to auscultation bilaterally without wheezes, rales, or rhonchi.  GI: Soft, nontender, normal active bowel sounds, no abdominal bruits noted.  MSK/EXTREMITIES: No clubbing, cyanosis, and edema as noted above. Left leg BKA with prosthesis. right leg in brace up to knee. Multiple scars on forearms.     LABS:  Lab Results   Component Value Date/Time    CHOL 147 12/31/2015 05:21 AM    HDL 49 12/31/2015 05:21 AM    LDLC 70 12/31/2015 05:21 AM     Lab Results   Component Value Date/Time    NA 139 12/31/2015 05:21 AM    K 4.1 12/31/2015 05:21 AM    AST 24 12/30/2015 12:27 PM    ALT 26 12/30/2015 12:27 PM     ECG:  NSR HR 77, normal axis, possible RA enlargement.     CARDIAC STUDIES:  TTE 12/31/15   Moderately hypertrophied left ventricle with normal systolic function and no segmental wall motion abnormalities. Estimated LVEF is 65-70%.   Left ventricular diastolic function is abnormal, grade 1 (mild).   The size of the right ventricular cavity is within normal limits. RV ejection fraction is normal.   No significant valvular abnormalities.   The estimated RV systolic pressure is within normal limits (24.1 mmHg).   The patient did not have a prior exam to compare this exam's results to.    ASSESSMENT AND RECOMMENDATIONS:  50 yo woman with a history of HTN, HLD, type I DM since age 50 with peripheral neuropathy and s/p left BKA, autonomic dysreflexia, OSA compliant with CPAP, CKD III, Hashimoto's thyroiditis s/p thyroidectomy, SVT s/p ablation in [place] in 2002, prior BKA who presents  today for further evaluation of her presyncopal episodes.     Presyncope, Syncope: Autonomic dysreflexia (in the setting of DM and severe hyperglycemia/dehydration) vs. Less likely recurrent SVT  - continue felodipine to 2.5 mg daily  - continue event monitor for 2 more weeks. Triggered  events for lightheadedness so far have not revealed arrhythmogenic cause  - she was advised on [place] law regarding unexplained syncope.  - continue compression stocking of left leg  - avoiding midodrine due to sodium retention and baseline hypertensive state  - may need to consider abdominal binder.     HTN/HLD: above goal, currently allowing permissive hypertension in the setting of syncope.   -continue lisinopril 5 mg.   -continue coreg 6.25 mg BID  -continue Crestor.     OSA compliant with CPAP with persistent symptoms.   -pending evaluation by sleep medicine     RTC in 1 month    As always, please do not hesitate to contact us if questions or issues arise.  I hope this has been helpful to you and I look forward to continuing to work with you in the future.      Sincerely,    Katrinka Blazing, MD  Cardiology Fellow  [facility] Cardiology at [facility]      I saw and evaluated the patient. I have reviewed and edited Dr. Rosezena Sensor note and confirm the findings and plan of care as documented above.    Osborne Casco, MD

## 2016-01-16 ENCOUNTER — Other Ambulatory Visit
Admission: RE | Admit: 2016-01-16 | Discharge: 2016-01-16 | Disposition: A | Discharge status: Home / Self Care | Payer: PRIVATE HEALTH INSURANCE | Source: Ambulatory Visit | Attending: Internal Medicine | Admitting: Internal Medicine

## 2016-01-16 ENCOUNTER — Ambulatory Visit: Payer: PRIVATE HEALTH INSURANCE | Attending: Internal Medicine | Admitting: Cardiology

## 2016-01-16 VITALS — BP 116/70 | HR 72 | Ht 76.0 in | Wt 321.0 lb

## 2016-01-16 DIAGNOSIS — N189 Chronic kidney disease, unspecified: Secondary | ICD-10-CM | POA: Insufficient documentation

## 2016-01-16 DIAGNOSIS — E109 Type 1 diabetes mellitus without complications: Secondary | ICD-10-CM

## 2016-01-16 DIAGNOSIS — I10 Essential (primary) hypertension: Secondary | ICD-10-CM

## 2016-01-16 DIAGNOSIS — R55 Syncope and collapse: Secondary | ICD-10-CM

## 2016-01-16 DIAGNOSIS — J3489 Other specified disorders of nose and nasal sinuses: Secondary | ICD-10-CM

## 2016-01-16 LAB — COMPREHENSIVE METABOLIC PANEL
ALT: 24 U/L (ref 0–35)
AST: 21 U/L (ref 0–35)
Albumin: 4.2 g/dL (ref 3.5–5.2)
Alk Phos: 95 U/L (ref 35–105)
Anion Gap: 14 (ref 7–16)
Bilirubin,Total: 0.3 mg/dL (ref 0.0–1.2)
CO2: 28 mmol/L (ref 20–28)
Calcium: 10.4 mg/dL — ABNORMAL HIGH (ref 8.6–10.2)
Chloride: 100 mmol/L (ref 96–108)
Creatinine: 3.35 mg/dL — ABNORMAL HIGH (ref 0.51–0.95)
GFR,Black: 17 * — AB
GFR,Caucasian: 15 * — AB
Globulin: 4.7 g/dL — ABNORMAL HIGH (ref 2.7–4.3)
Glucose: 49 mg/dL — ABNORMAL LOW (ref 60–99)
Lab: 60 mg/dL — ABNORMAL HIGH (ref 6–20)
Potassium: 4.6 mmol/L (ref 3.3–5.1)
Sodium: 142 mmol/L (ref 133–145)
Total Protein: 8.9 g/dL — ABNORMAL HIGH (ref 6.3–7.7)

## 2016-01-16 NOTE — Progress Notes (Addendum)
[facility] Note    Name: Anna Ford   DOB: 1965/09/06   Date: 01/16/2016     Past Medical History: has HTN (hypertension); Hypothyroidism; Type 1 diabetes mellitus; SVT (supraventricular tachycardia); S/P BKA (below knee amputation), right; OSA on CPAP; CKD (chronic kidney disease), stage IV; Autonomic dysreflexia; Syncope and collapse; and Morbid obesity on her problem list.      Medications:   Current Outpatient Prescriptions   Medication Sig    lisinopril (PRINIVIL,ZESTRIL) 10 MG tablet Take 1 tablet (10 mg total) by mouth nightly    levothyroxine (SYNTHROID, LEVOTHROID) 125 MCG tablet Take 2 tablets (250 mcg total) by mouth daily (before breakfast)    felodipine (PLENDIL) 2.5 MG 24 hr tablet Take 1 tablet (2.5 mg total) by mouth daily   Swallow whole. Do not crush, break, or chew.    other supply Dispense CPAP supplies and accessories    furosemide (LASIX) 40 MG tablet Take 1 tablet (40 mg total) by mouth 2 times daily    Insulin Lispro (HUMALOG SC) Inject into the skin    carvedilol (COREG) 6.25 MG tablet Take 12.5 mg by mouth 2 times daily (with meals)       rosuvastatin (CRESTOR) 40 MG tablet Take 40 mg by mouth daily (with dinner)    escitalopram (LEXAPRO) 20 MG tablet Take 20 mg by mouth daily    Cholecalciferol (VITAMIN D-3 PO) Take by mouth    aspirin 81 MG tablet Take 81 mg by mouth daily    cetirizine (ZYRTEC) 10 MG tablet Take 10 mg by mouth daily    fluticasone (FLONASE) 50 MCG/ACT nasal spray 1 spray by Nasal route daily    montelukast (SINGULAIR) 10 MG tablet Take 10 mg by mouth nightly     No current facility-administered medications for this visit.         Allergies:   Allergies   Allergen Reactions    Travatan Z [Travoprost] Other (See Comments)             ______________________________________________________________________    HPI/ROS:     Lightheadedness  -Has been good since hospitalization for syncope  -Until this morning in the bathroom she noticed feeling lightheaded and needed  to lay down on the floor to prevent from falling.  -Episode lasted approximately 45 min  -Noted that she felt presyncopal , room was not spinning  -Admits to nausea, no vomiting. Cramping in her legs and hands  -Blood sugar at that time was 155    URI  -Last weekend started having URI symptoms  -Dry cough, laryngitis, sinus congestion  -Sunday improved, but voice still hoarse  -Still has a dry cough  -Dark yellow rhinorrhea  -Notes ear pain  -Sinus pain when leaning forward    CKD  -Cramping  -Concerned her potassium may be high  -Not following any specific diet  -Without Furosemide 40mg  BID patient has trouble urinating, but with it she has no problem    T1DM  -Has a insulin pump  -Cannot see an endocrinologist until December  -Readings have been mid 100s    OSA  -Using CPAP  -Referral sent for sleep medicine doctor    ______________________________________________________________________    Vitals:    01/16/16 1500   BP: 116/70   BP Location: Right arm   Patient Position: Sitting   Cuff Size: large adult   Pulse: 72   Weight: (!) 145.6 kg (321 lb 0.6 oz)   Height: 1.93 m (6\' 4" )  No results found for this or any previous visit (from the past 24 hour(s)).      Exam:     General: NAD, obese woman, well appearing  HEENT: Mild TM inflammation bilaterally. R frontal sinus tenderness bilaterally. No adenopathy. Mild periorbital swelling.  Cardio: RRR  Resp: CTAB. No wheeze, rales, rhonchi  MSK: No joint tenderness or edema.  ______________________________________________________________________  A/P: 88F w/ PMHx CKD3, T1DM, HTN presenting with sinus symptoms, cramping and syncope.    Pre-Syncope; Unlikely as a result of hypo/hyperglycemia this time. Could be related to hypovolemia in the setting of recent illness or protein losing nephropathy resulting in third spacing. Patient also taking 40mg  Lasix BID which may be contributing to hypovolemia  -CMP to evaluate renal function and electrolyte status  -Have patient  call tomorrow with update  -C/w Holter monitor  -Cardiology follow up as scheduled    T1DM; stable for now, but in need of endocrinologist [place] in [place]  -Make sure to attend upcoming endo appt in December for insulin pump management    Sinus Pain; possibly sinusitis, but too early to tell  -Re-evaluate in the next couple of days, may require abx if sxs's not improving    HTN; stable on current regimen  -C/w Lisinopril    CKD3; possibly worsening with signs of fluid overload, electrolyte abnormalities  -CMP today  -C/w Lasix 40mg  BID, may have patient decrease dose if continues to have signs of presyncope      F/U Visit: 6 weeks.

## 2016-01-29 ENCOUNTER — Encounter: Payer: Self-pay | Admitting: Cardiology

## 2016-01-29 ENCOUNTER — Other Ambulatory Visit: Payer: Self-pay | Admitting: Palliative Care

## 2016-01-29 DIAGNOSIS — I10 Essential (primary) hypertension: Secondary | ICD-10-CM

## 2016-01-29 MED ORDER — ESCITALOPRAM OXALATE 20 MG PO TABS *I*
20.0000 mg | ORAL_TABLET | Freq: Every day | ORAL | 5 refills | Status: AC
Start: 2016-01-29 — End: ?

## 2016-01-29 MED ORDER — ROSUVASTATIN CALCIUM 40 MG PO TABS *I*
40.0000 mg | ORAL_TABLET | Freq: Every day | ORAL | 5 refills | Status: AC
Start: 2016-01-29 — End: ?

## 2016-01-29 MED ORDER — FUROSEMIDE 40 MG PO TABS *I*
40.0000 mg | ORAL_TABLET | Freq: Two times a day (BID) | ORAL | 5 refills | Status: AC
Start: 2016-01-29 — End: ?

## 2016-01-30 ENCOUNTER — Ambulatory Visit
Admission: RE | Admit: 2016-01-30 | Discharge: 2016-01-30 | Disposition: A | Discharge status: Home / Self Care | Payer: Self-pay | Source: Ambulatory Visit | Attending: Nephrology | Admitting: Nephrology

## 2016-01-31 ENCOUNTER — Telehealth: Payer: Self-pay

## 2016-01-31 ENCOUNTER — Ambulatory Visit: Payer: Self-pay | Admitting: Endocrinology

## 2016-01-31 ENCOUNTER — Encounter: Payer: Self-pay | Admitting: Endocrinology

## 2016-01-31 VITALS — BP 144/64 | HR 89 | Ht 76.0 in | Wt 327.0 lb

## 2016-01-31 DIAGNOSIS — IMO0002 Reserved for concepts with insufficient information to code with codable children: Secondary | ICD-10-CM

## 2016-01-31 DIAGNOSIS — E038 Other specified hypothyroidism: Secondary | ICD-10-CM

## 2016-01-31 DIAGNOSIS — E063 Autoimmune thyroiditis: Secondary | ICD-10-CM

## 2016-01-31 LAB — POCT HEMOGLOBIN A1C: Hemoglobin A1C,POC: 9.2 % — ABNORMAL HIGH (ref 4.0–6.0)

## 2016-01-31 MED ORDER — INSULIN LISPRO (HUMAN) 100 UNIT/ML IJ/SC SOLN *WRAPPED*
SUBCUTANEOUS | 5 refills | Status: DC
Start: 2016-01-31 — End: 2016-02-04

## 2016-01-31 MED ORDER — INSULIN GLARGINE 100 UNIT/ML SC SOLN *WRAPPED*
70.0000 [IU] | Freq: Every evening | SUBCUTANEOUS | 5 refills | Status: DC
Start: 2016-01-31 — End: 2017-01-16

## 2016-01-31 MED ORDER — GLUCOSE BLOOD VI STRP *A*
ORAL_STRIP | 11 refills | Status: AC
Start: 2016-01-31 — End: ?

## 2016-01-31 NOTE — Patient Instructions (Addendum)
Continue with same dose of levothyroxine for now.  Repeat Thyroid function test in 8 weeks  Follow up in 3 months.  Nutrition consult

## 2016-01-31 NOTE — Telephone Encounter (Signed)
L/m for patient to call back

## 2016-01-31 NOTE — Telephone Encounter (Signed)
Has questions about whether or not to continue wearing monitor.

## 2016-02-03 ENCOUNTER — Encounter: Payer: Self-pay | Admitting: Internal Medicine

## 2016-02-03 ENCOUNTER — Encounter: Payer: Self-pay | Admitting: Endocrinology

## 2016-02-03 NOTE — Progress Notes (Addendum)
Chief Complaint:  Referred by pcp for management of type 1 diabetes.    History of Present Illness: Anna Ford is a 50 y.o. female  with history of type1 DM complicated with retinopathy, neuropathy and nephropathy and initially diagnosed at age 46. Pt recently moved from [place] in august 2017. Pt was born and rasied in [place], [place]. At the time of diagnosis she had symptoms of excessive thirst and urination. Medications tried include long acting insulin and for past 10 years on insulin pump. Pt was recently admitted at [facility] hospital for presyncope and dizziness  Patient has a past medical history of HTN, HLD, s/p left BKA, autonomic dysreflexia, OSA compliant with CPAP, CKD III, Hashimoto's thyroiditis s/p thyroidectomy, SVT s/p ablation in [place] in 2002, who presents today for management of uncontrolled type 1 DM on insulin pump.  Pt insulin pump record was downloaded and reviewed with patient in great detail. It came to our attention that patient is not properly using bolus wizard and not adding Bg in the pump and mostly manual bolus for past month.Pt does not know exactly which food to pick for healthy eating and asking for nutritonal consult.  Patient currently denies symptoms of chest pain, shortness of breath, abdominal pain, nausea, vomiting and diarrhea. Pt denies fever, chills, rigors and sweats. Pt denies eat and cold intolerance. Pt denies loss of hair and does have fatigue and weight gain.  Insulin Pump Settings:   Medtronic MiniMed Paradigm [id number]).  Basal rates:  12 am 3.05 U/hr   6 am 3.05 U/hr   8 am 3.25 U/hr   2 pm 3.05 U/hr   6 pm 3.25U/hr  TDB:74.4  Insulin to carb ratio ICR: 1:3 gram  Sensitivity: 11  Target Bg: 110-110  Active insulin time:5 hour    Microvascular complications:  Yes  Macrovascular complications:  Yes  Hospital admissions: Yes    --Hyperglycemia:  Yes Avg BG 267  --Hypoglycemia: None. Loss of consciousness: no.  Assistance needed: no. Time of day: None  reported  --Endocrine:   Polydipsia: no. Polyuria: no.   --CV: Chest pain: no. Shortness of breath: no  --Extremities: left BKA with artifical prothesis, and right foot transmetatarsals amputation.   -- Hands: Tingling: no. Numbness: no. Pain: no. If yes, degree of discomfort rated (1=least, 10=worst): 5-6  -- Feet: Tingling: no. Numbness: no. Pain: no. If yes, degree of discomfort rated (1=least, 10=worst): 9  -- Eyes: Double/blurry vision: no. Retinopathy: no. Cataracts: no. Glaucoma: no. Last eye exam: in process of looking for eye doctor, last eye exam over a year ago.  -- Diet: High carb diet includes pasta, pizza , ice cream. Lately she is trying to follow a good diet, however she still snack a lot with pretzels.   --Exercise: None  Past Medical History:   Diagnosis Date    Autonomic dysreflexia     CKD (chronic kidney disease) stage 4, GFR 15-29 ml/min     Depression     DM type 1 (diabetes mellitus, type 1)     HLD (hyperlipidemia)     Hypertension     Morbid obesity with BMI of 40.0-44.9, adult     OSA on CPAP     Osteomyelitis     SVT (supraventricular tachycardia)     s/p ablation in 2002    Syncope and collapse 01/2016    x2     Thyroid disease     hypothyroid     REVIEW OF SYSTEMS:  10 point  review of systems otherwise negative except as per HPI.    ALLERGIES:        Allergies   Allergen Reactions    Travatan Z [Travoprost] Other (See Comments)          CURRENT MEDICATIONS:         Current Outpatient Prescriptions on File Prior to Visit   Medication Sig Dispense Refill    lisinopril (PRINIVIL,ZESTRIL) 10 MG tablet Take 1 tablet (10 mg total) by mouth nightly 30 tablet 5    levothyroxine (SYNTHROID, LEVOTHROID) 125 MCG tablet Take 2 tablets (250 mcg total) by mouth daily (before breakfast) 60 tablet 5    felodipine (PLENDIL) 2.5 MG 24 hr tablet Take 1 tablet (2.5 mg total) by mouth daily   Swallow whole. Do not crush, break, or chew. 90 tablet 1    other supply  Dispense CPAP supplies and accessories 1 each 0    furosemide (LASIX) 40 MG tablet Take 1 tablet (40 mg total) by mouth 2 times daily 60 tablet 2    Insulin Lispro (HUMALOG SC) Inject into the skin      carvedilol (COREG) 6.25 MG tablet Take 12.5 mg by mouth 2 times daily (with meals)         rosuvastatin (CRESTOR) 40 MG tablet Take 40 mg by mouth daily (with dinner)      escitalopram (LEXAPRO) 20 MG tablet Take 20 mg by mouth daily      Cholecalciferol (VITAMIN D-3 PO) Take by mouth      aspirin 81 MG tablet Take 81 mg by mouth daily      cetirizine (ZYRTEC) 10 MG tablet Take 10 mg by mouth daily      fluticasone (FLONASE) 50 MCG/ACT nasal spray 1 spray by Nasal route daily      montelukast (SINGULAIR) 10 MG tablet Take 10 mg by mouth nightly         Patient's medications, allergies, past medical, surgical, social and family histories were reviewed and updated as appropriate.     Social History:  Lives with aunt  Works as Statisticiannurse instructor  Alcohol: None  Tobacco: None  Recreational drugs: None     Family History:    Mother: DM  Review of Systems:  Gen: no fever, chills, + fatigue, unintentional weight changes, night sweats  Eyes: +blurry vision, double vision, no dryness or irritation, or loss of vision  ENT: denies dry mouth, runny nose, ringing in ears, change in voice, sore throat, heavy snoring, hearing loss, sinus congestion  CV: denies chest discomfort, pressures, palpitations, claudication, fainting or leg swelling  Lungs: denies cough, dyspnea, wheezing, sputum production  GI: denies diarrhea, constipation, nausea, vomiting, abdominal pain, heartburn, change in appetite, dysphagia  Renal: denies difficulty urinating, dysuria, polyuria, hematuria, nocturia, incomplete emptying, incontinence of urine  Musk: denies decreased strength, cramping, pain  Neuro: denies memory loss, tremor, muscle weakness, involuntary movement, falls, dizziness, numbness or tingling, frequent or severe  headaches  Endo: denies hot flashes, excessive thirst, heat/cold intolerance  Remainder of ROS negative except per HPI.    Physical Exam:  Vitals: BP 144/64   Pulse 89   Ht 1.93 m (6\' 4" )   Wt (!) 148.3 kg (327 lb)   BMI 39.8 kg/m2  Gen: NAD, obese   Psych: pleasant and cooperative  Eyes: EOMI, no lid lag  ENT: normal hearing, oral mucosa moist  Neck: supple, no thyromegaly, no palpable thyroid nodules  Lungs: CTA b/l with symmetrical expansion  CV: +S1S2  regular rhythm, no murmurs   Abd: +BS, soft, NT/ND  Neuro: alert, fluent and cogent; 2+ patellar and bicep reflexes, no tremor, abnormal gait  Derm: no rashes; no striae, no hypertrophy insulin injection sites  Foot: s/p left BKA, and right foot transmetatarsal amputation, remaining foot exam no open wound or ulcer, positive monofilament test and positive for neuropathy    Data Review:Yes     Imaging review: Yes    Medical Record Review: Yes     Laboratory review:   Ref. Range 11/05/2015 14:45 12/30/2015 12:27   TSH Latest Ref Range: 0.27 - 4.20 uIU/mL 0.46 5.44 (H)   Free T4 Latest Ref Range: 0.9 - 1.7 ng/dL 2.0 (H)      Assessment and Plan:   50 yo woman with a history of uncontrolled type I DM complicated by peripheral neuropathy, retinopathy, nephropathy, PVD and s/p left BKA, with HTN, HLD, autonomic dysreflexia, with OSA compliant with CPAP, CKD III, Hashimoto's thyroiditis s/p thyroidectomy, SVT s/p ablation in [place] in 2002, on insulin pump who presents today for management of uncontrolled type 1 Dm as evident by most recent A1c of 9.2% as well as by pump readings download. The current patient basal to bolus ratio is off. We advise patient to do proper carb counting enter BG's in bolus wizard so proper bolus is given for food.  Pt is currently waiting on new insulin pump and sensor which is in a storage and she is not sure when she will have access to both.     Hypothyroidism: Pt has acquired hypothyroidism secondary to thyroidectomy for Hashimoto  thyroiditis. Pt levothyroxine does was recently decrease to 250 mcg daily in September 2017 and her most recent TSH is 5.44 which is slight elevated. At this time we will recommend not to increase levothyroxine dose rather we will repeat TFT's in 8 weeks.we counsel patient to take thyroid empty stomach and should not combine with iron and MVI.     1. Uncontrolled Type 1 DM- Insulin pump:  - No change in pump setting at this time.  - We will sent Lantus and Humalog for emergencies  - Nutrition consult    - Urine microalbumin to be repeated at next appointment  - Advise patient to schedule annual eye exam  - Refilled scripts for lancets, test strips  - follow up in 3 month    2. Acquired Hypothyroidism:  - Continue levothyroxine 250 mcg daily.  - Repeat thyroid function test in 8 weeks.     3. HTN:   - BP: 144/64 not at goal of less than 130/80.   - Encouraged diet and exercise.   - Continue current management for now.  - Management as per primary team.      4. Known Diabetic neuropathy:   - Diabetic neuropathy: Yes  - monofilament exam done and was negative     5. Know Diabetic retinopathy:    - Previous eye exam shows retinopathy need to have a tighter glycemic control to avoid blindness.    6. Know Diabetic nephropathy:    - Will will check UMA at next follow up appoinmtent.  - Nephrology consult recommended as per PCP.     7. Morbid obesity:  Need to reduce caloric intake further - continue to decrease portion sizes. Aim for 45-50 grams of carbs per meal  Continue to discuss dietary changes with our CDE. Will work on healthy snacks and diet ice tea Exercise goal: Aim to move more. Start walking 30  minutes daily, 5 times a week.     RTC in 3 months      Than you for allowing [place] to participate in this patient care.       Norma Fredrickson, MD  Endocrinology fellow    Patient was seen and case was discussed with Dr Hinda Lenis.    I saw and evaluated the patient. I agree with Dr. Michail Sermon 's findings and plan of care as  documented above.      Renda Rolls, MD

## 2016-02-04 MED ORDER — INSULIN ASPART 100 UNIT/ML IJ SOLN *I*
INTRAMUSCULAR | 3 refills | Status: DC
Start: 2016-02-04 — End: 2016-09-10

## 2016-02-04 NOTE — Telephone Encounter (Signed)
Spoke with patient, advised her that study time had been completed and she will go ahead and mail the event monitor back to the vendor.

## 2016-02-04 NOTE — Telephone Encounter (Signed)
Ret\urning a message from the office.

## 2016-02-12 ENCOUNTER — Encounter: Payer: Self-pay | Admitting: Internal Medicine

## 2016-02-12 ENCOUNTER — Ambulatory Visit: Payer: Self-pay | Admitting: Internal Medicine

## 2016-02-12 ENCOUNTER — Ambulatory Visit: Payer: PRIVATE HEALTH INSURANCE | Attending: Cardiology | Admitting: Internal Medicine

## 2016-02-12 VITALS — BP 142/80 | HR 102 | Ht 76.0 in | Wt 325.4 lb

## 2016-02-12 DIAGNOSIS — I10 Essential (primary) hypertension: Secondary | ICD-10-CM

## 2016-02-12 MED ORDER — LISINOPRIL 20 MG PO TABS *I*
20.0000 mg | ORAL_TABLET | Freq: Every day | ORAL | 5 refills | Status: AC
Start: 2016-02-12 — End: ?

## 2016-02-12 NOTE — Progress Notes (Addendum)
Cardiology Consult Visit     Dear Windy Cannyarroll, Thomas, MD,PHD,    I had the pleasure of seeing your patient, Anna Ford, in our [facility] on 02/12/2016.    HPI:  Anna Manlise Ruotolo is a 50 y.o. female with a history of HTN, HLD, type I DM since age 50 with peripheral neuropathy and s/p left BKA, autonomic dysreflexia, OSA compliant with CPAP, CKD III, Hashimoto's thyroiditis s/p thyroidectomy, SVT s/p ablation in [place] in 2002, prior BKA who presents today for follow up of her presyncope.     She experienced several episodes of near syncope while wearing her event monitor over the past month without correlating arrhythmias. Her symptoms are most likely caused by her progressive autonomic dysfunction related to her long standing DM (>40 years). She reports prodromal symptoms of dizziness and it is related to positional changes. Denies syncope while sitting or lying down. She denies any other associated symptoms. Denies HA, vision changes, CP or SOB. Feeling tired today with the weather changes. Wearing her compression stocking and has been trying to keep up on her fluids.     PAST MEDICAL HISTORY:  Past Medical History:   Diagnosis Date    Autonomic dysreflexia     CKD (chronic kidney disease) stage 4, GFR 15-29 ml/min     Depression     DM type 1 (diabetes mellitus, type 1)     HLD (hyperlipidemia)     Hypertension     Morbid obesity with BMI of 40.0-44.9, adult     OSA on CPAP     Osteomyelitis     SVT (supraventricular tachycardia)     s/p ablation in 2002    Syncope and collapse 01/2016    x2     Thyroid disease     hypothyroid     REVIEW OF SYSTEMS:  10 point review of systems otherwise negative except as per HPI.    ALLERGIES:  Allergies   Allergen Reactions    Travatan Z [Travoprost] Other (See Comments)           CURRENT MEDICATIONS:  Current Outpatient Prescriptions on File Prior to Visit   Medication Sig Dispense Refill    insulin aspart (NOVOLOG) 100 UNIT/ML  injection vial Maximum 150 Units/day 60 mL 3    blood glucose (ONETOUCH VERIO) test strip Test 4 times a day.   Brand name of stripes verio 120 strip 11    insulin glargine (LANTUS) 100 UNIT/ML injection vial Inject 70 Units into the skin nightly 5 mL 5    furosemide (LASIX) 40 MG tablet Take 1 tablet (40 mg total) by mouth 2 times daily 60 tablet 5    escitalopram (LEXAPRO) 20 MG tablet Take 1 tablet (20 mg total) by mouth daily 30 tablet 5    rosuvastatin (CRESTOR) 40 MG tablet Take 1 tablet (40 mg total) by mouth daily (with dinner) 30 tablet 5    levothyroxine (SYNTHROID, LEVOTHROID) 125 MCG tablet Take 2 tablets (250 mcg total) by mouth daily (before breakfast) 60 tablet 5    felodipine (PLENDIL) 2.5 MG 24 hr tablet Take 1 tablet (2.5 mg total) by mouth daily   Swallow whole. Do not crush, break, or chew. 90 tablet 1    other supply Dispense CPAP supplies and accessories 1 each 0    Cholecalciferol (VITAMIN D-3 PO) Take by mouth      aspirin 81 MG tablet Take 81 mg by mouth daily  cetirizine (ZYRTEC) 10 MG tablet Take 10 mg by mouth daily      fluticasone (FLONASE) 50 MCG/ACT nasal spray 1 spray by Nasal route daily      montelukast (SINGULAIR) 10 MG tablet Take 10 mg by mouth nightly       No current facility-administered medications on file prior to visit.      VITALS: BP 142/80   Pulse 102   Ht 1.93 m (6\' 4" )   Wt (!) 147.6 kg (325 lb 6.4 oz)   SpO2 95%   BMI 39.61 kg/m2  PHYSICAL EXAM:  CONSTITUTIONAL: Pleasant and interactive  NEURO: Alert and oriented in no acute distress  HENT: MMM, oropharynx clear, anicteric sclerae  LYMPH: Supple without lymphadenopathy  NECK VASCULAR: JVP is approximately 6 cm above the right atrium.  There are no carotid bruits appreciated bilaterally.  CARDIOVASCULAR: A comprehensive cardiovascular exam was performed with details as follows: Regular rhythm, normal S1, prominent S2 no S3 or S4, there are no murmurs or rubs appreciated. PMI is normal in position in  character.  2+ radial and dorsalis pedis pulses with no lower extremity edema noted.  PULM: Lungs are clear to auscultation bilaterally without wheezes, rales, or rhonchi.  GI: Soft, nontender, normal active bowel sounds, no abdominal bruits noted.  MSK/EXTREMITIES: No clubbing, cyanosis, and edema as noted above. Left leg BKA with prosthesis. right leg in brace up to knee. Multiple scars on forearms.     LABS:  Lab Results   Component Value Date/Time    CHOL 147 12/31/2015 05:21 AM    HDL 49 12/31/2015 05:21 AM    LDLC 70 12/31/2015 05:21 AM     Lab Results   Component Value Date/Time    NA 142 01/16/2016 03:59 PM    K 4.6 01/16/2016 03:59 PM    AST 21 01/16/2016 03:59 PM    AST 24 12/30/2015 12:27 PM    ALT 24 01/16/2016 03:59 PM    ALT 26 12/30/2015 12:27 PM     ECG:  NSR HR 77, normal axis, possible RA enlargement.     CARDIAC STUDIES:  TTE 12/31/15   Moderately hypertrophied left ventricle with normal systolic function and no segmental wall motion abnormalities. Estimated LVEF is 65-70%.   Left ventricular diastolic function is abnormal, grade 1 (mild).   The size of the right ventricular cavity is within normal limits. RV ejection fraction is normal.   No significant valvular abnormalities.   The estimated RV systolic pressure is within normal limits (24.1 mmHg).   The patient did not have a prior exam to compare this exam's results to.    ASSESSMENT AND RECOMMENDATIONS:  [age] yo woman with a history of HTN, HLD, type I DM since age 50 with peripheral neuropathy and s/p left BKA, autonomic dysreflexia, OSA compliant with CPAP, CKD III, Hashimoto's thyroiditis s/p thyroidectomy, SVT s/p ablation in [place] in 2002, prior BKA who presents today for follow up of her presyncope related to her autonomic dysfunction.     Presyncope, Syncope: Autonomic dysreflexia (in the setting of DM and severe hyperglycemia/dehydration)   - discontinue coreg  - event monitor negative for arrhythmogenic cause  - continue  compression stocking of left leg  - avoiding midodrine due to sodium retention and baseline hypertensive state     HTN/HLD: above goal, currently allowing some permissive hypertension in the setting of syncope.   - increase lisinopril to 20 mg daily  - check BMP in 1 week   -  discontinue coreg 6.25 mg BID  - continue felodipine 2.5 mg daily for now. Likely discontinue if still having symptoms.   - continue Crestor.     OSA compliant with CPAP     RTC in 1 month    As always, please do not hesitate to contact us if questions or issues arise.  I hope this has been helpful to you and I look forward to continuing to work with you in the future.      Sincerely,    Katrinka Blazing, MD  Cardiology Fellow  [facility] at [facility]          Cardiology Attending Addendum  Patient seen and evaluated with Dr Luisa Hart- I agree with the above findings and assessment.  Note edited as appropriate.  Celica Kotowski is a 50 y.o. female with complications of longstanding DM1 and morbid obesity that was seen today for follow-up.  She continues to have orthostatic symptoms secondary to autonomic insufficiency.  Will eliminated beta blocker to allow daily dosing of medications.  Plan to titrate ACE inhibitor as well. Theressa Stamps, MD

## 2016-02-27 ENCOUNTER — Other Ambulatory Visit
Admission: RE | Admit: 2016-02-27 | Discharge: 2016-02-27 | Disposition: A | Discharge status: Home / Self Care | Payer: PRIVATE HEALTH INSURANCE | Source: Ambulatory Visit | Attending: Internal Medicine | Admitting: Internal Medicine

## 2016-02-27 ENCOUNTER — Ambulatory Visit: Payer: PRIVATE HEALTH INSURANCE | Attending: Internal Medicine | Admitting: Internal Medicine

## 2016-02-27 VITALS — BP 138/82 | HR 84 | Ht 76.0 in | Wt 327.4 lb

## 2016-02-27 DIAGNOSIS — R109 Unspecified abdominal pain: Secondary | ICD-10-CM

## 2016-02-27 DIAGNOSIS — Z0000 Encounter for general adult medical examination without abnormal findings: Secondary | ICD-10-CM

## 2016-02-27 DIAGNOSIS — I10 Essential (primary) hypertension: Secondary | ICD-10-CM

## 2016-02-27 DIAGNOSIS — I5030 Unspecified diastolic (congestive) heart failure: Secondary | ICD-10-CM | POA: Insufficient documentation

## 2016-02-27 DIAGNOSIS — E109 Type 1 diabetes mellitus without complications: Secondary | ICD-10-CM

## 2016-02-27 DIAGNOSIS — N184 Chronic kidney disease, stage 4 (severe): Secondary | ICD-10-CM

## 2016-02-27 DIAGNOSIS — G904 Autonomic dysreflexia: Secondary | ICD-10-CM

## 2016-02-27 DIAGNOSIS — M25649 Stiffness of unspecified hand, not elsewhere classified: Secondary | ICD-10-CM

## 2016-02-27 LAB — URINALYSIS REFLEX TO CULTURE
Ketones, UA: NEGATIVE
Nitrite,UA: NEGATIVE
Protein,UA: 75 mg/dL — AB
Specific Gravity,UA: 1.011 (ref 1.001–1.030)
pH,UA: 5 (ref 5.0–8.0)

## 2016-02-27 LAB — URINE MICROSCOPIC (IQ200): WBC,UA: >50 /hpf — AB (ref 0–5)

## 2016-02-27 LAB — BASIC METABOLIC PANEL
Anion Gap: 12 (ref 7–16)
CO2: 26 mmol/L (ref 20–28)
Calcium: 9.6 mg/dL (ref 8.6–10.2)
Chloride: 100 mmol/L (ref 96–108)
Creatinine: 2.38 mg/dL — ABNORMAL HIGH (ref 0.51–0.95)
GFR,Black: 26 * — AB
GFR,Caucasian: 23 * — AB
Glucose: 61 mg/dL (ref 60–99)
Lab: 45 mg/dL — ABNORMAL HIGH (ref 6–20)
Potassium: 4.8 mmol/L (ref 3.3–5.1)
Sodium: 138 mmol/L (ref 133–145)

## 2016-02-27 NOTE — Progress Notes (Signed)
[facility] Note      Chief Complaint: routine follow up    HPI:  Anna Ford is a 50 y.o. female with PMH significant for DMT1, CKD IV, HFpEF,HTN, hypothyroidism who presents for routine follow up.     Lightheadedness: Continue to have intermittent episodes but less frequent than when she first came to [place]. Confused what the cardiologist told her, she stopped the felodipine instead of the coreg.     Back pain: Reports right flank pain, present for about 1 month, which is increasing infrequency and severity. Pain ranging from 1-5/10. Pain is there most of the time but will go away at times. Most noticeable when leaning back in a chair and pressure is on the right side of her back. No pain or burning with urinary, but does endorse a funny smell. Denies hematuria. Denies hx of kidney stones. No new weakness in the legs or pain shooting down the legs.     HTN/HLD: Lisinopril at 20 mg daily and coreg. Stopped felodipine. ASA and Crestor.     Hand aching: Noticing aching in the hands over the last week or two. Morning stiffness lasting about 1-2 hours, not every morning. No swelling or redness of the joints. Hx of carpal tunnel s/p surgery on the right. Her sister has a hx of RA.     Healthcare maintenance:  - Reports due for mammography, called to schedule and was told she needed order for diagnostic d/t history of cystic breast changes.     Denies CP, SOB, abd pain, diarrhea, fevers, chills. Endorsing tiring easily.     Medications:   Current Outpatient Prescriptions   Medication Sig    lisinopril (PRINIVIL,ZESTRIL) 20 MG tablet Take 1 tablet (20 mg total) by mouth daily    insulin aspart (NOVOLOG) 100 UNIT/ML injection vial Maximum 150 Units/day    blood glucose (ONETOUCH VERIO) test strip Test 4 times a day.   Brand name of stripes verio    insulin glargine (LANTUS) 100 UNIT/ML injection vial Inject 70 Units into the skin nightly    furosemide (LASIX) 40 MG tablet Take 1 tablet (40 mg total) by  mouth 2 times daily    escitalopram (LEXAPRO) 20 MG tablet Take 1 tablet (20 mg total) by mouth daily    rosuvastatin (CRESTOR) 40 MG tablet Take 1 tablet (40 mg total) by mouth daily (with dinner)    levothyroxine (SYNTHROID, LEVOTHROID) 125 MCG tablet Take 2 tablets (250 mcg total) by mouth daily (before breakfast)    other supply Dispense CPAP supplies and accessories    Cholecalciferol (VITAMIN D-3 PO) Take by mouth    aspirin 81 MG tablet Take 81 mg by mouth daily    cetirizine (ZYRTEC) 10 MG tablet Take 10 mg by mouth daily    fluticasone (FLONASE) 50 MCG/ACT nasal spray 1 spray by Nasal route daily    montelukast (SINGULAIR) 10 MG tablet Take 10 mg by mouth nightly    felodipine (PLENDIL) 2.5 MG 24 hr tablet Take 1 tablet (2.5 mg total) by mouth daily   Swallow whole. Do not crush, break, or chew.     No current facility-administered medications for this visit.       Review of Systems - negative except as above.     Exam:   Vitals:    02/27/16 1501   BP: 138/82   Pulse: 84   Weight: (!) 148.5 kg (327 lb 6.4 oz)   Height: 1.93 m (6' 4")  General: Well appearing, pleasant, in NAD  HEENT: NCAT, MMM, anicteric  Cardiac: RRR no m/r/g  Resp: NWOB, CTAB  MSK: No TTP along the lumbar spine or along paraspinal muscle b/l. Hands b/l with thickening of several PIP joints b/l, with mild TTP, there is also mild swelling proximal to the 2nd MCP joint b/l without erythema, warmth, or TTP.   GU: +CVA tenderness b/l  Ext: Warm, BKA on right, no LEE on left.   Neuro: Awake, alert, face symmetric, moving extremities, speech and comprehension in tact   Skin: Many hyperpigmented lesions noted on the exposed skin of the b/l hands and forearms.     Labs:  Reviewed and notable for:  - POCT Hgb A1c, 01/31/16: 9.2  - BMP, 01/16/16: K 4.6, Cr 3.35, GFR 17    ______________________________________________________________________  Diagnoses and all orders for this visit:    Diagnoses and all orders for this visit:    Right  flank pain: +CVA tenderness b/l, no muscular tenderness, or spinal tenderness. No stones on renal U/S from 01/10/16, however, could have formed stones in the interim. No overt signs overt signs of UTI. Will check a UA to assess for signs of infection or microscopic hematuria. Consider repeat renal U/S.   -     Urinalysis with reflex to culture; Future    Joint stiffness of b/l hands: Synovitis with mild TTP of several PIP joints, no joint swelling or redness, morning stiffness of 1-2 hours.  D/t hx of DMT1 and sister with RA, concern for RA, will check for RF, CPP, ESR, and CRP.  -     C reactive protein; Future  -     Sedimentation rate, automated; Future  -     CYCLIC CITRULLINATED PEPTIDE; Future  -     RHEUMATOID FACTOR,SCREEN; Future    HFpEF/HTN/Autonomic dysreflexia: Pt stopped felodipine instead of coreg. Will stop coreg at this time. Per cardiology note, they were considering stopping felodipine, so will not resume at this time. Encouraged pt to go to lab after visit to have BMP drawn. BP well controlled today.     CKD (chronic kidney disease), stage IV: Following with nephrology, encouraged her to call their office to enquire about stress testing which was ordered but not yet scheduled. BMP as above.     Type 1 diabetes mellitus: Following with endocrinology. Repeat Hgb A1c at next visit with endocrine. No changes to pump insulin. Encouraged dietary control.     Health care maintenance  -     Mammography diagnostic bilateral; Future     Follow up in 3-4 months or sooner if needed.     Anat Kohn, MD 02/27/2016 at 4:30 PM   PGY3 Internal Medicine  Pager: [phone]

## 2016-02-27 NOTE — Patient Instructions (Addendum)
Get blood work done today and leave a urine sample.     STOP Coreg and Felodipine. Continue Lisinopril. No other medication changes.     Call Dr. Eden EmmsWing's office about scheduling the stress test. [phone].     Call [facility] to schedule appointment, regarding CPAP settings. [phone]

## 2016-02-29 LAB — AEROBIC CULTURE

## 2016-03-03 ENCOUNTER — Telehealth: Payer: Self-pay | Admitting: Internal Medicine

## 2016-03-03 DIAGNOSIS — R109 Unspecified abdominal pain: Secondary | ICD-10-CM

## 2016-03-03 DIAGNOSIS — R8279 Other abnormal findings on microbiological examination of urine: Secondary | ICD-10-CM

## 2016-03-03 NOTE — Telephone Encounter (Signed)
Pt is returning call.  

## 2016-03-03 NOTE — Telephone Encounter (Signed)
Attempted to reach pt to discuss results of UA/UCx. No answer, left a message to call the office.     UCx with multi-drug resistant Klebsiella, no oral options for treatment of ?pyelo d/t flank pain. This is the 3rd time in recent months, pt has grown this strain of klebsiella with concern for an untreated nidus of infection, ?stone. When pt calls back will assess current sx, if febrile will send to ED for work up/treatment. Otherwise will obtain CT IVP to assess for stones and refer to urology.     Gwynneth AlimentAnat Geryl Dohn, MD 03/03/16 10:58 AM

## 2016-03-03 NOTE — Telephone Encounter (Signed)
Attempted to reach pt again. No answer. Left another voicemail to call back.     Gwynneth AlimentAnat Breckin Zafar, MD 03/03/16 4:40 PM

## 2016-03-04 NOTE — Telephone Encounter (Signed)
Pt returned Dr. Shanda BumpsKohn's phone call. She will call back around 1 pm today to speak with Dr. Lennette BihariKohn.

## 2016-03-04 NOTE — Telephone Encounter (Signed)
Pt reports this is the 4th or 5th time she has had a UTI, and was treated 2-3 times in [place] prior to moving to [place]. Denies fever, chills, urinary sx, continues to have intermittent right flank pain.     Discussed concern for infected stone or other nidus for recurrent infection with the same strain of klebsiella. Also discussed that the bacteria is multidrug resistant and that there is 1 oral option for treatment of UTI and no oral options for treatment of pyelo. Counseled her to present to the ED if developing fevers, chills for work up of pyelo and IV abx.     Agreeable to CT a/p non-con and referral to urology for recurrent UTI.     Gwynneth AlimentAnat Mavric Cortright, MD 03/04/16 1:56 PM

## 2016-03-04 NOTE — Telephone Encounter (Signed)
Patient returning Dr. Shanda BumpsKohn's call.

## 2016-03-09 ENCOUNTER — Ambulatory Visit: Payer: PRIVATE HEALTH INSURANCE | Admitting: Nutrition

## 2016-03-11 ENCOUNTER — Telehealth: Payer: Self-pay

## 2016-03-11 NOTE — Telephone Encounter (Signed)
Left VM to schedule pump appointment with Myrna.

## 2016-03-19 ENCOUNTER — Telehealth: Payer: Self-pay | Admitting: Internal Medicine

## 2016-03-19 NOTE — Telephone Encounter (Signed)
Received call from patient regarding upcoming dental procedure.  PT wondering if she need PPX antibiotics due to hip replacement. Please advise

## 2016-03-19 NOTE — Telephone Encounter (Signed)
Patient aware.

## 2016-03-19 NOTE — Telephone Encounter (Signed)
Left message for patient to call nurse back.

## 2016-03-19 NOTE — Telephone Encounter (Signed)
No need for abx ppx for joint replacements.  Thanks.

## 2016-03-25 ENCOUNTER — Telehealth: Payer: Self-pay

## 2016-03-25 NOTE — Telephone Encounter (Signed)
Called patient and rescheduled her appointment on 03/27/16 with Pat PatrickJamie Allen NP to 03/26/16 at 2:00p with Jenean LindauShawna Hyland NP at [facility].

## 2016-03-26 ENCOUNTER — Ambulatory Visit: Payer: Self-pay | Admitting: Urology

## 2016-03-27 ENCOUNTER — Ambulatory Visit: Payer: Self-pay | Admitting: Urology

## 2016-04-01 ENCOUNTER — Other Ambulatory Visit: Payer: Self-pay | Admitting: Internal Medicine

## 2016-04-09 ENCOUNTER — Encounter: Payer: Self-pay | Admitting: Nephrology

## 2016-04-09 ENCOUNTER — Other Ambulatory Visit
Admission: RE | Admit: 2016-04-09 | Discharge: 2016-04-09 | Disposition: A | Discharge status: Home / Self Care | Payer: PRIVATE HEALTH INSURANCE | Source: Ambulatory Visit | Attending: Internal Medicine | Admitting: Internal Medicine

## 2016-04-09 DIAGNOSIS — E063 Autoimmune thyroiditis: Secondary | ICD-10-CM | POA: Insufficient documentation

## 2016-04-09 DIAGNOSIS — I1310 Hypertensive heart and chronic kidney disease without heart failure, with stage 1 through stage 4 chronic kidney disease, or unspecified chronic kidney disease: Secondary | ICD-10-CM | POA: Insufficient documentation

## 2016-04-09 DIAGNOSIS — E1021 Type 1 diabetes mellitus with diabetic nephropathy: Secondary | ICD-10-CM | POA: Insufficient documentation

## 2016-04-09 DIAGNOSIS — N184 Chronic kidney disease, stage 4 (severe): Secondary | ICD-10-CM | POA: Insufficient documentation

## 2016-04-09 DIAGNOSIS — E038 Other specified hypothyroidism: Secondary | ICD-10-CM | POA: Insufficient documentation

## 2016-04-09 DIAGNOSIS — N2581 Secondary hyperparathyroidism of renal origin: Secondary | ICD-10-CM | POA: Insufficient documentation

## 2016-04-09 DIAGNOSIS — D631 Anemia in chronic kidney disease: Secondary | ICD-10-CM | POA: Insufficient documentation

## 2016-04-09 DIAGNOSIS — M25649 Stiffness of unspecified hand, not elsewhere classified: Secondary | ICD-10-CM | POA: Insufficient documentation

## 2016-04-09 LAB — CBC
Hematocrit: 33 % — ABNORMAL LOW (ref 34–45)
Hemoglobin: 10.1 g/dL — ABNORMAL LOW (ref 11.2–15.7)
MCH: 21 pg — ABNORMAL LOW (ref 26–32)
MCHC: 30 g/dL — ABNORMAL LOW (ref 32–36)
MCV: 71 fL — ABNORMAL LOW (ref 79–95)
Platelets: 279 10*3/uL (ref 160–370)
RBC: 4.7 MIL/uL (ref 3.9–5.2)
RDW: 14 % (ref 11.7–14.4)
WBC: 5.8 10*3/uL (ref 4.0–10.0)

## 2016-04-09 LAB — TIBC
Iron: 46 ug/dL (ref 34–165)
TIBC: 252 ug/dL (ref 250–450)
Transferrin Saturation: 18 % (ref 15–50)

## 2016-04-09 LAB — PROTEIN,UR + CREAT,UR WITH RATIO
Creatinine,UR: 142 mg/dL (ref 20–300)
Protein,UR: 75 mg/dL
TP Creatinine ratio,UR: 0.53

## 2016-04-09 LAB — FERRITIN: Ferritin: 310 ng/mL — ABNORMAL HIGH (ref 10–120)

## 2016-04-09 LAB — RENAL FUNCTION PANEL
Albumin: 4.2 g/dL (ref 3.5–5.2)
Anion Gap: 14 (ref 7–16)
CO2: 26 mmol/L (ref 20–28)
Calcium: 9 mg/dL (ref 8.6–10.2)
Chloride: 96 mmol/L (ref 96–108)
Creatinine: 2.71 mg/dL — ABNORMAL HIGH (ref 0.51–0.95)
GFR,Black: 22 * — AB
GFR,Caucasian: 19 * — AB
Glucose: 111 mg/dL — ABNORMAL HIGH (ref 60–99)
Lab: 48 mg/dL — ABNORMAL HIGH (ref 6–20)
Phosphorus: 4.5 mg/dL (ref 2.7–4.5)
Potassium: 4.9 mmol/L (ref 3.3–5.1)
Sodium: 136 mmol/L (ref 133–145)

## 2016-04-09 LAB — MICROALBUMIN, URINE, RANDOM
Microalb/Creat Ratio: 192.2 mg MA/g CR — ABNORMAL HIGH (ref 0.0–29.9)
Microalbumin,UR: 27.29 mg/dL

## 2016-04-09 LAB — MULTIPLE ORDERING DOCS

## 2016-04-09 LAB — CRP: CRP: 63 mg/L — ABNORMAL HIGH (ref 0–10)

## 2016-04-09 LAB — PTH, INTACT: Intact PTH: 118.9 pg/mL — ABNORMAL HIGH (ref 15.0–65.0)

## 2016-04-09 LAB — URIC ACID: Urate: 7.9 mg/dL — ABNORMAL HIGH (ref 2.7–6.8)

## 2016-04-09 LAB — TSH: TSH: 0.01 u[IU]/mL — ABNORMAL LOW (ref 0.27–4.20)

## 2016-04-09 LAB — T4, FREE: Free T4: 1.6 ng/dL (ref 0.9–1.7)

## 2016-04-09 LAB — SEDIMENTATION RATE, AUTOMATED: Sedimentation Rate: 39 mm/hr — ABNORMAL HIGH (ref 0–30)

## 2016-04-10 ENCOUNTER — Encounter: Payer: Self-pay | Admitting: Endocrinology

## 2016-04-10 LAB — CYCLIC CITRULLINATED PEPTIDE: Cyclic Citrullin Peptide Ab: 4 U (ref 0–19)

## 2016-04-10 LAB — RHEUMATOID FACTOR,SCREEN: Rheumatoid Factor: <10 IU/mL

## 2016-04-11 NOTE — Telephone Encounter (Signed)
Chart reviewed.

## 2016-04-14 ENCOUNTER — Ambulatory Visit: Payer: Self-pay | Admitting: Nephrology

## 2016-04-14 LAB — VITAMIN D
25-OH VIT D2: <4 ng/mL
25-OH VIT D3: 46 ng/mL
25-OH Vit Total: 46 ng/mL (ref 30–60)

## 2016-04-15 ENCOUNTER — Telehealth: Payer: Self-pay | Admitting: Palliative Care

## 2016-04-15 ENCOUNTER — Ambulatory Visit: Admission: RE | Admit: 2016-04-15 | Payer: PRIVATE HEALTH INSURANCE | Source: Ambulatory Visit

## 2016-04-15 DIAGNOSIS — N63 Unspecified lump in unspecified breast: Secondary | ICD-10-CM

## 2016-04-15 NOTE — Telephone Encounter (Signed)
Imaging aware, however order needs to be placed for Bilateral Diagnostic Mammo - Code is IMG [id number].  Per Imaging, needs to be for diagnostic not screening as there is a known problem.  US order is fine.

## 2016-04-15 NOTE — Telephone Encounter (Signed)
Order placed. Accidentally released it so re-ordered, hopefully that works.    Please let me know if there is anything else I can do.    Author: Gerlene BurdockAnna Zanai Mallari, DO   [facility], PGY-1  04/15/2016 9:32 PM

## 2016-04-15 NOTE — Telephone Encounter (Signed)
Orders placed for bilateral screening mammography and left breast US.    Author: Gerlene BurdockAnna Kirrah Mustin, DO   [facility], PGY-1  04/15/2016 12:27 PM

## 2016-04-15 NOTE — Addendum Note (Signed)
Addended by: Gerlene BurdockKENNEY, Chaelyn Bunyan on: 04/15/2016 09:36 PM     Modules accepted: Orders

## 2016-04-15 NOTE — Telephone Encounter (Signed)
Patient has mass on left breast, and [facility] looking for an order for US of Bilateral breasts.  Patient was there this AM, but will return to imaging center once order has been placed.  Imaging center requests return call once orders have been placed.

## 2016-04-15 NOTE — Telephone Encounter (Signed)
Received a call from imaging today that pt is there for an emergency appointment due to a lump on her left breast that was discovered. They are asking for orders to be placed for a mammogram as well as an ultrasound of left breast. Thank you

## 2016-04-17 ENCOUNTER — Other Ambulatory Visit: Payer: Self-pay | Admitting: Internal Medicine

## 2016-04-17 DIAGNOSIS — N6320 Unspecified lump in the left breast, unspecified quadrant: Secondary | ICD-10-CM

## 2016-04-22 ENCOUNTER — Other Ambulatory Visit: Payer: Self-pay | Admitting: Radiology

## 2016-04-22 ENCOUNTER — Other Ambulatory Visit: Payer: Self-pay | Admitting: Gastroenterology

## 2016-04-22 ENCOUNTER — Other Ambulatory Visit: Payer: Self-pay | Admitting: Endocrinology

## 2016-04-22 MED ORDER — LEVOTHYROXINE SODIUM 100 MCG PO TABS *I*
100.0000 ug | ORAL_TABLET | Freq: Every day | ORAL | 1 refills | Status: DC
Start: 2016-04-22 — End: 2016-08-26

## 2016-04-22 NOTE — Telephone Encounter (Signed)
Paul from [facility] is calling with a question on the medication levothyroxine (SYNTHROID, LEVOTHROID) 100 MCG tablet.   The question is: They need clarification on the directions there are two .?    Please return his call at [phone]  to advise.

## 2016-04-22 NOTE — Telephone Encounter (Signed)
Clarified dosing instructions per Dr. Michail SermonAzim. Janee MornErin Elim Peale, RN

## 2016-04-30 ENCOUNTER — Ambulatory Visit: Payer: PRIVATE HEALTH INSURANCE | Admitting: Internal Medicine

## 2016-04-30 ENCOUNTER — Ambulatory Visit: Payer: Self-pay | Admitting: Endocrinology

## 2016-05-05 ENCOUNTER — Ambulatory Visit
Admission: RE | Admit: 2016-05-05 | Discharge: 2016-05-05 | Disposition: A | Discharge status: Home / Self Care | Payer: PRIVATE HEALTH INSURANCE | Source: Ambulatory Visit

## 2016-05-05 ENCOUNTER — Ambulatory Visit
Admission: RE | Admit: 2016-05-05 | Discharge: 2016-05-05 | Disposition: A | Discharge status: Home / Self Care | Payer: PRIVATE HEALTH INSURANCE | Source: Ambulatory Visit | Attending: Internal Medicine | Admitting: Internal Medicine

## 2016-05-05 DIAGNOSIS — N6320 Unspecified lump in the left breast, unspecified quadrant: Secondary | ICD-10-CM | POA: Insufficient documentation

## 2016-05-05 DIAGNOSIS — N6321 Unspecified lump in the left breast, upper outer quadrant: Secondary | ICD-10-CM | POA: Insufficient documentation

## 2016-05-05 DIAGNOSIS — N6323 Unspecified lump in the left breast, lower outer quadrant: Secondary | ICD-10-CM

## 2016-05-05 DIAGNOSIS — N63 Unspecified lump in unspecified breast: Secondary | ICD-10-CM

## 2016-05-15 ENCOUNTER — Other Ambulatory Visit: Payer: Self-pay | Admitting: Nephrology

## 2016-05-15 DIAGNOSIS — D631 Anemia in chronic kidney disease: Secondary | ICD-10-CM

## 2016-05-15 DIAGNOSIS — N183 Chronic kidney disease, stage 3 unspecified: Secondary | ICD-10-CM

## 2016-05-19 ENCOUNTER — Other Ambulatory Visit
Admission: RE | Admit: 2016-05-19 | Discharge: 2016-05-19 | Disposition: A | Discharge status: Home / Self Care | Payer: PRIVATE HEALTH INSURANCE | Source: Ambulatory Visit | Attending: Nephrology | Admitting: Nephrology

## 2016-05-19 ENCOUNTER — Telehealth: Payer: Self-pay | Admitting: Nephrology

## 2016-05-19 DIAGNOSIS — N183 Chronic kidney disease, stage 3 unspecified: Secondary | ICD-10-CM

## 2016-05-19 DIAGNOSIS — D631 Anemia in chronic kidney disease: Secondary | ICD-10-CM | POA: Insufficient documentation

## 2016-05-19 LAB — CBC AND DIFFERENTIAL
Baso # K/uL: 0 10*3/uL (ref 0.0–0.1)
Basophil %: 0.2 %
Eos # K/uL: 0 10*3/uL (ref 0.0–0.4)
Eosinophil %: 0 %
Hematocrit: 34 % (ref 34–45)
Hemoglobin: 10.6 g/dL — ABNORMAL LOW (ref 11.2–15.7)
IMM Granulocytes #: 0 10*3/uL (ref 0.0–0.1)
IMM Granulocytes: 0.4 %
Lymph # K/uL: 2.2 10*3/uL (ref 1.2–3.7)
Lymphocyte %: 42.2 %
MCH: 22 pg — ABNORMAL LOW (ref 26–32)
MCHC: 31 g/dL — ABNORMAL LOW (ref 32–36)
MCV: 71 fL — ABNORMAL LOW (ref 79–95)
Mono # K/uL: 0.5 10*3/uL (ref 0.2–0.9)
Monocyte %: 9.6 %
Neut # K/uL: 2.5 10*3/uL (ref 1.6–6.1)
Nucl RBC # K/uL: 0 10*3/uL (ref 0.0–0.0)
Nucl RBC %: 0 /100 WBC (ref 0.0–0.2)
Platelets: 288 10*3/uL (ref 160–370)
RBC: 4.8 MIL/uL (ref 3.9–5.2)
RDW: 14.3 % (ref 11.7–14.4)
Seg Neut %: 47.6 %
WBC: 5.3 10*3/uL (ref 4.0–10.0)

## 2016-05-19 LAB — RENAL FUNCTION PANEL
Albumin: 3.9 g/dL (ref 3.5–5.2)
Anion Gap: 9 (ref 7–16)
CO2: 27 mmol/L (ref 20–28)
Calcium: 9.1 mg/dL (ref 8.6–10.2)
Chloride: 103 mmol/L (ref 96–108)
Creatinine: 2.23 mg/dL — ABNORMAL HIGH (ref 0.51–0.95)
GFR,Black: 28 * — AB
GFR,Caucasian: 24 * — AB
Glucose: 86 mg/dL (ref 60–99)
Lab: 36 mg/dL — ABNORMAL HIGH (ref 6–20)
Phosphorus: 3.7 mg/dL (ref 2.7–4.5)
Potassium: 5.1 mmol/L (ref 3.3–5.1)
Sodium: 139 mmol/L (ref 133–145)

## 2016-05-19 NOTE — Telephone Encounter (Signed)
Spoke to patient and reminded them to have labs completed at any [facility] lab prior to their appt on 3.23.18

## 2016-05-20 LAB — MICROALBUMIN, URINE, RANDOM
Creatinine,UR: 152 mg/dL (ref 20–300)
Microalb/Creat Ratio: 298.4 mg MA/g CR — ABNORMAL HIGH (ref 0.0–29.9)
Microalbumin,UR: 45.36 mg/dL

## 2016-05-22 ENCOUNTER — Ambulatory Visit: Payer: PRIVATE HEALTH INSURANCE | Attending: Nephrology | Admitting: Nephrology

## 2016-05-22 VITALS — BP 148/84 | HR 77 | Ht 76.0 in | Wt 328.0 lb

## 2016-05-22 DIAGNOSIS — I1310 Hypertensive heart and chronic kidney disease without heart failure, with stage 1 through stage 4 chronic kidney disease, or unspecified chronic kidney disease: Secondary | ICD-10-CM

## 2016-05-22 DIAGNOSIS — N184 Chronic kidney disease, stage 4 (severe): Secondary | ICD-10-CM

## 2016-05-22 DIAGNOSIS — N2581 Secondary hyperparathyroidism of renal origin: Secondary | ICD-10-CM

## 2016-05-22 DIAGNOSIS — E1121 Type 2 diabetes mellitus with diabetic nephropathy: Secondary | ICD-10-CM

## 2016-05-22 DIAGNOSIS — D631 Anemia in chronic kidney disease: Secondary | ICD-10-CM

## 2016-05-22 MED ORDER — FELODIPINE 5 MG PO TB24 *I*
5.0000 mg | ORAL_TABLET | Freq: Every day | ORAL | 5 refills | Status: AC
Start: 2016-05-22 — End: ?

## 2016-05-22 NOTE — Progress Notes (Signed)
Primary Care MD:  Windy Cannyarroll, Thomas, MD,PHD     I had the pleasure of seeing Anna Ford, a 51 y.o. 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    HPI:  I last saw Anna Ford 4 months ago for an initial consultation.  She missed a follow-up with me a month ago, and this is a reschedule of that appointment.  Over the past few months, Robynn Panelise has not undergone stress test for to enable transplant eval.  It appears that the test was never scheduled.  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  No recent hospitalizations or new medical problems.  No stress test scheduled.   B.G. control improved from last fall.  Notes worsened dysautonomia symptoms though despite discontinuing coreg.  Notes stress at her job and looking for other options.      Current Outpatient Prescriptions   Medication Sig    levothyroxine (SYNTHROID, LEVOTHROID) 100 MCG tablet Take 1 tablet (100 mcg total) by mouth daily (before breakfast)   Take 1 100 mcg tablet by mouth on Saturdays and Sundays.    lisinopril (PRINIVIL,ZESTRIL) 20 MG tablet Take 1 tablet (20 mg total) by mouth daily    insulin aspart (NOVOLOG) 100 UNIT/ML injection vial Maximum 150 Units/day    blood glucose (ONETOUCH VERIO) test strip Test 4 times a day.   Brand name of stripes verio    insulin glargine (LANTUS) 100 UNIT/ML injection vial Inject 70 Units into the skin nightly    furosemide (LASIX) 40 MG tablet Take 1 tablet (40 mg total) by mouth 2 times daily    escitalopram (LEXAPRO) 20 MG tablet Take 1 tablet (20 mg total) by mouth daily    rosuvastatin (CRESTOR) 40 MG tablet Take 1 tablet (40 mg total) by mouth daily (with dinner)    levothyroxine (SYNTHROID, LEVOTHROID) 125 MCG tablet Take 2 tablets (250 mcg total) by mouth daily (before breakfast)    felodipine (PLENDIL) 2.5 MG 24 hr tablet Take 1 tablet (2.5 mg total) by mouth daily   Swallow whole. Do not crush, break, or chew.    other supply Dispense CPAP supplies and  accessories    Cholecalciferol (VITAMIN D-3 PO) Take by mouth    aspirin 81 MG tablet Take 81 mg by mouth daily    cetirizine (ZYRTEC) 10 MG tablet Take 10 mg by mouth daily    fluticasone (FLONASE) 50 MCG/ACT nasal spray 1 spray by Nasal route daily    montelukast (SINGULAIR) 10 MG tablet Take 10 mg by mouth nightly     No current facility-administered medications for this visit.        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    Syncope and collapse R55    Morbid obesity E66.01    Heart failure with preserved ejection fraction I50.30        PHYSICAL EXAM:    Constitutional: Well developed, well nourished, in no apparent distress  BP 148/84   Pulse 77   Ht 1.93 m (6\' 4" )   Wt (!) 148.8 kg (328 lb)   BMI 39.93 kg/m2  Unchanged on  re-check  EYES:  Conjunctivae pale; no scleral icterus; pupils symmetric; gaze is conjugate   ENT: External appearance of ears and nose are normal;  Mucus membranes are moist; Oral Pharynx clear;  RESPIRATORY: Normal respiratory effort and diaphragmatic excursion; Clear to ausculation  CARDIAC:  heart is regular without G/R/M; extremities perfused; no peripheral edema;   GASTROINTESTINAL: The abdomen is soft, non-tender, non-distended, obese  MUSCULOSKELETAL:  No hot, tender, or red joints; right BKA noted   SKIN:  No rashes or ulcers on visible skin  Neuro: Alert, lucid, and oriented x 3; Affect is appropriate; Memory appears to be intact    Lab Results:      Component Value Date/Time    NA 139 05/19/2016 1412    K 5.1 05/19/2016 1412    CL 103 05/19/2016 1412    CO2 27 05/19/2016 1412    UN 36 (H) 05/19/2016 1412    CREAT 2.23 (H) 05/19/2016 1412    GFRC 24 (!) 05/19/2016 1412    GFRB 28  (!) 05/19/2016 1412    GLU 86 05/19/2016 1412    HA1C 10.6 (H) 11/05/2015 1445    CA 9.1 05/19/2016 1412    PO4 3.7 05/19/2016 1412    PTH 118.9 (H) 04/09/2016 1121    VID25 46 04/09/2016 1121    UTPR 75 04/09/2016 1204    UCRR 152 05/19/2016 1412    MALBR 45.36 05/19/2016 1412          Component Value Date/Time    WBC 5.3 05/19/2016 1412    HGB 10.6 (L) 05/19/2016 1412    HCT 34 05/19/2016 1412    PLT 288 05/19/2016 1412    FE 46 04/09/2016 1121    IBC 252 04/09/2016 1121    FESAT 18 04/09/2016 1121    FER 310 (H) 04/09/2016 1121          Assessment:    [name] is a 51 y.o. 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  Creatinine stable, no need to prepare for dialysis, but hypertension is not controlled.    Plan:    1. CKD stage IV: Renal function is stable.  Risk factors for progression are controlled.  Plan continued monitoring every few months.    2. Hypertension: Blood pressure is not controlled on current regimen.  Increase felodipine to 5 mg q hs.      3. Proteinuria: On angiotension blockade. No changes.    4. Anemia: Hb is acceptable. No need for EPOGEN or intravenous iron at this time.    5. CKD BMD: Calcium, Phos, vitamin D and PTH are acceptable. Will continue to monitor these periodically.     6. Hyperkalemia:  Controlled with current measures.    Return to clinic in 4 months with labs prior to the next visit.     Author: Mickel Crow, MD  Note created: 05/22/2016  at: 11:22 AM

## 2016-05-29 ENCOUNTER — Encounter: Payer: Self-pay | Admitting: Emergency Medicine

## 2016-05-29 ENCOUNTER — Emergency Department: Payer: PRIVATE HEALTH INSURANCE

## 2016-05-29 ENCOUNTER — Other Ambulatory Visit: Payer: Self-pay | Admitting: Cardiology

## 2016-05-29 ENCOUNTER — Emergency Department
Admission: EM | Admit: 2016-05-29 | Discharge: 2016-05-30 | Disposition: A | Discharge status: Home / Self Care | Payer: PRIVATE HEALTH INSURANCE | Source: Ambulatory Visit | Attending: Emergency Medicine | Admitting: Emergency Medicine

## 2016-05-29 DIAGNOSIS — R002 Palpitations: Secondary | ICD-10-CM

## 2016-05-29 DIAGNOSIS — R202 Paresthesia of skin: Secondary | ICD-10-CM | POA: Insufficient documentation

## 2016-05-29 DIAGNOSIS — R079 Chest pain, unspecified: Secondary | ICD-10-CM

## 2016-05-29 DIAGNOSIS — I10 Essential (primary) hypertension: Secondary | ICD-10-CM | POA: Insufficient documentation

## 2016-05-29 DIAGNOSIS — R0789 Other chest pain: Secondary | ICD-10-CM

## 2016-05-29 DIAGNOSIS — R9431 Abnormal electrocardiogram [ECG] [EKG]: Secondary | ICD-10-CM

## 2016-05-29 DIAGNOSIS — E1065 Type 1 diabetes mellitus with hyperglycemia: Secondary | ICD-10-CM | POA: Insufficient documentation

## 2016-05-29 DIAGNOSIS — Z794 Long term (current) use of insulin: Secondary | ICD-10-CM | POA: Insufficient documentation

## 2016-05-29 LAB — BASIC METABOLIC PANEL
Anion Gap: 13 (ref 7–16)
CO2: 24 mmol/L (ref 20–28)
Calcium: 8.4 mg/dL — ABNORMAL LOW (ref 8.6–10.2)
Chloride: 96 mmol/L (ref 96–108)
Creatinine: 2.63 mg/dL — ABNORMAL HIGH (ref 0.51–0.95)
GFR,Black: 23 * — AB
GFR,Caucasian: 20 * — AB
Glucose: 553 mg/dL (ref 60–99)
Lab: 48 mg/dL — ABNORMAL HIGH (ref 6–20)
Potassium: 4.9 mmol/L (ref 3.3–5.1)
Sodium: 133 mmol/L (ref 133–145)

## 2016-05-29 LAB — POCT GLUCOSE
Glucose POCT: 493 mg/dL — ABNORMAL HIGH (ref 60–99)
Glucose POCT: 461 mg/dL — ABNORMAL HIGH (ref 60–99)
Glucose POCT: 359 mg/dL — ABNORMAL HIGH (ref 60–99)
Glucose POCT: 186 mg/dL — ABNORMAL HIGH (ref 60–99)

## 2016-05-29 LAB — BETA HYDROXYBUTYRATE: Beta Hydroxybutyrate: 0.1 mmol/L (ref 0.02–0.27)

## 2016-05-29 LAB — CBC AND DIFFERENTIAL
Baso # K/uL: 0 10*3/uL (ref 0.0–0.1)
Basophil %: 0 %
Eos # K/uL: 0 10*3/uL (ref 0.0–0.4)
Eosinophil %: 0 %
Hematocrit: 33 % — ABNORMAL LOW (ref 34–45)
Hemoglobin: 10.1 g/dL — ABNORMAL LOW (ref 11.2–15.7)
IMM Granulocytes #: 0 10*3/uL (ref 0.0–0.1)
IMM Granulocytes: 0.5 %
Lymph # K/uL: 1.8 10*3/uL (ref 1.2–3.7)
Lymphocyte %: 30.5 %
MCH: 21 pg — ABNORMAL LOW (ref 26–32)
MCHC: 31 g/dL — ABNORMAL LOW (ref 32–36)
MCV: 69 fL — ABNORMAL LOW (ref 79–95)
Mono # K/uL: 0.4 10*3/uL (ref 0.2–0.9)
Monocyte %: 7.3 %
Neut # K/uL: 3.6 10*3/uL (ref 1.6–6.1)
Nucl RBC # K/uL: 0 10*3/uL (ref 0.0–0.0)
Nucl RBC %: 0 /100 WBC (ref 0.0–0.2)
Platelets: 311 10*3/uL (ref 160–370)
RBC: 4.8 MIL/uL (ref 3.9–5.2)
RDW: 14 % (ref 11.7–14.4)
Seg Neut %: 61.7 %
WBC: 5.9 10*3/uL (ref 4.0–10.0)

## 2016-05-29 LAB — TROPONIN T: Troponin T: <0.01 ng/mL (ref 0.00–0.02)

## 2016-05-29 MED ORDER — LISINOPRIL 20 MG PO TABS *I*
20.0000 mg | ORAL_TABLET | Freq: Once | ORAL | Status: AC
Start: 2016-05-29 — End: 2016-05-29
  Administered 2016-05-29: 20 mg via ORAL
  Filled 2016-05-29: qty 1

## 2016-05-29 MED ORDER — SODIUM CHLORIDE 0.9 % IV BOLUS *I*
1000.0000 mL | Freq: Once | Status: AC
Start: 2016-05-29 — End: 2016-05-29
  Administered 2016-05-29: 1000 mL via INTRAVENOUS

## 2016-05-29 MED ORDER — FUROSEMIDE 40 MG PO TABS *I*
40.0000 mg | ORAL_TABLET | Freq: Once | ORAL | Status: AC
Start: 2016-05-29 — End: 2016-05-29
  Administered 2016-05-29: 40 mg via ORAL
  Filled 2016-05-29: qty 1

## 2016-05-29 NOTE — ED Triage Notes (Signed)
Pt reports that last night she was having RIGHT sided facial and arm numbness. She is an Research scientist (medical), she admits to a stress full week with yesterday being the worst day. She stated that overnight she check her BG and it was, she has an insulin pump and dosed her self. She stated that she felt as though she was having palpitations. And also reports heart burn. No facial  droop noted, equal strengths on upper extremities at triage.        Triage Note   Dwain Sarna, RN

## 2016-05-29 NOTE — ED Provider Notes (Signed)
History     Chief Complaint   Patient presents with    Numbness    Chest Pain    Heartburn     HPI Comments: Patient is a 51 year old female with history of hypertension, hypothyroidism, diabetes, and chronic kidney disease who comes into the ED with chest pressure, palpitations, and tingling.  States over the past 48 hours she's been under a lot of stress.  Last night she palpitations checked her blood sugar was over 400.  She corrected it with her insulin.  This morning she woke up and felt like she was hypoglycemic because she was shaky.  Later on the day she again had palpitations, right-sided chest pain, and right-sided facial tingling.  Denies any numbness or weakness.  She is currently asymptomatic.  No recent fevers, chills, cough, congestion, or urinary symptoms.      History provided by:  Patient  Language interpreter used: No      Past Medical History:   Diagnosis Date    Autonomic dysreflexia     CKD (chronic kidney disease) stage 4, GFR 15-29 ml/min     Depression     DM type 1 (diabetes mellitus, type 1)     HLD (hyperlipidemia)     Hypertension     Morbid obesity with BMI of 40.0-44.9, adult     OSA on CPAP     Osteomyelitis     SVT (supraventricular tachycardia)     s/p ablation in 2002    Syncope and collapse 01/2016    x2     Thyroid disease     hypothyroid        Past Surgical History:   Procedure Laterality Date    BREAST BIOPSY Right 2000    neg. exc. bx    cataracts Bilateral     left foot amputation Left     broke foot, never healed    LEG AMPUTATION BELOW KNEE Right     THYROIDECTOMY       Family History   Problem Relation Age of Onset    Heart Murmur Mother     Heart Disease Mother      mitral valve prolapse-reported from pt    Breast cancer Maternal Aunt        Social History    reports that she has never smoked. She has never used smokeless tobacco. She reports that she drinks alcohol. She reports that she does not use illicit drugs. Her sexual activity history  is not on file.    Living Situation     Questions Responses    Patient lives with     Comment: staying with her aunt     Homeless     Caregiver for other family member     External Services     Employment Employed    Comment: Social research officer, government at [employer] Violence Risk           Problem List     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   Psychiatric/Behavioral: The patient is nervous/anxious.        Physical Exam     ED Triage Vitals   BP Heart Rate Heart Rate (via Pulse Ox) Resp Temp Temp src SpO2 (Retired) O2 Device O2 Flow Rate   05/29/16 1826 05/29/16 1900 05/29/16 1826 05/29/16 1826 05/29/16 1826 05/29/16 1826 05/29/16 1826 -- --   194/86 88 92 16 36.2 C (97.2 F) TEMPORAL 99 %        Weight           05/29/16 1826           148.8 kg (328 lb)                    Physical Exam   Constitutional: She is oriented to person, place, and time. Vital signs are normal. She appears well-developed. No distress.   HENT:   Head: Normocephalic and atraumatic.   Eyes: EOM are normal. Pupils are equal, round, and reactive to light.   Neck: Normal range of motion. Neck supple.   Cardiovascular: Normal rate, regular rhythm and normal heart sounds.    Pulmonary/Chest: Effort normal and breath sounds normal.   Abdominal: Soft. Bowel sounds are normal.   Musculoskeletal: Normal range of motion.   Neurological: She is alert and oriented to person, place, and time. She has normal strength. She is not disoriented. No  cranial nerve deficit or sensory deficit. GCS eye subscore is 4. GCS verbal subscore is 5. GCS motor subscore is 6.   Skin: Skin is warm and dry.   Psychiatric: She has a normal mood and affect. Her behavior is normal.   Nursing note and vitals reviewed.      Medical Decision Making        Initial Evaluation:  ED First Provider Contact     Date/Time Event User Comments    05/29/16 1938 ED First Provider Contact Rogelia Boga Initial Face to Face Provider Contact          Patient seen by me on arrival date of 05/29/2016 at at time of arrival  6:19 PM.  Initial face to face evaluation time noted above may be discrepant due to patient acuity and delay in documentation.    Assessment:  51 y.o.female comes to the ED with Palpitations, hyperglycemia, and CP    Differential Diagnosis includes anxiety, hyperglycemia, ACS, GERD,DKA                      Plan:   Ekg/tele  Chest xray  Labs  IVF  *Chest STANDARD single view   Final Result       No acute pulmonary disease.      END OF REPORT      [facility] submits this DICOM format image data and final report to the [facility], an independent secure electronic health information exchange, on a reciprocally searchable basis (with patient authorization) for a minimum of 12 months after exam    date.           BG Improved due to patient giving herself her own insulin PTA.   Repeat troponin done due to her multiple risk factors- both negative    Patient remained asymtopmatic in ED, BP elevated but she has been having her medications adjusted recently due to it being  elevated. States she will call her pcp/nephrologist Monday and continue to monitor her BP over the weekend. Patient feels comfortable going home   [facility], [place]    Supervising physician Rada Hay was immediately available       Johna Sheriff, [place]  05/30/16 [phone]

## 2016-05-29 NOTE — ED Notes (Addendum)
Pt. Was informed of glucose level and she self-corrected on insulin pump. Will continue to monitor BG.

## 2016-05-30 LAB — HOLD LAVENDER

## 2016-05-30 LAB — CK ISOENZYMES
CK: 295 U/L — ABNORMAL HIGH (ref 34–145)
Mass CKMB: 4.8 ng/mL (ref 0.0–5.3)
Relative Index: 1.6 % (ref 0.0–5.0)

## 2016-05-30 LAB — POCT GLUCOSE: Glucose POCT: 129 mg/dL — ABNORMAL HIGH (ref 60–99)

## 2016-05-30 LAB — HOLD SST

## 2016-05-30 LAB — TROPONIN T: Troponin T: <0.01 ng/mL (ref 0.00–0.02)

## 2016-05-30 LAB — HOLD GREEN WITH GEL

## 2016-06-01 ENCOUNTER — Telehealth: Payer: Self-pay

## 2016-06-03 ENCOUNTER — Ambulatory Visit: Payer: PRIVATE HEALTH INSURANCE | Admitting: Endocrinology

## 2016-06-03 LAB — EKG 12-LEAD
P: 45 deg
PR: 192 ms
QRS: 8 deg
QRSD: 98 ms
QT: 384 ms
QTc: 462 ms
Rate: 87 {beats}/min
Severity: ABNORMAL
T: 20 deg

## 2016-06-08 ENCOUNTER — Ambulatory Visit: Payer: PRIVATE HEALTH INSURANCE | Attending: Internal Medicine

## 2016-06-08 DIAGNOSIS — Z89511 Acquired absence of right leg below knee: Secondary | ICD-10-CM | POA: Insufficient documentation

## 2016-06-08 DIAGNOSIS — E109 Type 1 diabetes mellitus without complications: Secondary | ICD-10-CM | POA: Insufficient documentation

## 2016-06-09 NOTE — Telephone Encounter (Signed)
CLOSE

## 2016-06-09 NOTE — Progress Notes (Signed)
RT BK Prosthesis Supplies  Delivery Encounter    Patient Name: Anna Ford     Date: 06/09/2016    Subjective:  Changes since last visit:   Supplies are getting severely dilapidated   Rotation on HD Harmony does not seem to be working       ICD-10-CM ICD-9-CM   1. S/P BKA (below knee amputation), right Z89.511 V49.75   2. Type I diabetes mellitus E10.9 250.01       Objective:     Her current sleeve has multiple holes and inside/outside layers of sleeve are separating   Her current sheath is filthy, with blood stains under anterior distal tibia   She has gauze over anterior distal tibia still   HD unit rotation is not functioning appropriately (very loose)  Items Delivered:   The following items were delivered/provided today:      Device  Side: Right  Size:: 3  Model:: Derma Proflex Sleeve  Manufacturer: [employer]  Part Number: [id number]  Warranty:: 90 Days  Quantity: 1  Status: Delivered  Component 2  Side:: Right  Size:: Regular Short  Model:: Single Ply Socks  Manufacturer: [employer]  Part Number: [id number]  Warranty:: 90 Days  Quantity: 3  Status: Delivered  Liner  Side:: Right  Size:: 32  Model:: HP EZ Liner  Manufacturer: [employer]  Part Number: [id number]  Warranty:: 90 Days  Quantity: 2  Status: Delivered  Socks  Socks: Yes  Side:: Right  Size:: Short  Model:: Silver Sheath  Manufacturer: [employer]  Part Number: [id number]  Warranty:: 90 Days  Quantity: 3  Status: Delivered  Other  Side:: Right  Size:: Regular X-Small  Model:: 3 ply sock  Manufacturer: [employer]  Part Number: [id number]  Warranty:: 90 Days  Quantity: 6  Status: Delivered    Assessment:    She initially was not getting vacuum and the vacuum hole in the inner liner and socket were blocked and cleared open today  Barb fittings were clogged and were opened up  Vacuum function was checked and confirmed  Compression rod in distal HD Harmony was compressed and then fully released  This made very little difference in functionality  I will have  to look into replacement/servicing as well as a loaner unit (She reported that the age of the prosthesis is ~53 years old)    Patient will benefit by use of device fit today by:  Using supplies as discussed today (6ply fit)     Device fit and function are appropriate     Device was inspected for safety and security and found to be functioning properly    The patient given verbal and written instructions.  The following Instructions were reviewed:   Purpose of device   Cleaning / care of device   Potential Risks / Benefits   Frequency / Duration of use   How to report potential failure / malfunctions    Plan:  Return for routine follow up in 3 week(s) or as needed    Patient should return before scheduled appointment if:   Patient experiences excessive discomfort after a gradual break-in process   The device breaks or seems unstable   Redness lasts more than 20 minutes after wearing   Patient is experiencing difficulty wearing as prescribed

## 2016-06-10 ENCOUNTER — Ambulatory Visit: Payer: Self-pay | Admitting: Internal Medicine

## 2016-06-23 ENCOUNTER — Ambulatory Visit: Payer: PRIVATE HEALTH INSURANCE

## 2016-06-23 DIAGNOSIS — Z89511 Acquired absence of right leg below knee: Secondary | ICD-10-CM

## 2016-06-23 DIAGNOSIS — E109 Type 1 diabetes mellitus without complications: Secondary | ICD-10-CM

## 2016-06-23 NOTE — Progress Notes (Signed)
RT Transtibial Prosthesis  Follow-up (Loaner HD Leonides Grills)     Subjective:  Changes since last visit:   Doing well with supplies provided at last visit    Objective:     She came in today ambulating independently with her standard cane           Adjustments needed in order to use:   Her HD unity (S/N [id number]) was removed and the loaner (S/N [id number]) was installed   Screws were loctited and torqued per manufacturers requirements   Torsion bumper was fully threaded then unthreaded 1 full turn                  Functional Assessment of Device   She presented with a smooth heel toe gait.   She was pleased with today's results    Assessment:   Adjustments made have restored device to full functionality.      Device fits appropriately and should be worn as prescribed.     Device was inspected for safety/security and found to be functioning properly    Plan:        Patient should return before scheduled appointment if:   Patient experiences excessive discomfort after a gradual break-in process   The device breaks or seems unstable   Redness lasts more than 20 minutes after wearing   Patient is experiencing difficulty wearing as prescribed

## 2016-06-24 ENCOUNTER — Encounter: Payer: Self-pay | Admitting: Gastroenterology

## 2016-06-24 ENCOUNTER — Telehealth: Payer: Self-pay

## 2016-06-24 ENCOUNTER — Ambulatory Visit: Payer: PRIVATE HEALTH INSURANCE | Attending: Internal Medicine | Admitting: Internal Medicine

## 2016-06-24 VITALS — BP 150/70 | HR 84 | Ht 76.0 in | Wt 332.0 lb

## 2016-06-24 DIAGNOSIS — G904 Autonomic dysreflexia: Secondary | ICD-10-CM

## 2016-06-24 DIAGNOSIS — E109 Type 1 diabetes mellitus without complications: Secondary | ICD-10-CM

## 2016-06-24 DIAGNOSIS — I10 Essential (primary) hypertension: Secondary | ICD-10-CM

## 2016-06-24 DIAGNOSIS — Z89511 Acquired absence of right leg below knee: Secondary | ICD-10-CM

## 2016-06-24 LAB — POCT HEMOGLOBIN A1C: Hemoglobin A1C,POC: 11.7 % (ref 4.0–6.0)

## 2016-06-24 NOTE — Telephone Encounter (Signed)
Text exchange shared with patient, Sacha relays her intention to attend this afternoon's appointment.

## 2016-06-24 NOTE — Progress Notes (Signed)
[facility] Academic Practice Note      Chief Complaint: follow up    HPI:  Anna Ford is a 51 y.o. female with PMH significant for DMT1, CKD IV, HFpEF,HTN, hypothyroidism who presents for post-ED follow up.     Dysautonomia/HTN:   While on a trip to [place] 1-2 weeks ago had two episodes of lightheadedness. The first episodes only lasted a minute. Later in the day she got lightheaded and diaphoretic and it did not pass, so ended up going to [facility] ED. Reports SBP was in the 90s and she got fluids and SBP improved to 110 and sx resolved. She was discharged from ED same day. Reports no other sx - no cold sx, no change in appetite or PO fluid intake.     Also reports two instances prior to that with very high blood pressure. One time went to [facility] ED because she was having CP and tingling. And once while seeing Dr. Grayling Congress. Says at that time he increased her felodipine to 5 mg.     DM: Has been doing OK. BGs variable, sometimes low to 70s other times high to 425, but usually in the mid-100s. On insulin pump. Missed her last 2 appointment with endocrine.     Denies CP, abd pain, palpitation.     Patient's allergies, past medical, surgical, social and family histories were reviewed, updated. Previsit worksheet reviewed: Yes    Medications:   Current Outpatient Prescriptions   Medication Sig    felodipine (PLENDIL) 5 MG 24 hr tablet Take 1 tablet (5 mg total) by mouth daily   Swallow whole. Do not crush, break, or chew.    levothyroxine (SYNTHROID, LEVOTHROID) 100 MCG tablet Take 1 tablet (100 mcg total) by mouth daily (before breakfast)   Take 1 100 mcg tablet by mouth on Saturdays and Sundays.    lisinopril (PRINIVIL,ZESTRIL) 20 MG tablet Take 1 tablet (20 mg total) by mouth daily    insulin aspart (NOVOLOG) 100 UNIT/ML injection vial Maximum 150 Units/day    blood glucose (ONETOUCH VERIO) test strip Test 4 times a day.   Brand name of stripes verio    insulin glargine (LANTUS) 100 UNIT/ML injection  vial Inject 70 Units into the skin nightly    furosemide (LASIX) 40 MG tablet Take 1 tablet (40 mg total) by mouth 2 times daily    escitalopram (LEXAPRO) 20 MG tablet Take 1 tablet (20 mg total) by mouth daily    rosuvastatin (CRESTOR) 40 MG tablet Take 1 tablet (40 mg total) by mouth daily (with dinner)    levothyroxine (SYNTHROID, LEVOTHROID) 125 MCG tablet Take 2 tablets (250 mcg total) by mouth daily (before breakfast)    other supply Dispense CPAP supplies and accessories    Cholecalciferol (VITAMIN D-3 PO) Take by mouth    aspirin 81 MG tablet Take 81 mg by mouth daily    cetirizine (ZYRTEC) 10 MG tablet Take 10 mg by mouth daily    fluticasone (FLONASE) 50 MCG/ACT nasal spray 1 spray by Nasal route daily    montelukast (SINGULAIR) 10 MG tablet Take 10 mg by mouth nightly     No current facility-administered medications for this visit.         Review of Systems - negative except as above.     Exam:   Vitals:    06/24/16 1433   BP: 150/70   BP Location: Left arm   Patient Position: Sitting   Cuff Size: large adult   Pulse:  84   Weight: (!) 150.6 kg (332 lb 0.6 oz)   Height: 1.93 m ( )     Repeat BP while standing 156/84.     General: Well appearing, pleasant   HEENT: NCAT, MMM, anicteric  Cardiac: RRR no m/r/g  Resp: CTAB no crackles or wheeze  Ext: Warm. Radial pulses present and equal. Right BKA, no LEE on the L.   Neuro: Awake, alert, oriented. Face symmetric. Speech and comprehension in tact. Moving all extremities.   Skin: No rashes or lesions noted on exposed skin.     Labs:  Reviewed and notable for:  - POCT Hgb A1c 11.7    ______________________________________________________________________  Diagnoses and all orders for this visit:    HTN (hypertension)/Autonomic dysreflexia: BP elevated today, however, also with continued symptoms of orthostatic hypotension. Not orthostatic today when nursing checked standing BP. Will continue current therapy with felodipine, lisinopril, and   -      Ambulatory BP Monitoring; Future  -     POCT Hemoglobin A1C    Type 1 diabetes mellitus: Hgb A1c 11.7 up from prior of 10.6. She has missed multiple follow up appointments with endocrine. She is on pump insulin. I reminded her that we do not prescribe pump insulin, and that if she does not follow up with endocrine, we will have to transition her off pump insulin for safety reasons. She says she will call and schedule an appointment with endocrine. Can discuss referral to DM educators at next visit.     CKD IV/Anemia of Chronic disease: Cr 2.63 at last check in March, at baseline. Following with nephrology/Dr Wing. K stable. Anemia stable, no need for epo. BP poorly controlled but complicated by episodes of hypotension related to autonomic dysreflexia. BGs poorly controlled, encouraging follow up with endocrine for modification of pump insulin regimen.     Handicap parking paperwork completed.     Follow up in 2 months, or sooner if needed for acute symptoms.     Gwynneth Aliment, MD 06/26/2016 at 12:26 PM   PGY3 Internal Medicine  Pager: [phone]

## 2016-06-25 ENCOUNTER — Telehealth: Payer: Self-pay

## 2016-06-25 NOTE — Telephone Encounter (Signed)
LMOM to schedule an ABP ordered by Dr. Lennette Bihari.

## 2016-06-25 NOTE — Telephone Encounter (Signed)
scheduled

## 2016-06-26 ENCOUNTER — Ambulatory Visit
Admission: RE | Admit: 2016-06-26 | Discharge: 2016-06-26 | Disposition: A | Discharge status: Home / Self Care | Payer: PRIVATE HEALTH INSURANCE | Source: Ambulatory Visit | Attending: Cardiology | Admitting: Cardiology

## 2016-06-26 DIAGNOSIS — I10 Essential (primary) hypertension: Secondary | ICD-10-CM | POA: Insufficient documentation

## 2016-06-26 DIAGNOSIS — E109 Type 1 diabetes mellitus without complications: Secondary | ICD-10-CM | POA: Insufficient documentation

## 2016-06-26 DIAGNOSIS — Z89511 Acquired absence of right leg below knee: Secondary | ICD-10-CM | POA: Insufficient documentation

## 2016-06-26 DIAGNOSIS — G904 Autonomic dysreflexia: Secondary | ICD-10-CM | POA: Insufficient documentation

## 2016-06-29 ENCOUNTER — Encounter: Payer: Self-pay | Admitting: Endocrinology

## 2016-06-29 ENCOUNTER — Ambulatory Visit: Payer: PRIVATE HEALTH INSURANCE | Attending: Endocrinology | Admitting: Endocrinology

## 2016-06-29 VITALS — BP 136/71 | HR 86 | Ht 76.0 in | Wt 321.0 lb

## 2016-06-29 DIAGNOSIS — E1065 Type 1 diabetes mellitus with hyperglycemia: Secondary | ICD-10-CM

## 2016-06-29 DIAGNOSIS — IMO0002 Reserved for concepts with insufficient information to code with codable children: Secondary | ICD-10-CM

## 2016-06-29 NOTE — Patient Instructions (Signed)
Be sure check blood glucose and carbs so you can enter into the pump  Make an appointment with diabetic educator and Opthalmology  Revisit in 6 weeks

## 2016-06-29 NOTE — Progress Notes (Signed)
Anna Ford is a 51 y.o. female  with history of type1 DM complicated with retinopathy, neuropathy and nephropathy and initially diagnosed at age 23. Pt recently moved from [place] in August 2017. Pt was born and rasied in [place], [place]. At the time of diagnosis she had symptoms of excessive thirst and urination. Medications tried include long acting insulin and for past 10 years on insulin pump. Pt was admitted May 29, 2016 to [facility] for presyncope and dizziness  (negative troponins)- the night before her BG was ~ 400 mg/dL.  Patient has a past medical history of HTN, HLD, s/p left BKA, autonomic dysreflexia, OSA compliant with CPAP, CKD III, Hashimoto's thyroiditis s/p thyroidectomy, SVT s/p ablation in [place] in 2002. She presents today for management of uncontrolled type 1 DM on insulin pump.    Says that she needs to be better with accountability - has lived several years with uncontrolled DM1.   Currently lives with her with aunt in [place]. 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  -- Self-glucose monitoring: yes - 3-4 times a day meals, various times or will check if she feels low  -- Hyperglycemia: yes  -- Hypoglycemia: denies confusion, dizziness, headache, seizures, sweating, nightmares and LOC.  Hypoglycemia unawareness: yes, starts to feel low  70, was 62, blacked out when she was driving 1610 vision was off and felt  confused.  -- Endocrine: denies polyuria, polydipsia.  -- CV: denies chest pain, shortness of breath.  --Extremities:     -- Hands: denies tingling,numbness     -- Feet: + tingling, numbness left, right BKA  -- Eyes: denies history of laser treatment, double/blurry vision, retinopathy, cataracts, glaucoma, Last eye exam: > 1 year, ref to [facility].  -- Kidney: CKD stage 4.  -- Diet: watches carbohydrates, eats dinner late around 8 pm  , one snack bewteen L & D friut, cracker. When she goes out is not sure she counts correctly  -- Exercise: none    Current Outpatient Prescriptions   Medication Sig    felodipine (PLENDIL) 5 MG 24 hr tablet Take 1 tablet (5 mg total) by mouth daily   Swallow whole. Do not crush, break, or chew.    levothyroxine (SYNTHROID, LEVOTHROID) 100 MCG tablet Take 1 tablet (100 mcg total) by mouth daily (before breakfast)   Take 1 100 mcg tablet by mouth on Saturdays and Sundays.    lisinopril (PRINIVIL,ZESTRIL) 20 MG tablet Take 1 tablet (20 mg total) by mouth daily    insulin aspart (NOVOLOG) 100 UNIT/ML injection vial Maximum 150 Units/day    blood glucose (ONETOUCH VERIO) test strip Test 4 times a day.   Brand name of stripes verio    insulin glargine (LANTUS) 100 UNIT/ML injection vial Inject 70 Units into the skin nightly    furosemide (LASIX) 40 MG  tablet Take 1 tablet (40 mg total) by mouth 2 times daily    escitalopram (LEXAPRO) 20 MG tablet Take 1 tablet (20 mg total) by mouth daily    rosuvastatin (CRESTOR) 40 MG tablet Take 1 tablet (40 mg total) by mouth daily (with dinner)    levothyroxine (SYNTHROID, LEVOTHROID) 125 MCG tablet Take 2 tablets (250 mcg total) by mouth daily (before breakfast)    other supply Dispense CPAP supplies and accessories    Cholecalciferol (VITAMIN D-3 PO) Take by mouth    aspirin 81 MG tablet Take 81 mg by mouth daily    cetirizine (ZYRTEC) 10 MG tablet Take 10 mg by mouth daily    fluticasone (FLONASE) 50 MCG/ACT nasal spray 1 spray by Nasal  route daily    montelukast (SINGULAIR) 10 MG tablet Take 10 mg by mouth nightly       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    Syncope and collapse R55    Morbid obesity E66.01    Heart failure with preserved ejection fraction I50.30       Patient's medications, allergies and problems were reviewed and updated as appropriate.    Physical Exam:   BP 136/71   Pulse 86   Ht 1.93 m ( )   Wt (!) 145.6 kg (321 lb)   BMI 39.07 kg/m2  GENERAL APPEARANCE: well developed, well nourished, no acute distress; aware and alert  HEENT: PERLA, EOMI, no lid lag, pink conjunctiva   NECK: supple, no thyromegaly, no lymphadenopathy  HEART: RRR, normal S1, S2, no MRGT, no JVD   CHEST: lungs clear to auscultation bilaterally  EXTREMITIES: no clubbing, cyanosis, or edema. Right BKA with prosthesis, left - s/p metatarsal amputation- well healed, left shoulder soft mass ~ 5 cm ? lipoma  NEUROLOGICAL: Alert and oriented x 3.   SKIN:  Numerous flat hyperpigmented scars bilateral upper extremeties.    Lab Review  Lab Results   Component Value Date/Time    NA 133 05/29/2016 07:08 PM    K 4.9 05/29/2016 07:08 PM    CL 96 05/29/2016 07:08 PM    UN 48 (H) 05/29/2016 07:08 PM    CREAT 2.63 (H) 05/29/2016 07:08 PM    MALBR 45.36 05/19/2016 02:12 PM     Lab Results   Component Value Date/Time    CHOL 147 12/31/2015 05:21 AM    TRIG 140 12/31/2015 05:21 AM    HDL 49 12/31/2015 05:21 AM    LDLC 70 12/31/2015 05:21 AM     Lab Results   Component Value Date/Time    HA1C 10.6 (H) 11/05/2015 02:45 PM     No results found for: IGA, TGLUA  Lab Results   Component Value Date/Time    TSH 0.01 (L) 04/09/2016 11:21 AM     Hemoglobin A1C   Date Value Ref Range  Status   11/05/2015 10.6 (H) 4.0 - 6.0 % Final     Comment:  ELEVATED     Hemoglobin A1C,POC   Date Value Ref Range Status   06/24/2016 11.7 4.0 - 6.0 % Final   01/31/2016 9.2 (H) 4.0 - 6.0 % Final       Assessment:      Rickesha Veracruz is a 51 y.o. female with uncontrolled type 1 diabetes mellitus. She has complex complications - CKC, retinopathy, neuropathy, bilateral LE amputations, CVD      Plan:      TESTS: A1C    TREATMENT: Maintain pump setting, encouraged to use bolus wizard  Using One touch verio flex BG meter  Received 670 pump, but it is in storage, never got sensor, it was sent to [facility] her old address in [facility].  Referred to Opthalmology  Referred to CDE for carb counting and to address non-adherence problems    RETURN IN: 6 weeks    Thank you for allowing me to participate in the care of this patient.  Please feel free to contact me if you have any questions or concerns.    Molli Hazard FNP-C

## 2016-07-02 ENCOUNTER — Ambulatory Visit
Admission: RE | Admit: 2016-07-02 | Discharge: 2016-07-02 | Disposition: A | Discharge status: Home / Self Care | Payer: PRIVATE HEALTH INSURANCE | Source: Ambulatory Visit | Attending: Podiatry | Admitting: Podiatry

## 2016-07-02 ENCOUNTER — Encounter: Payer: Self-pay | Admitting: Podiatry

## 2016-07-02 ENCOUNTER — Ambulatory Visit: Payer: PRIVATE HEALTH INSURANCE | Admitting: Podiatry

## 2016-07-02 VITALS — Ht 76.0 in | Wt 321.0 lb

## 2016-07-02 DIAGNOSIS — E109 Type 1 diabetes mellitus without complications: Secondary | ICD-10-CM

## 2016-07-02 DIAGNOSIS — Z89511 Acquired absence of right leg below knee: Secondary | ICD-10-CM

## 2016-07-02 DIAGNOSIS — M25572 Pain in left ankle and joints of left foot: Secondary | ICD-10-CM

## 2016-07-02 MED ORDER — DICLOFENAC SODIUM 1 % EX GEL *I*
CUTANEOUS | 1 refills | Status: DC
Start: 2016-07-02 — End: 2016-09-09

## 2016-07-02 NOTE — Progress Notes (Signed)
O&P AUTHORIZATION REQUEST FORM    Name: Anna Ford  DOB:  1965/10/12    Patient Activity Level:  Low/moderate  Side:  right    Diagnosis:      ICD-10-CM ICD-9-CM   1. S/P BKA (below knee amputation), right Z89.511 V49.75   2. Type I diabetes mellitus E10.9 250.01       Repairs/Adjustments made to:  Ott[employer]ock HD Harmony Vacuum unit    Need for adjustment/replacement/repairs at this time due to:   Other: irrepairable damage due to everyday use    Code Selection:    [facility] Code L-Code # Long Description Cost          [id number] [phone] 1 Repair of prosthetic device, replace minor parts $1032.50              Initials:  stb  I have reviewed and approve the codes above based on my treatment recommendation     Justification:       [employer] Harmony HD vacuum unit requires repairs to return device to appropriate and safe function.

## 2016-07-02 NOTE — Progress Notes (Signed)
Chief Complaint   Patient presents with    New Patient Visit    Diabetes     foot exam    Ankle Pain       HPI:  Presents today with a concern of left ankle pain.  She states she has started to develop pain to the left ankle over the last 2-3 months.  She believes it is because she has been slightly more active and has been doing a lot of stairs.  She denies any edema.  She states the pain is an aching and throbbing type of pain.  She describes it as a 5/10 on a verbal numeric pain scale.  She has not tried any treatment.  However she does wear an AFO to the left foot.  She has a complex past history.     She has history of juvenile onset diabetes.  She has history of right below-knee amputation and left transmetatarsal amputation associated with ulceration and osteomyelitis.  She does have a prosthesis for the right leg and is currently been wearing a rigid AFO left since 2000.    She notes she has some difficulty controlling her blood glucose and today her blood glucose was 182 mg/dL.  Her last hemoglobin A1c was 11%.    Review of systems:  Musculoskeletal:   Musculoskeletal: Positive for multiple joint pain and swelling.  Neurologic: Positive for numbness in her feet  Past medical history, Medications, Allergies, Social history, Surgical history, and Family history were personally reviewed and documented in the chart.    Exam:    Constitutional: Pleasant obese appearing female  Vascular:    Pedal pulses -   2/4 dorsalis pedis pulse left foot.   1/4 posterior tibial pulse left   .  No bounding of pulses.   Edema- Positive pretibial edema left.   no warmth of the left lower extremity.  Musculoskeletal: Transmetatarsal amputation noted left.  Left ankle joint range of motion is decreased to dorsiflexion but is smooth and nonpainful.  She has dorsiflexion +4 with full plantar flexion left.  Limited inversion and eversion of the subtalar joint left.  She is able to plantarflex and dorsiflex with 5/5 muscle  strength testing of the left ankle.  There is diffuse tenderness to palpation of the left anterior ankle joint.  Integument:     skin is intact the left foot without any signs of bacterial infection.  No ulceration.  No hyperkeratosis or at risk skin noted.    Neurologic:     absent Phoebe PerchSemmes Weinstein protective sensation to the left foot.  Psychiatric:      AAOx3      Appears of normal mood and affect   Labs/Radiographs: Please see x-ray report    Assessment and Plan:    1. Acute left ankle pain  * Ankle LEFT standard AP, lateral, mortise views    diclofenac (VOLTAREN) 1 % gel     Radiographs do not demonstrate any evidence of acute Charcot.  Her AFO was reviewed and appears appropriate.  This likely represents anterior ankle joint pain secondary to increased activities.  She is agreeable to decreasing her activities and continuing with her AFO.  She was given a prescription for Voltaren gel to help reduce pain and inflammation.  Should this not be adequate she will follow up for further evaluation.    Reviewed diabetic foot health, care, and precautions.  Reviewed monitoring left foot for any skin breakdown or sign of infection.

## 2016-07-02 NOTE — Procedures (Signed)
Date: 07/02/16    [facility] [address]  [facility]  [address]  [place] [id number]  Dept: [phone]    Number of Views  3 Views      Reason for the exam: Pain    Impression: Transmetatarsal amputation noted.  No fracture noted.  Diffuse osteopenia.  No periarticular fragmentation.  Dorsal spurring of the navicular and navicular cuneiform joints consistent with history of consolidated Charcot deformity.  No acute Charcot changes noted.    Barnett ApplebaumMichael Damyia Strider, DPM

## 2016-07-09 ENCOUNTER — Telehealth: Payer: Self-pay

## 2016-07-09 DIAGNOSIS — Z89511 Acquired absence of right leg below knee: Secondary | ICD-10-CM

## 2016-07-09 DIAGNOSIS — Z89512 Acquired absence of left leg below knee: Secondary | ICD-10-CM

## 2016-07-09 NOTE — Telephone Encounter (Signed)
Please generate a referral to Orthotics & Prosthetics for    Repair of Prosthetic device  ottobock hd harmony vacuum unit  Dx G95.621Z89.511

## 2016-07-11 ENCOUNTER — Encounter: Payer: Self-pay | Admitting: Nurse Practitioner

## 2016-07-11 ENCOUNTER — Ambulatory Visit
Admission: AD | Admit: 2016-07-11 | Discharge: 2016-07-11 | Disposition: A | Discharge status: Home / Self Care | Payer: PRIVATE HEALTH INSURANCE | Source: Ambulatory Visit | Attending: Urgent Care | Admitting: Urgent Care

## 2016-07-11 DIAGNOSIS — J069 Acute upper respiratory infection, unspecified: Secondary | ICD-10-CM

## 2016-07-11 DIAGNOSIS — B9789 Other viral agents as the cause of diseases classified elsewhere: Secondary | ICD-10-CM

## 2016-07-11 MED ORDER — IPRATROPIUM BROMIDE 0.02 % IN SOLN *I*
500.0000 ug | Freq: Once | RESPIRATORY_TRACT | Status: AC
Start: 2016-07-11 — End: 2016-07-11
  Administered 2016-07-11: 500 ug via RESPIRATORY_TRACT

## 2016-07-11 MED ORDER — ALBUTEROL SULFATE (2.5 MG/3ML) 0.083% IN NEBU *I*
2.5000 mg | INHALATION_SOLUTION | Freq: Once | RESPIRATORY_TRACT | Status: AC
Start: 2016-07-11 — End: 2016-07-11
  Administered 2016-07-11: 2.5 mg via RESPIRATORY_TRACT

## 2016-07-11 MED ORDER — ALBUTEROL SULFATE HFA 108 (90 BASE) MCG/ACT IN AERS *I*
2.0000 | INHALATION_SPRAY | RESPIRATORY_TRACT | 0 refills | Status: AC | PRN
Start: 2016-07-11 — End: 2016-08-10

## 2016-07-11 MED ORDER — BENZONATATE 200 MG PO CAPS *I*
200.0000 mg | ORAL_CAPSULE | Freq: Three times a day (TID) | ORAL | 0 refills | Status: DC | PRN
Start: 2016-07-11 — End: 2016-08-11

## 2016-07-11 NOTE — Discharge Instructions (Signed)
Rest and drink plenty of fluids.  Run a humidifier in your bedroom at night  Wash hands frequently to prevent the spread of infection.    Motrin or tylenol every 6 hours as needed for pain.    Mucinex 600mg can help to break up mucous and may be easier to cough it up. You can take this every 12 hours for the next 3-5 days.     Tessalon as needed for cough.    Warm salt water gargles and hot tea for a sore throat.  Throat lozengers as needed.    Flonase nasal spray is helpful for nasal congestion and post nasal drip     Saline nasal spray every 4 hours for the next 2-5 days. Netti Pot Sinus rinse can be helpful. Use up to twice daily- once in the morning and once before bed to help with nasal congestion.    Shower steam can be helpful to loosen any mucous and relief congestion. You can try this before bed, or when you get up in the morning.    Elevate your head at night with an extra pillow to help with congestion/reducing cough.    Please seek medical attention if you develop worsening sore throat, fevers, chills, shortness of breath, difficulty breathing,

## 2016-07-11 NOTE — UC Provider Note (Signed)
History     Chief Complaint   Patient presents with    Cough     Pt. c/o cough, sinus congestion, and headache since Tuesday     HPI Comments: 51 year old female with past medical history significant for diabetes, hypertension, chronic kidney disease presents with cough and congestion.  Patient reports chills and fatigue began 6 days ago.  Reports sinus congestion, cough and body aches began 5 days ago.  Reports increased sinus pressure and yellow nasal discharge.  Reports bilateral earache, sore throat and cough.  States cough is primarily dry.  Reports her chest is tight.  Patient did not check her temperature.  States she's been using over-the-counter medications with mild effect.      History provided by:  Patient  Language interpreter used: No        Past Medical History:   Diagnosis Date    Autonomic dysreflexia     CKD (chronic kidney disease) stage 4, GFR 15-29 ml/min     Depression     DM type 1 (diabetes mellitus, type 1)     HLD (hyperlipidemia)     Hypertension     Morbid obesity with BMI of 40.0-44.9, adult     OSA on CPAP     Osteomyelitis     SVT (supraventricular tachycardia)     s/p ablation in 2002    Syncope and collapse 01/2016    x2     Thyroid disease     hypothyroid            Past Surgical History:   Procedure Laterality Date    BREAST BIOPSY Right 2000    neg. exc. bx    cataracts Bilateral     left foot amputation Left     broke foot, never healed    LEG AMPUTATION BELOW KNEE Right     THYROIDECTOMY         Family History   Problem Relation Age of Onset    Heart Murmur Mother     Heart Disease Mother      mitral valve prolapse-reported from pt    Breast cancer Maternal Aunt          Social History    reports that she has never smoked. She has never used smokeless tobacco. She reports that she drinks alcohol. She reports that she does not use illicit drugs. Her sexual activity history is not on file.    Living Situation     Questions Responses    Patient lives with      Comment: staying with her aunt     Homeless     Caregiver for other family member     External Services     Employment Employed    Comment: Social research officer, government at [employer] Violence Risk           Review of Systems   Review of Systems   Constitutional: Positive for chills. Negative for fever.   HENT: Positive for congestion, ear pain, rhinorrhea and sinus pressure. Negative for ear discharge, trouble swallowing and voice change.    Eyes: Negative for redness.   Respiratory: Positive for cough, chest tightness and shortness of breath.    Cardiovascular: Negative for chest pain.   Gastrointestinal: Negative for nausea and vomiting.   Skin: Negative for wound.   Allergic/Immunologic: Negative for immunocompromised state.   Neurological: Negative for seizures.   Psychiatric/Behavioral: Negative for behavioral problems.  Physical Exam     ED Triage Vitals   BP Heart Rate Heart Rate (via Pulse Ox) Resp Temp Temp src SpO2 (Retired) O2 Device O2 Flow Rate   07/11/16 1616 07/11/16 1616 -- 07/11/16 1616 07/11/16 1616 07/11/16 1616 07/11/16 1616 -- --   152/68 112  22 36.5 C (97.7 F) TEMPORAL 97 %        Weight           --                               Physical Exam   Constitutional: She appears well-developed. No distress.   HENT:   Head: Atraumatic.   Right Ear: Tympanic membrane, external ear and ear canal normal.   Left Ear: Tympanic membrane, external ear and ear canal normal.   Nose: Nose normal.   Mouth/Throat: Oropharynx is clear and moist. No oropharyngeal exudate or posterior oropharyngeal erythema.   Postnasal drip   Eyes: Conjunctivae are normal.   Neck: Neck supple. No tracheal deviation present.   Cardiovascular: Normal rate, regular rhythm and normal heart sounds.    Pulmonary/Chest: Effort normal and breath sounds normal. No stridor. No respiratory distress. She has no wheezes. She has no rales.   Decreased airflow   Abdominal: She exhibits no distension.   Musculoskeletal: She exhibits no  edema.   Lymphadenopathy:     She has no cervical adenopathy.   Neurological: She is alert.   Skin: Skin is warm and dry. She is not diaphoretic.   Psychiatric: She has a normal mood and affect. Her behavior is normal. Judgment and thought content normal.   Nursing note and vitals reviewed.       Medical Decision Making        Initial Evaluation:  ED First Provider Contact     Date/Time Event User Comments    07/11/16 1624 ED First Provider Contact Scot JunSAGOLA, Felishia Wartman Initial Face to Face Provider Contact          Patient was seen on: 07/11/2016        Assessment:  51 y.o.female comes [facility] the [facility] with cough, congestion for 5 days    Differential Diagnosis includes   Viral bronchitis  URI  Nasopharyngitis  Nasosinusitis   Postnasal Drip  CAP                 Plan: Symptoms likely viral in nature.  Recommend symptomatic management.  Breath sound globally improved after nebulizer treatment.  Patient reports symptomatic improvement.    Orders Placed This Encounter    albuterol (PROVENTIL) nebulization 2.5 mg    ipratropium (ATROVENT) 0.02 % nebulizer solution 500 mcg    benzonatate (TESSALON) 200 MG capsule    albuterol HFA 108 (90 BASE) MCG/ACT inhaler       Final Diagnosis    ICD-10-CM ICD-9-CM   1. Viral upper respiratory tract infection J06.9 465.9    B97.89        Current Discharge Medication List      New Medications    Details Last Dose Given Next Dose Due Script Given?   benzonatate (TESSALON) 200 mg Dose: 200 mg  Take 200 mg by mouth 3 times daily as needed for Cough    Quantity 20 capsule, Refill 0  Start date: 07/11/2016            albuterol HFA (PROVENTIL, VENTOLIN, PROAIR HFA) 2 puffs Dose: 2  puffs  Inhale 2 puffs into the lungs every 4-6 hours as needed for Wheezing   Shake well before each use.  Quantity 1 Inhaler, Refill 0  Start date: 07/11/2016, End date: 08/10/2016                   Providence Crosby, PA    Supervising physician Harless Litten, MD was immediately available     Providence Crosby, [place]  07/11/16 [phone]

## 2016-07-13 ENCOUNTER — Telehealth: Payer: Self-pay

## 2016-07-13 NOTE — Addendum Note (Signed)
Addended by: Barnetta ChapelGORSKI, Sia Gabrielsen on: 07/13/2016 12:36 PM     Modules accepted: Orders

## 2016-07-13 NOTE — Telephone Encounter (Signed)
Sent!

## 2016-07-17 ENCOUNTER — Telehealth: Payer: Self-pay | Admitting: Internal Medicine

## 2016-07-17 NOTE — Telephone Encounter (Signed)
Received call from patient regarding having been seen last weekend for URI, Cough.  Patient states she is still having to use inhaler for wheezing, and is still coughing.  Patient states that she continues to cough, and that her back hurts in the thoracic area.  Patient does not think she has fever, but notes joint pain and felt hot/cold earlier today.  Patient requesting f/up appointment next week as recommended in urgent care.  Patient scheduled with Dr. Marjie SkiffBeyers on Tuesday, Jul 21, 2016.

## 2016-07-20 NOTE — Telephone Encounter (Signed)
Telephone call placed to check in with patient following her visit to urgent care, message left. Patient has indeed been indeed been in touch with office to make a follow-up visit, reminded patient of that appointment tomorrow.

## 2016-07-21 ENCOUNTER — Ambulatory Visit: Payer: PRIVATE HEALTH INSURANCE | Admitting: Internal Medicine

## 2016-07-22 ENCOUNTER — Telehealth: Payer: Self-pay

## 2016-07-22 NOTE — Telephone Encounter (Signed)
Telephone call placed, message left to follow up with patient regarding no show for yesterday's appointment.  Additionally looking to clarify whether patient is changing PCP practices, as [facility]. Message left to please return call.

## 2016-08-10 ENCOUNTER — Ambulatory Visit: Payer: PRIVATE HEALTH INSURANCE | Attending: Endocrinology | Admitting: Endocrinology

## 2016-08-10 VITALS — BP 186/88 | HR 81 | Ht 76.0 in | Wt 321.0 lb

## 2016-08-10 DIAGNOSIS — IMO0002 Reserved for concepts with insufficient information to code with codable children: Secondary | ICD-10-CM

## 2016-08-10 DIAGNOSIS — E1065 Type 1 diabetes mellitus with hyperglycemia: Secondary | ICD-10-CM

## 2016-08-10 LAB — POCT HEMOGLOBIN A1C: Hemoglobin A1C,POC: 11.9 % — ABNORMAL HIGH (ref 4.0–6.0)

## 2016-08-10 NOTE — Patient Instructions (Addendum)
Enter carbohydrate amount and blood glucose into the pump before meal in order to get the right bolus amount  Check blood sugar fasting in the morning, before lunch, before dinner and at bedtime  Call [employer] to locate sensor  Go to the lab to have thyroid blood work done  Make an appointment for 3 months
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
## 2016-08-12 ENCOUNTER — Encounter: Payer: Self-pay | Admitting: Internal Medicine

## 2016-08-12 ENCOUNTER — Ambulatory Visit: Payer: PRIVATE HEALTH INSURANCE | Attending: Cardiology | Admitting: Internal Medicine

## 2016-08-12 VITALS — BP 128/70 | HR 80 | Ht 76.0 in | Wt 325.0 lb

## 2016-08-12 DIAGNOSIS — G904 Autonomic dysreflexia: Secondary | ICD-10-CM

## 2016-08-12 DIAGNOSIS — I10 Essential (primary) hypertension: Secondary | ICD-10-CM

## 2016-08-12 DIAGNOSIS — R55 Syncope and collapse: Secondary | ICD-10-CM

## 2016-08-12 MED ORDER — OMEPRAZOLE 20 MG PO CPDR *I*
20.0000 mg | DELAYED_RELEASE_CAPSULE | Freq: Every evening | ORAL | 3 refills | Status: AC
Start: 2016-08-12 — End: ?

## 2016-08-12 NOTE — Progress Notes (Addendum)
Cardiology Consult Visit     Dear Windy Canny, MD,PHD,    I had the pleasure of seeing your patient, Anna Ford, in our [facility] at [facility] office on 08/12/2016.    HPI:  Anna Ford is a 51 y.o. 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     She experienced several episodes of presyncope since our last follow up. She was visiting [facility] with her mother and after getting off the bus and and walking around she developed presyncope, diaphoresis and palpations. BG were not low. EMS was called and her BP was noted to be 90/38. She was taken to the ED and given IVF which improved her symptoms. She again had an episode on Mother's day and had to lie on the ground before she synopsized. She did wear an ambulatory BP monitor, which during this time showed mostly stage 2 hypertension and significant variability. She continues to battle for glycemic control. Lately she also noted several episodes of 'acid reflux' symptoms of burning in her epigastric area through to her back upon waking. Improved with OTC Prilosec and changing position from lying to sitting.     PAST MEDICAL HISTORY:  Past Medical History:   Diagnosis Date    Autonomic dysreflexia     CKD (chronic kidney disease) stage 4, GFR 15-29 ml/min     Depression     DM type 1 (diabetes mellitus, type 1)     HLD (hyperlipidemia)     Hypertension     Morbid obesity with BMI of 40.0-44.9, adult     OSA on CPAP     Osteomyelitis     SVT (supraventricular tachycardia)     s/p ablation in 2002    Syncope and collapse 01/2016    x2     Thyroid disease     hypothyroid     REVIEW OF SYSTEMS:  10 point review of systems otherwise negative except as per  HPI.    ALLERGIES:  Allergies   Allergen Reactions    Travatan Z [Travoprost] Other (See Comments)           CURRENT MEDICATIONS:  Current Outpatient Prescriptions on File Prior to Visit   Medication Sig Dispense Refill    diclofenac (VOLTAREN) 1 % gel Apply 2 g to left ankle 2  times daily. 100 g 1    felodipine (PLENDIL) 5 MG 24 hr tablet Take 1 tablet (5 mg total) by mouth daily   Swallow whole. Do not crush, break, or chew. 30 tablet 5    levothyroxine (SYNTHROID, LEVOTHROID) 100 MCG tablet Take 1 tablet (100 mcg total) by mouth daily (before breakfast)   Take 1 100 mcg tablet by mouth on Saturdays and Sundays. 30 tablet 1    lisinopril (PRINIVIL,ZESTRIL) 20 MG tablet Take 1 tablet (20 mg total) by mouth daily 30 tablet 5    insulin aspart (NOVOLOG) 100 UNIT/ML injection vial Maximum 150 Units/day 60 mL 3    blood glucose (ONETOUCH VERIO) test strip Test 4 times a day.   Brand name of stripes verio 120 strip 11    insulin glargine (LANTUS) 100 UNIT/ML injection vial Inject 70 Units into the skin nightly (Patient taking differently: Inject 70 Units into the  skin nightly   Incase of pump failure) 5 mL 5    furosemide (LASIX) 40 MG tablet Take 1 tablet (40 mg total) by mouth 2 times daily 60 tablet 5    escitalopram (LEXAPRO) 20 MG tablet Take 1 tablet (20 mg total) by mouth daily 30 tablet 5    rosuvastatin (CRESTOR) 40 MG tablet Take 1 tablet (40 mg total) by mouth daily (with dinner) 30 tablet 5    levothyroxine (SYNTHROID, LEVOTHROID) 125 MCG tablet Take 2 tablets (250 mcg total) by mouth daily (before breakfast) 60 tablet 5    other supply Dispense CPAP supplies and accessories 1 each 0    Cholecalciferol (VITAMIN D-3 PO) Take by mouth      aspirin 81 MG tablet Take 81 mg by mouth daily      cetirizine (ZYRTEC) 10 MG tablet Take 10 mg by mouth daily      fluticasone (FLONASE) 50 MCG/ACT nasal spray 1 spray by Nasal route daily      montelukast (SINGULAIR) 10 MG tablet Take 10 mg by mouth  nightly       No current facility-administered medications on file prior to visit.      VITALS: BP 128/70   Pulse 80   Ht 1.93 m (6\' 4" )   Wt (!) 147.4 kg (325 lb)   SpO2 98%   BMI 39.56 kg/m2  PHYSICAL EXAM:  CONSTITUTIONAL: Pleasant and interactive  NEURO: Alert and oriented in no acute distress  HENT: MMM, oropharynx clear, anicteric sclerae  LYMPH: Supple without lymphadenopathy  NECK VASCULAR: JVP is approximately 6 cm above the right atrium.  There are no carotid bruits appreciated bilaterally.  CARDIOVASCULAR: A comprehensive cardiovascular exam was performed with details as follows: Regular rhythm, normal S1, prominent S2 no S3 or S4, there are no murmurs or rubs appreciated. PMI is normal in position in character.  2+ radial and dorsalis pedis pulses with no lower extremity edema noted.  PULM: Lungs are clear to auscultation bilaterally without wheezes, rales, or rhonchi.  GI: Soft, nontender, normal active bowel sounds, no abdominal bruits noted.  MSK/EXTREMITIES: No clubbing, cyanosis, and edema as noted above. Left leg BKA with prosthesis. right leg in brace up to knee. Multiple scars on forearms and LE. Ambulates with a cane     LABS:  Lab Results   Component Value Date/Time    CHOL 147 12/31/2015 05:21 AM    HDL 49 12/31/2015 05:21 AM    LDLC 70 12/31/2015 05:21 AM     Lab Results   Component Value Date/Time    NA 133 05/29/2016 07:08 PM    K 4.9 05/29/2016 07:08 PM    AST 21 01/16/2016 03:59 PM    AST 24 12/30/2015 12:27 PM    ALT 24 01/16/2016 03:59 PM    ALT 26 12/30/2015 12:27 PM     ECG:  NSR HR 77, normal axis, possible RA enlargement.     CARDIAC STUDIES:    Ambulatory BP monitor   TOTAL TIME DEVICE WORN:  23 hours and 59 minutes  TIME PERIOD:  06/26/2016 to 06/27/2016     Mean Systolic (mmHg) Mean Diastolic (mmHg) Mean Heart Rate (bpm)   Overall 161 (+/-26.4 74 (+/-16.7 84 (+/-10.5   Awake 165 (+/-26.8 77 (+/-17.3 86 (+/-11.2   Asleep 153 (+/-23.8 66 (+/-12.3 80 (+/-7.0     % of systolic  readings above goal (>140 mmHg while awake, >120 while asleep):  85%    % of diastolic  readings above goal (>90 mmHg while awake, >80 while asleep):  17%    SUMMARY:  1. Stage 2 systolic hypertension during typical waking hours.  2. Stage 2 systolic hypertension during typical sleeping hours.  3. No hypotensive episodes.  4. Normal heart rate variability.    TTE 12/31/15   Moderately hypertrophied left ventricle with normal systolic function and no segmental wall motion abnormalities. Estimated LVEF is 65-70%.   Left ventricular diastolic function is abnormal, grade 1 (mild).   The size of the right ventricular cavity is within normal limits. RV ejection fraction is normal.   No significant valvular abnormalities.   The estimated RV systolic pressure is within normal limits (24.1 mmHg).   The patient did not have a prior exam to compare this exam's results to.    ASSESSMENT AND RECOMMENDATIONS:  51 yo woman with a history of HTN, HLD, type I DM since age 72 with peripheral neuropathy and s/p left BKA, autonomic dysreflexia, OSA compliant with CPAP, CKD III, Hashimoto's thyroiditis s/p thyroidectomy, SVT s/p ablation in [place] in 2002, prior BKA who presents today for follow up of her orthostatic presyncope related to her autonomic dysfunction. She continues to have symptomatic hypotension on reduced doses of antihypertensives. A recent ambulatory BP monitor revealed mostly high readings (stage 2 HTN).    Presyncope, Syncope: Autonomic dysreflexia (in the setting of DM and severe hyperglycemia/dehydration)   - discontinue coreg  - event monitor negative for arrhythmogenic cause  - continue compression stocking of left leg  - avoiding midodrine due to sodium retention and baseline hypertensive state     HTN/HLD: above goal, currently allowing some permissive hypertension in the setting of syncope.   - continue lisinopril to 20 mg daily and felodipine 2.5 mg daily.   -Her ambulatory BP showed hypertension with  significant variation. However, while she did not have any lower BP readings during this monitoring period she continues to have frequent presyncope (twice in the past 2 months, one resulting in hospitalization) and symptomatic hypotension (SBP <120's). We are in a difficult position. For CVD risk her BP is not well controlled, however we accept the higher BP while she continues to have symptomatic lows. If her syncope and presyncope improve we can consider adjusting her antihypertensives.  - continue Crestor.     OSA compliant with CPAP     Likely GERD  -Prilosec qhs. If symptoms do not improve, change or accelerate in frequency she will call the office for an appointment. Given her CVD risk, micro or macrovascular coronary obstruction is a consideration.     RTC in 6 months for BP check     As always, please do not hesitate to contact us if questions or issues arise.  I hope this has been helpful to you and I look forward to continuing to work with you in the future.      Sincerely,    Katrinka Blazing, MD  Cardiology Fellow  [facility] at [facility]        Cardiology Attending Addendum  Patient seen and evaluated with Dr Luisa Hart- I agree with the above findings and assessment. Note edited as appropriate. Ernestene Rhodesis a 51 y.o.femalewith complications of longstanding DM1 and morbid obesity that was seen today for follow-up. She continues to have orthostatic symptoms secondary to autonomic insufficiency. Will allow leniency given frequent orthostatic symptoms.     Theressa Stamps, MD

## 2016-08-13 ENCOUNTER — Ambulatory Visit: Payer: PRIVATE HEALTH INSURANCE | Attending: Internal Medicine | Admitting: Internal Medicine

## 2016-08-13 ENCOUNTER — Other Ambulatory Visit
Admission: RE | Admit: 2016-08-13 | Discharge: 2016-08-13 | Disposition: A | Discharge status: Home / Self Care | Payer: PRIVATE HEALTH INSURANCE | Source: Ambulatory Visit | Attending: Endocrinology | Admitting: Endocrinology

## 2016-08-13 VITALS — BP 146/92 | HR 90 | Ht 76.0 in | Wt 331.1 lb

## 2016-08-13 DIAGNOSIS — G904 Autonomic dysreflexia: Secondary | ICD-10-CM

## 2016-08-13 DIAGNOSIS — Z89432 Acquired absence of left foot: Secondary | ICD-10-CM | POA: Insufficient documentation

## 2016-08-13 DIAGNOSIS — E109 Type 1 diabetes mellitus without complications: Secondary | ICD-10-CM

## 2016-08-13 DIAGNOSIS — I5030 Unspecified diastolic (congestive) heart failure: Secondary | ICD-10-CM

## 2016-08-13 DIAGNOSIS — IMO0002 Reserved for concepts with insufficient information to code with codable children: Secondary | ICD-10-CM

## 2016-08-13 DIAGNOSIS — N184 Chronic kidney disease, stage 4 (severe): Secondary | ICD-10-CM

## 2016-08-13 DIAGNOSIS — I10 Essential (primary) hypertension: Secondary | ICD-10-CM

## 2016-08-13 DIAGNOSIS — E039 Hypothyroidism, unspecified: Secondary | ICD-10-CM

## 2016-08-13 DIAGNOSIS — E1065 Type 1 diabetes mellitus with hyperglycemia: Secondary | ICD-10-CM | POA: Insufficient documentation

## 2016-08-13 LAB — T4, FREE: Free T4: 1 ng/dL (ref 0.9–1.7)

## 2016-08-13 LAB — TSH: TSH: 5.85 u[IU]/mL — ABNORMAL HIGH (ref 0.27–4.20)

## 2016-08-13 NOTE — Patient Instructions (Addendum)
Call to schedule a diabetic eye exam, its VERY important: [phone]            Chondromalacia Patella    About this topic   Your kneecap is also called the patella. It is part of the knee joint. It is shaped like a triangle and protects the front of the knee. The strong muscles of the front of the thigh attach to the patella. The patella helps these muscles straighten the knee. The bottom surface of the patella has a layer of cartilage on it. Cartilage is a smooth tissue. It helps the patella glide over a groove in the thigh bone when you straighten your knee. When the cartilage breaks down on the back of the patella it is called chondromalacia patella. Most of the time, people with this problem do not need surgery. If surgery is needed, there are most often good results.       What are the causes?   Cartilage on the back of the kneecap gets worn because:   Injury, accident, repeated stress, or fall   Patella is not lined up in the right place   Muscles in the thigh are not balanced. One set might be weaker than the other set, like tight hamstrings or weak quadriceps.   Born with a problem with how the kneecap lines up  What can make this more likely to happen?   You are more likely to have this problem if you are in your teens or are a young adult. Females are more likely to have problems than males. Activities like running, jumping, skiing, soccer, and twisting can also make this condition more likely. Being overweight or having other problems with your knees also raises your chances.   What are the main signs?    Pain in the front of knee that is worse:   After getting up when sitting for a long time   Going up and down the steps   When squatting or kneeling   Grinding or cracking when the knee is bending   Soreness when touching the knee  How does the doctor diagnose this health problem?   Your doctor will feel around your knee. Your doctor will check the strength in your leg by pushing and pulling  on your leg. The doctor will have you bend and straighten your knee while keeping a hand over your kneecap to feel for grinding.   How does the doctor treat this health problem?    Rest. Avoid activities that make the problem worse.   Ice   Keeping the knee raised  Brace or taping the kneecap        Chondromalacia Patella Exercises    About this topic   Your kneecap is also called the patella. It is shaped like a triangle and protects the front of the knee. The strong muscles on the front of the thigh attach to the kneecap. The kneecap helps these muscles straighten the knee. The bottom surface of the kneecap has a layer of cartilage on it. Cartilage is a smooth tissue. It helps the kneecap glide over a grove in the thigh bone when you straighten your knee. When the cartilage breaks down on the back of the kneecap, it is called chondromalacia patella.   If you have this problem, you may not want to do things like run, squat, kneel, or jump. You may also want to limit how often you go up and down the steps. Your doctor may want you  to do exercises to help ease your pain and make this problem better.  General   Before starting with a program, ask your doctor if you are healthy enough to do these exercises. Your doctor may have you work with a trainer or physical therapist to make a safe exercise program to meet your needs.  Stretching Exercises   Stretching exercises keep your muscles flexible. They also stop them from getting tight. Start by doing each of these stretches 2 to 3 times. In order for your body to make changes, you will need to hold these stretches for 20 to 30 seconds. Try to do the stretches 2 to 3 times each day. Do all exercises slowly.   Hamstring stretches seated ? Sit up straight on the edge of a chair. Make sure you keep your back straight. Straighten your knee on your left leg. Keep your heel on the floor. Bend forward at the waist towards your foot while keeping your upper back straight.  Bend forward until you feel a stretch in the back of your thigh. Repeat on the other leg.   Thigh stretches standing ? Stand close to a wall or chair for balance. Bend one knee up and grab your foot behind you with the hand on the same side. Pull your foot closer to your back while bringing the hip backwards. You should feel a stretch at the front of your thigh, hip, and knee. If you have trouble with balance, you can do this stretch by lying on your side and bending the top leg back.   Iliotibial band stretches:   To stretch the left IT band: Stand up with your right leg crossed over the left one. Try to point the toes of the front leg outwards. This is a very awkward position. Lean your upper body towards the right while bending your right knee. You should feel a stretch near the left hip. Hold and repeat.   To stretch the right IT band: Stand up with your left leg crossed over the right one. Try to point the toes of the front leg outwards. This is a very awkward position. Lean your upper body towards the left while bending your left knee. You should feel a stretch near the right hip. Hold and repeat.  Strengthening Exercises   Strengthening exercises keep your muscles firm and strong. Stand or sit up tall on a chair without arms for your exercises. Start by repeating each exercise 2 to 3 times. Work up to doing each exercise 10 times. Try to do the exercises 2 to 3 times each day. Do all exercises slowly.   Straight leg raises ? Lie on your back with your sore leg straight. Bend your other knee so the foot is flat on the bed. Keeping your sore leg straight, lift it up to the level of your other knee. Slowly, lower it back down.    Lower leg lifts with towel ? Lie on your back with your knee slightly bent and foot flat on the bed. Roll up a large towel and put it under your sore knee. Tighten your front thigh muscles and lift the lower leg off the bed until it as straight as possible.   Wall squats ? Stand  near a wall with the ball between your back and the wall. Bend your knees. Return to standing. You can make this exercise harder by bending deeper or squatting with only one leg.  What will the results be?    Less pain and swelling   Better range of motion   More strength   Less grinding or cracking noises   Easier to walk and do other activities  Helpful tips    Stay active and work out to keep your muscles strong and flexible.   Keep a healthy weight so there is not extra stress on your joints. Eat a healthy diet to keep your muscles healthy.   Be sure you do not hold your breath when exercising. This can raise your blood pressure. If you tend to hold your breath, try counting out loud when exercising. If any exercise bothers you, stop right away.   Always warm up before stretching. Heated muscles stretch much easier than cool muscles. Stretching cool muscles can lead to injury.   Try walking or cycling at an easy pace for a few minutes to warm up your muscles. Do this again after exercising.   Never bounce when doing stretches.   After exercising, it is a good idea to use ice. Place an ice pack or a bag of frozen peas wrapped in a towel over the painful part. Never put ice right on the skin. Do not leave the ice on more than 10 to 15 minutes at a time. Ice after activity may help decrease pain and swelling. Never ice before stretching.   Doing exercises before a meal may be a good way to get into a routine.   Exercise may be slightly uncomfortable, but you should not have sharp pains. If you do get sharp pains, stop what you are doing. If the sharp pains continue, call your doctor.  Where can I learn more?   [employer]  http://orthoinfo.aaos.org/topic.cfm?topic=A00382   Last Reviewed Date   2013-04-10  Consumer Information Use and Disclaimer   This information is not specific medical advice and does not replace information you receive from your health care  provider. This is only a brief summary of general information. It does NOT include all information about conditions, illnesses, injuries, tests, procedures, treatments, therapies, discharge instructions or life-style choices that may apply to you. You must talk with your health care provider for complete information about your health and treatment options. This information should not be used to decide whether or not to accept your health care providers advice, instructions or recommendations. Only your health care provider has the knowledge and training to provide advice that is right for you.  Copyright   Copyright  2015 [facility] [employer]. and its affiliates and/or licensors. All rights reserved.      Inserts for your shoes   Exercises for stretching and strengthening   Surgery   Are there other health problems to treat?   If you are overweight, losing weight will help take strain off of your knees.  What drugs may be needed?   The doctor may order drugs to:   Help with pain and swelling  What can be done to prevent this health problem?    Stay active and work out to keep your muscles strong and flexible.   Warm up slowly and stretch your muscles before you work out. Use good ways to train, such as slowly adding to how far you run. Do not work out if you are overly tired. Take extra care if working out in cold weather.   Take breaks often when doing things that use repeat movements.   Avoid running on  hard or uneven surfaces.   Wear shoes with good support. Do not go barefoot.   Keep a healthy weight so there is not extra stress on your joints. Eat a healthy diet to keep your muscles healthy.  Helpful tips    Try swimming for exercise to have less impact on the knee.   Avoid running down hills. Walk down instead or try running in a zigzag pattern to lessen the stress on the front of the knee.   If going up and down stairs is painful, try going up or down sideways until  the pain lessens.  Where can I learn more?   [employer]  https://www.gonzalez.com/http://kidshealth.org/teen/food_fitness/sports/runners_knee.html   Last Reviewed Date   2013-03-22  Consumer Information Use and Disclaimer   This information is not specific medical advice and does not replace information you receive from your health care provider. This is only a brief summary of general information. It does NOT include all information about conditions, illnesses, injuries, tests, procedures, treatments, therapies, discharge instructions or life-style choices that may apply to you. You must talk with your health care provider for complete information about your health and treatment options. This information should not be used to decide whether or not to accept your health care providers advice, instructions or recommendations. Only your health care provider has the knowledge and training to provide advice that is right for you.  Copyright   Copyright  2015 [facility] [employer] and its affiliates and/or licensors. All rights reserved.

## 2016-08-13 NOTE — Progress Notes (Signed)
[facility] Academic Practice Note      Chief Complaint: Follow up     HPI:  Anna Ford is a 51 y.o. female with PMH significant for DMT1, CKD IV, HFpEF,HTN, hypothyroidism who presents for routine follow up. She was last seen in the office on 06/24/16 for post-ED follow up, no medication changes were made at that time. She was seen by Cardiology yesterday, allowing for some permissive HTN d/t syncopal events, and she was started on Prilosec for GERD. She was seen by Endocrinology on 08/10/16 without any medication changes.     Pt reports recent visit to [facility] in [place] for bronchitis/conjunctivitis/UTI about 1 month ago. Still with cough and congestion, but starting to feel better. Was prescribed two courses of ABx and steroids and albuterol inhaler. Still requiring the albuterol inhaler 1-2x every other day especially after bad coughing fit.     HTN/Autonomic dysregulation: Had an episode of lightheadedness on mother's day, and yesteray a little dizzy off and on. Has not had any bad episodes since last visit here. Currently taking lisinopril, felodipine, and furosemide BID for BP.    Hypothyroidism: Synthroid 250 mcg M-F, and 225 Sat and Sun    DMT1: Waiting on new pump to arrive after a move, and new sensor got sent to old address. She needs to call [employer] to get it sent to the correct address. No changes in DM regimen. Has lantus for back up in pump malfunctions but has not needed it. Has not been using the voltaren gel prescribed by podiatry d/t insurance issues, but plans to pick it up today. Has not yet scheduled an eye exam. Checks foot daily, keeps it well moisturized.     Left knee pain: Has new pain. Started when walking up and down stairs, but not when standing from laying or sitting. Also feels stiff. No swelling. Says she used to have a lot of knee pain in the past prior to her hip replacement in 2011. Pain is now on the front of the knee where the patella is. Pain has been present  for 3-4 months. Thinks its getting worse.     Thinking of moving to [place] in the fall.     Medications:   Current Outpatient Prescriptions   Medication Sig    omeprazole (PRILOSEC) 20 MG capsule Take 1 capsule (20 mg total) by mouth at bedtime    felodipine (PLENDIL) 5 MG 24 hr tablet Take 1 tablet (5 mg total) by mouth daily   Swallow whole. Do not crush, break, or chew.    levothyroxine (SYNTHROID, LEVOTHROID) 100 MCG tablet Take 1 tablet (100 mcg total) by mouth daily (before breakfast)   Take 1 100 mcg tablet by mouth on Saturdays and Sundays.    lisinopril (PRINIVIL,ZESTRIL) 20 MG tablet Take 1 tablet (20 mg total) by mouth daily    insulin aspart (NOVOLOG) 100 UNIT/ML injection vial Maximum 150 Units/day    blood glucose (ONETOUCH VERIO) test strip Test 4 times a day.   Brand name of stripes verio    furosemide (LASIX) 40 MG tablet Take 1 tablet (40 mg total) by mouth 2 times daily    escitalopram (LEXAPRO) 20 MG tablet Take 1 tablet (20 mg total) by mouth daily    rosuvastatin (CRESTOR) 40 MG tablet Take 1 tablet (40 mg total) by mouth daily (with dinner)    levothyroxine (SYNTHROID, LEVOTHROID) 125 MCG tablet Take 2 tablets (250 mcg total) by mouth daily (before breakfast)  other supply Dispense CPAP supplies and accessories    Cholecalciferol (VITAMIN D-3 PO) Take by mouth    aspirin 81 MG tablet Take 81 mg by mouth daily    cetirizine (ZYRTEC) 10 MG tablet Take 10 mg by mouth daily    montelukast (SINGULAIR) 10 MG tablet Take 10 mg by mouth nightly    diclofenac (VOLTAREN) 1 % gel Apply 2 g to left ankle 2  times daily.    insulin glargine (LANTUS) 100 UNIT/ML injection vial Inject 70 Units into the skin nightly (Patient taking differently: Inject 70 Units into the skin nightly   Incase of pump failure)    fluticasone (FLONASE) 50 MCG/ACT nasal spray 1 spray by Nasal route daily     No current facility-administered medications for this visit.       Review of Systems - negative except  as above.     Exam:   Vitals:    08/13/16 1544   BP: (!) 146/92   Pulse: 90   Weight: (!) 150.2 kg (331 lb 1.3 oz)   Height: 1.93 m (6\' 4" )     General: Well appearing.   HEENT: NCAT, MMM, anicteric  Cardiac: RRR no m/r/g  Resp: NWOB, dry cough noted intermittently, CTAB no wheeze or rales or crackles  Ext: LLE with 1+ edema to the knee. Foot examined, s/p trans-metatarsal amputation, well healed, dry skin, no lesions or ulcers.   MSK: left knee without effusion or erythema or warmth. TTP along the patella tendon, no TTP at the joint line. Full ROM.   Neuro: Awake, alert. Face symmetric. Speech and comprehension in tact. Moving all extremities purposefully.     Labs:  All labs reviewed, notable values bellow.     ______________________________________________________________________  Diagnoses and all orders for this visit:    HTN/HFpEF/Autonomic dysreflexia: BP mildly elevated today. Pt continues to have mild pre-syncopal sx. Ambulatory BP without episodes of hypotension, and primarily stage 2 HTN. Agree with cardiology, though tight BP control would be best for her in the long run, will tolerate slightly elevated BPs d/t pre-syncopal sx. Remains on lisinopril, ASA, crestor, lasix, and felodipine.     Type 1 diabetes mellitus: BGs very poorly controlled. Most recent Hgb A1c 11.9 (07/2016) up from 11.7 (05/2016). On pump insulin, has lantus at home as a back up, being managed by endocrine. Encouraged pt to locate new pump, and to call [employer] to send sensor to correct address. Also encouraged her to schedule eye exam, and she was [facility]. DM foot exam with podiatry last month, no concerning features on exam today.     Hypothyroidism: Most recent TSH 0.01 (04/2016) down from 5.44 (12/2015), requires repeat TSH/FT4, will order.     CKD IV: Following with nephrology, next appointment in August. Require repeat labs prior to that appointment, will order BMP at this time.      Follow up in 3 months with new CCP.     Gwynneth AlimentAnat Florena Kozma, MD 08/13/2016 at 5:39 PM   PGY3 Internal Medicine  Pager: [phone]

## 2016-08-16 ENCOUNTER — Encounter: Payer: Self-pay | Admitting: Endocrinology

## 2016-08-26 ENCOUNTER — Ambulatory Visit: Payer: PRIVATE HEALTH INSURANCE | Admitting: Nutrition

## 2016-08-26 MED ORDER — LEVOTHYROXINE SODIUM 125 MCG PO TABS *I*
250.0000 ug | ORAL_TABLET | Freq: Every day | ORAL | 5 refills | Status: AC
Start: 2016-08-26 — End: ?

## 2016-09-03 ENCOUNTER — Telehealth: Payer: Self-pay

## 2016-09-03 ENCOUNTER — Telehealth: Payer: Self-pay | Admitting: Internal Medicine

## 2016-09-03 NOTE — Telephone Encounter (Signed)
Prior Auth needed for Humalog

## 2016-09-03 NOTE — Telephone Encounter (Signed)
Received call from patient regarding recurrent cough and uri sx.  Pt had a serious bough with bronchitis in April and was on steroids and inhalers.  She feels her same sx have returned.  Pt coughing throughout entirety of conversation- pt reports wheezing and some sob.  Pt reports having a fever yesterday of 101.6 and body aches.  She denies chills. Pt reports her face is puffy and swollen- she is worried about her kidney function.     Pt generally feeling unwell, she is currently in [place].  Pt wanted to make appt for tomorrow but it was advised that she not wait to be evaluated due to chest discomfort on the right side and sob.  Pt is nearby a local ED and will be seen there.   advised pt to follow up with her new ccp next week.

## 2016-09-07 ENCOUNTER — Ambulatory Visit: Payer: Self-pay | Admitting: Primary Care

## 2016-09-08 ENCOUNTER — Telehealth: Payer: Self-pay

## 2016-09-08 NOTE — Telephone Encounter (Signed)
-----   Message from Vanetta MuldersShawn Biehler sent at 09/03/2016 12:07 PM EDT -----  Ranelle O[employer]ob[employer] contacted me about [employer]'s loaner vacuum unit, as she has now been in the loaner for longer than they traditionally allow.  A message for her was left by Marchelle FolksAmanda 5/10 to have her call us regarding out of pocket to have her unit repaired.  Can someone please follow up to let her know her out of pocket. I need to [facility] sooner rather than later.    thanks

## 2016-09-08 NOTE — Telephone Encounter (Signed)
LVM FOR PT TO CAL BACK THIS IS URGENT WE NEED TO KNOW IF THIS IS WORKERS COMP OR NOT AND WE NEED HER LOANER LEG BACK ASAP PER BIEHLER

## 2016-09-09 ENCOUNTER — Ambulatory Visit
Payer: 59 | Attending: Student in an Organized Health Care Education/Training Program | Admitting: Student in an Organized Health Care Education/Training Program

## 2016-09-09 VITALS — BP 162/86 | HR 78 | Temp 98.5°F | Ht 76.0 in | Wt 328.1 lb

## 2016-09-09 DIAGNOSIS — R053 Chronic cough: Secondary | ICD-10-CM

## 2016-09-09 DIAGNOSIS — N390 Urinary tract infection, site not specified: Secondary | ICD-10-CM

## 2016-09-09 DIAGNOSIS — R05 Cough: Secondary | ICD-10-CM

## 2016-09-09 NOTE — Progress Notes (Signed)
[facility] Note    Name: Anna Ford   DOB: March 08, 1965   Date: 09/09/2016     Past Medical History: has HTN (hypertension); Hypothyroidism; Type 1 diabetes mellitus; SVT (supraventricular tachycardia); S/P BKA (below knee amputation), right; OSA on CPAP; CKD (chronic kidney disease), stage IV; Autonomic dysreflexia; Morbid obesity; Heart failure with preserved ejection fraction; and S/P transmetatarsal amputation of foot, left on her problem list.     ______________________________________________________________________    HPI/ROS:     Patient reports that since May she has had a cough on and off for a couple weeks at a time. Last Monday, she had a temperature of 101 and the cough returned. She went to the ED on July 5th at [facility] in [place] and was told she had a diagnosis of acute bronchitis. She says the cough is mainly non-productive but not always and she is unsure of what color the sputum is. She also reports headaches (6/10) on and off, slight SOB and fatigue especially with exertion, some slight chest pain under her right breast and back pain. Sometimes if she coughs too hard she gets dizzy but if she remains still for a few seconds the feeling subsides. She denies chills, abdominal pain, nausea/vomitning, diarrhea, aches. She says that sometimes she notices her cough is worse when she is outside or if she has a fan in her face. She has tried tessalon pearls, codeine cough syrup and robitussin DM with no relief. She says that the wheezing keeps her up more than the cough. She was treated with prednisone with the first incident but two days after completing the steroids her symptoms came back. The second time she ws treated with steroids and azithromycin with complete resolution of symptoms but two weeks later the symptoms came back. This time she was started on a prednisone taper on 7/6. Her fever has resolved but the coughing has persisted. With being sick and on and off prednisone her sugars have been hard to  control. She denies any recent episodes of hypoglycemia. She reports about 2 episodes of hyperglycemia daily (once in the am and once in the evening) where her heart will start racing or she feels dizzy. Her sugar is usually between 200-350 when she feels symptomatic. When this happens she corrects her sugar with humalog.    When she was in the ED they did a UA and culture of her urine and found a urinary tract infection. She was called and informed that she has an antibiotic resistant bacteria. They started Macrobid yesterday. She reports that on and off for the last year-year and a half she has been having recurrent UTI's that get better with ABX (doxycycline, Levaquin, etc.) and then come back once the ABX course is completed. She notes that her urine smells differently when she is infected and that is how she knows she has it. She denies dysuria. But reports that she may also have increased urinary frequency when she is infected. She denies any history of STDs. She denies being sexually active or having any new partners.     ROS negative except for what is mentioned in HPI.   ______________________________________________________________________    Vitals:    09/09/16 1354   BP: 162/86   BP Location: Right arm   Patient Position: Sitting   Cuff Size: large adult   Pulse: 78   Temp: 36.9 C (98.5 F)   TempSrc: Oral   SpO2: 100%   Weight: (!) 148.8 kg (328 lb 1.3 oz)  Height: 1.93 m (6\' 4" )       No results found for this or any previous visit (from the past 24 hour(s)).      Exam:   General: NAD, seated comfortably on exam chair  HEENT: normocephalic, atraumatic, no cervical LAD, MMM  Cardiac: RRR, no m/r/g  Resp: breathing comfortably, lungs CTAB, cough sounds mildly wheezy   Abd: soft, NT, slightly distended, hypoactive BS, cannot appreciate any hepatosplenomegaly, obese  Ext: WWP, no LE edema, AKA on right and left food amputation.   Neuro: grossly intact  Skin: no rashes, lipoma on L neck near clavicle,  excoriations and scars on upper and lower extremities, scar in midline of chest from previous surgery   ______________________________________________________________________  Assessment: Anna Ford is a 51 y.o. female with a past medical history of HTN, hypothyroidism, type 1 diabetes mellitus on insulin pump, SVT, right BKA, OSA on CPAP, CKD stage IV, autonomic dysreflexia, morbid obesity, heart failure with preserved ejection fraction, and left transmetatarsal amputation who presents for follow up from a recent visit to the ED. Her diagnosis was recurrent acute bronchitis and she was started on a prednisone taper. She was also diagnosed with a antibiotic resistant UTI, for which she was started on Macrobid.     Plan:  Chronic cough:   - differential: post nasal drip/allergies, acid reflux, asthma (reactive airway disease)   - continue prednisone taper   - patient was advised to start taking her Flonase and zyrtec daily     - patient was advised to start taking Prilosec daily   - obtain and review records from recent ED visit at [facility] (will review CXR)   - If the medications do not work within 2 weeks patient was told to call so we can refer her to a pulmonologist for PFTs before her next visit   - Also, consider D/C patients lisinopril     Urinary tract infection   - continue taking Macrobid for now    - obtain and review records from recent ED visit at [facility] (UA and urine cultures)    F/U Visit: 10/15/16 with Dr. Aleda Grana     No diagnosis found.       Medications:   Current Outpatient Prescriptions   Medication Sig    levothyroxine (SYNTHROID, LEVOTHROID) 125 MCG tablet Take 2 tablets (250 mcg total) by mouth daily (before breakfast)    omeprazole (PRILOSEC) 20 MG capsule Take 1 capsule (20 mg total) by mouth at bedtime    diclofenac (VOLTAREN) 1 % gel Apply 2 g to left ankle 2  times daily.    felodipine (PLENDIL) 5 MG 24 hr tablet Take 1 tablet (5 mg total) by mouth daily   Swallow whole. Do not crush,  break, or chew.    lisinopril (PRINIVIL,ZESTRIL) 20 MG tablet Take 1 tablet (20 mg total) by mouth daily    insulin aspart (NOVOLOG) 100 UNIT/ML injection vial Maximum 150 Units/day    blood glucose (ONETOUCH VERIO) test strip Test 4 times a day.   Brand name of stripes verio    insulin glargine (LANTUS) 100 UNIT/ML injection vial Inject 70 Units into the skin nightly (Patient taking differently: Inject 70 Units into the skin nightly   Incase of pump failure)    furosemide (LASIX) 40 MG tablet Take 1 tablet (40 mg total) by mouth 2 times daily    escitalopram (LEXAPRO) 20 MG tablet Take 1 tablet (20 mg total) by mouth daily    rosuvastatin (CRESTOR) 40  MG tablet Take 1 tablet (40 mg total) by mouth daily (with dinner)    other supply Dispense CPAP supplies and accessories    Cholecalciferol (VITAMIN D-3 PO) Take by mouth    aspirin 81 MG tablet Take 81 mg by mouth daily    cetirizine (ZYRTEC) 10 MG tablet Take 10 mg by mouth daily    fluticasone (FLONASE) 50 MCG/ACT nasal spray 1 spray by Nasal route daily    montelukast (SINGULAIR) 10 MG tablet Take 10 mg by mouth nightly     No current facility-administered medications for this visit.         Allergies:   Allergies   Allergen Reactions    Travatan Z [Travoprost] Other (See Comments)

## 2016-09-10 ENCOUNTER — Other Ambulatory Visit: Payer: Self-pay | Admitting: Endocrinology

## 2016-09-11 ENCOUNTER — Telehealth: Payer: Self-pay | Admitting: Endocrinology

## 2016-09-11 MED ORDER — INSULIN ASPART 100 UNIT/ML IJ SOLN *I*
INTRAMUSCULAR | 3 refills | Status: AC
Start: 2016-09-11 — End: ?

## 2016-09-11 NOTE — Telephone Encounter (Signed)
Hunter from covermymeds called stating that a prior authorization is needed for Anna Ford's prescriptions prescribed by Dr. Michail SermonAzim. The reference number is [id number].      He can be reached if necessary at [phone].

## 2016-09-15 NOTE — Telephone Encounter (Signed)
Hunter from Cover [employer] called in regards to Prior Authorization  for Humalog. Writer advised Durene CalHunter of message below  Stating patient switched to Novalog. All set

## 2016-09-22 ENCOUNTER — Telehealth: Payer: Self-pay

## 2016-09-22 NOTE — Telephone Encounter (Signed)
-----   Message from Vanetta MuldersShawn Biehler sent at 09/21/2016 12:38 PM EDT -----  Her [employer] vacuum unit is in.    She can come in as a complex walk in to have it installed

## 2016-09-22 NOTE — Telephone Encounter (Signed)
LVM FOR PT THAT SHE CAN COME IN AS COMPLEX WALK IN FOR NEW VACUUM UNIT SWAP PER BIEHLER

## 2016-10-15 ENCOUNTER — Ambulatory Visit: Payer: PRIVATE HEALTH INSURANCE | Admitting: Student in an Organized Health Care Education/Training Program

## 2016-10-15 ENCOUNTER — Telehealth: Payer: Self-pay

## 2016-10-15 NOTE — Telephone Encounter (Signed)
Telephone call placed, message left to remind patient of this afternoon's appointment.

## 2016-10-28 ENCOUNTER — Ambulatory Visit: Payer: PRIVATE HEALTH INSURANCE | Admitting: Primary Care

## 2016-10-29 ENCOUNTER — Telehealth: Payer: Self-pay | Admitting: Internal Medicine

## 2016-10-29 NOTE — Telephone Encounter (Signed)
-----   Message from Rosendo GrosShri Noel, MD sent at 10/28/2016  5:10 PM EDT -----  Regarding: FW: No show  FYI. If pt calls back, next available. Cannot be scheduled sooner.     ----- Message -----     From: Leavy Cellaomero, Sara     Sent: 10/28/2016   1:33 PM       To: Rosendo GrosShri Noel, MD  Subject: No show                                          Patient was a no show today for NPV.

## 2016-10-29 NOTE — Telephone Encounter (Signed)
Pt 'No Showed" after several attempts to confirm appt. If she calls back reschedule at next available. As of August , NPV are now in April/May. See Dr Netta CedarsNoel's note below.

## 2016-10-30 ENCOUNTER — Ambulatory Visit: Payer: PRIVATE HEALTH INSURANCE | Admitting: Nephrology

## 2016-11-03 ENCOUNTER — Encounter: Payer: Self-pay | Admitting: Nephrology

## 2017-01-16 ENCOUNTER — Telehealth: Payer: Self-pay | Admitting: Student in an Organized Health Care Education/Training Program

## 2017-01-16 ENCOUNTER — Other Ambulatory Visit: Payer: Self-pay | Admitting: Student in an Organized Health Care Education/Training Program

## 2017-01-16 MED ORDER — INSULIN GLARGINE 100 UNIT/ML SC SOLN *WRAPPED*
70.0000 [IU] | Freq: Every evening | SUBCUTANEOUS | 1 refills | Status: DC
Start: 2017-01-16 — End: 2017-01-16

## 2017-01-16 MED ORDER — INSULIN GLARGINE 100 UNIT/ML SC SOPN *I*
70.0000 [IU] | PEN_INJECTOR | Freq: Every evening | SUBCUTANEOUS | 1 refills | Status: AC
Start: 2017-01-16 — End: 2017-02-15

## 2017-01-16 MED ORDER — INSULIN PEN NEEDLE 32G X 6 MM MISC *A*
1 refills | Status: AC
Start: 2017-01-16 — End: ?

## 2017-01-16 MED ORDER — SYRINGE/NEEDLE (DISP) 20G X 1" 10 ML MISC *A*
2 refills | Status: AC
Start: 2017-01-16 — End: ?

## 2017-01-16 NOTE — Telephone Encounter (Signed)
Patient call as her pump stopped working and she has a new pump but unable to tell [employer] rep over the phone her settings so she was unable to start her new pump. She was last seen by is in June and recently found an endocrinologist in [place] where she was not able to get an earlier appoint then 1 month from today. She is a type one diabetes with multiple complications and [facility] Lantus order plus one refill and syringes . She is instructed to take 70 units daily. She states that she has enough novolog and is instructed to take the insulin as she would with her pump with a carbohydrate ratio of 1:3 grams and a correction factor of 1:11mg /dl>140 mg/dl. Patient will need to see her new endocrinologist ASAP.   Barnet GlasgowJonven Maiah Sinning   Endocrine Fellow

## 2017-01-18 ENCOUNTER — Telehealth: Payer: Self-pay | Admitting: Endocrinology

## 2017-01-18 NOTE — Telephone Encounter (Signed)
Patient is calling to get her insulin pump settings so that [employer] can help her get her pump set up.  Patient requesting a call back as soon as possible at [phone].

## 2017-01-26 ENCOUNTER — Encounter: Payer: Self-pay | Admitting: Endocrinology

## 2017-01-26 NOTE — Telephone Encounter (Signed)
Mychart message sent with pump settings from last visit.  12 AM - 3.05 units/hour  6 AM - 3.05 units/hour  8 AM - 3.25 units/hour  14:00 PM - 3.05 units/hour  18:00 PM - 3.25 units/hour  --TDD basal insulin: 75.60 units  -- Active insulin time: 5 hours   -- Insulin:carb ratio: 1:3 grams  -- Sensitivity: 11 mg/dL  -- Target blood glucose: 110 mg/dL

## 2017-02-05 ENCOUNTER — Other Ambulatory Visit: Payer: Self-pay | Admitting: Endocrinology

## 2017-02-17 ENCOUNTER — Ambulatory Visit: Payer: 59 | Admitting: Internal Medicine

## 2017-02-25 ENCOUNTER — Ambulatory Visit: Payer: 59 | Admitting: Endocrinology

## 2017-02-25 ENCOUNTER — Ambulatory Visit: Payer: PRIVATE HEALTH INSURANCE | Admitting: Endocrinology

## 2018-01-20 ENCOUNTER — Telehealth: Payer: Self-pay

## 2018-01-20 NOTE — Telephone Encounter (Signed)
LVM FOR PATIENT ADVISED TO PLEASE GIVE US A CALL BACK.

## 2018-01-24 ENCOUNTER — Telehealth: Payer: Self-pay

## 2018-01-24 NOTE — Telephone Encounter (Signed)
CALLED PATIENT TO SCHEDULE AN APPT AND NO VOICEMAIL IS SET UP.

## 2018-02-08 NOTE — Progress Notes (Signed)
Multiple attempts have been made to have Anna Ford come in to have her repaired [employer]uum unit swapped out for the loaner unit she is currently in.    She originally came in ~1 1/2 years ago.    Due to the numerous attempts and time transpired, her unit will be donated.    Vanetta MuldersShawn Aristotelis Vilardi, MS, [facility]  Orthotics and Prosthetics Department  [phone]

## 2019-05-20 ENCOUNTER — Other Ambulatory Visit: Payer: Self-pay | Admitting: Pulmonary Disease

## 2019-05-20 DIAGNOSIS — Z23 Encounter for immunization: Secondary | ICD-10-CM
